# Patient Record
Sex: Female | Born: 1940 | Race: White | Hispanic: No | Smoking: Former smoker
Health system: Southern US, Community
[De-identification: ages and names within clinical notes are randomized; demographics above are authoritative.]

## PROBLEM LIST (undated history)

## (undated) DIAGNOSIS — I639 Cerebral infarction, unspecified: Secondary | ICD-10-CM

## (undated) DIAGNOSIS — G20C Parkinsonism, unspecified: Secondary | ICD-10-CM

## (undated) DIAGNOSIS — E0590 Thyrotoxicosis, unspecified without thyrotoxic crisis or storm: Secondary | ICD-10-CM

## (undated) DIAGNOSIS — E785 Hyperlipidemia, unspecified: Secondary | ICD-10-CM

## (undated) DIAGNOSIS — C801 Malignant (primary) neoplasm, unspecified: Secondary | ICD-10-CM

## (undated) DIAGNOSIS — I10 Essential (primary) hypertension: Secondary | ICD-10-CM

## (undated) DIAGNOSIS — E119 Type 2 diabetes mellitus without complications: Secondary | ICD-10-CM

## (undated) DIAGNOSIS — I499 Cardiac arrhythmia, unspecified: Secondary | ICD-10-CM

## (undated) DIAGNOSIS — M1990 Unspecified osteoarthritis, unspecified site: Secondary | ICD-10-CM

## (undated) DIAGNOSIS — F410 Panic disorder [episodic paroxysmal anxiety] without agoraphobia: Secondary | ICD-10-CM

## (undated) DIAGNOSIS — F419 Anxiety disorder, unspecified: Secondary | ICD-10-CM

## (undated) DIAGNOSIS — F329 Major depressive disorder, single episode, unspecified: Secondary | ICD-10-CM

## (undated) DIAGNOSIS — G20 Parkinson's disease: Secondary | ICD-10-CM

## (undated) DIAGNOSIS — G709 Myoneural disorder, unspecified: Secondary | ICD-10-CM

## (undated) DIAGNOSIS — F32A Depression, unspecified: Secondary | ICD-10-CM

## (undated) HISTORY — PX: EYE SURGERY: SHX253

## (undated) HISTORY — DX: Major depressive disorder, single episode, unspecified: F32.9

## (undated) HISTORY — DX: Anxiety disorder, unspecified: F41.9

## (undated) HISTORY — DX: Depression, unspecified: F32.A

## (undated) HISTORY — DX: Panic disorder (episodic paroxysmal anxiety): F41.0

---

## 1999-03-04 DIAGNOSIS — I639 Cerebral infarction, unspecified: Secondary | ICD-10-CM

## 1999-03-04 HISTORY — DX: Cerebral infarction, unspecified: I63.9

## 2000-08-04 DIAGNOSIS — Z471 Aftercare following joint replacement surgery: Secondary | ICD-10-CM | POA: Insufficient documentation

## 2014-01-09 DIAGNOSIS — M1990 Unspecified osteoarthritis, unspecified site: Secondary | ICD-10-CM | POA: Insufficient documentation

## 2015-02-05 ENCOUNTER — Encounter: Payer: Self-pay | Admitting: Emergency Medicine

## 2015-02-05 ENCOUNTER — Emergency Department
Admission: EM | Admit: 2015-02-05 | Discharge: 2015-02-05 | Disposition: A | Discharge status: Home / Self Care | Payer: Medicare Other | Attending: Emergency Medicine | Admitting: Emergency Medicine

## 2015-02-05 DIAGNOSIS — E119 Type 2 diabetes mellitus without complications: Secondary | ICD-10-CM | POA: Insufficient documentation

## 2015-02-05 DIAGNOSIS — F17210 Nicotine dependence, cigarettes, uncomplicated: Secondary | ICD-10-CM | POA: Insufficient documentation

## 2015-02-05 DIAGNOSIS — R040 Epistaxis: Secondary | ICD-10-CM | POA: Insufficient documentation

## 2015-02-05 HISTORY — DX: Type 2 diabetes mellitus without complications: E11.9

## 2015-02-05 HISTORY — DX: Cerebral infarction, unspecified: I63.9

## 2015-02-05 MED ORDER — ACETAMINOPHEN 500 MG PO TABS
ORAL_TABLET | ORAL | Status: AC
  Administered 2015-02-05: 1000 mg via ORAL
  Filled 2015-02-05: qty 2

## 2015-02-05 MED ORDER — ALPRAZOLAM 0.5 MG PO TABS
ORAL_TABLET | ORAL | Status: AC
  Administered 2015-02-05: 1 mg via ORAL
  Filled 2015-02-05: qty 2

## 2015-02-05 MED ORDER — BUTALBITAL-APAP-CAFFEINE 50-325-40 MG PO TABS: 1.0000 | ORAL_TABLET | Freq: Once | ORAL | Status: DC

## 2015-02-05 MED ORDER — ALPRAZOLAM 0.5 MG PO TABS
1.0000 mg | ORAL_TABLET | Freq: Once | ORAL | Status: AC
  Administered 2015-02-05: 1 mg via ORAL

## 2015-02-05 MED ORDER — LORAZEPAM 0.5 MG PO TABS: 0.5000 mg | ORAL_TABLET | Freq: Once | ORAL | Status: DC

## 2015-02-05 MED ORDER — LIDOCAINE HCL 4 % EX SOLN
Freq: Once | CUTANEOUS | Status: AC
  Administered 2015-02-05: 06:00:00 via TOPICAL

## 2015-02-05 MED ORDER — ACETAMINOPHEN 500 MG PO TABS
1000.0000 mg | ORAL_TABLET | Freq: Once | ORAL | Status: AC
  Administered 2015-02-05: 1000 mg via ORAL

## 2015-02-05 MED ORDER — CEPHALEXIN 500 MG PO CAPS: 500.0000 mg | ORAL_CAPSULE | Freq: Four times a day (QID) | ORAL | Status: AC

## 2015-02-05 MED ORDER — OXYMETAZOLINE HCL 0.05 % NA SOLN
1.0000 | Freq: Once | NASAL | Status: AC
  Administered 2015-02-05: 1 via NASAL

## 2015-02-05 NOTE — ED Notes (Signed)
Dr Dahlia Client at bedside to examine pt; nosebleed cart at bedside

## 2015-02-05 NOTE — ED Provider Notes (Signed)
[facility] Emergency Department Provider Note  ____________________________________________  Time seen: Approximately [id number] AM  I have reviewed the triage vital signs and the nursing notes.   HISTORY  Chief Complaint Epistaxis    HPI Adriana Sanders is a 74 y.o. female who comes into the hospital with epistaxis. The patient reports that she woke up and thought that hertripping. She reports that when she touches her nose there was blood everywhere. The patient reports that she is on Aggrenox and this is never happened before. The patient denies any vomiting blood but feels as though there is blood going down the back of the throat. The patient's bleeding mainly from her left nare although she has some occasional dripping from the right side.The patient and her daughter were concerned so they decided to come in for evaluation and treatment of her nosebleed. The patient is not in any pain at this time, she denies any trauma, she does not have any headache or blurry vision.   Past Medical History  Diagnosis Date  . Stroke ([place])   . Diabetes mellitus without complication ([place])     There are no active problems to display for this patient.   History reviewed. No pertinent past surgical history.  Current Outpatient Rx  Name  Route  Sig  Dispense  Refill  . cephALEXin (KEFLEX) 500 MG capsule   Oral   Take 1 capsule (500 mg total) by mouth 4 (four) times daily.   20 capsule   0        aggrenox  Allergies Vicodin  No family history on file.  Social History Social History  Substance Use Topics  . Smoking status: Current Every Day Smoker -- 1.00 packs/day    Types: Cigarettes  . Smokeless tobacco: None  . Alcohol Use: No    Review of Systems Constitutional: No fever/chills Eyes: No visual changes. ENT: Nose bleed Cardiovascular: Denies chest pain. Respiratory: Denies shortness of breath. Gastrointestinal: No abdominal pain.  No nausea, no vomiting.   No diarrhea.  No constipation. Genitourinary: Negative for dysuria. Musculoskeletal: Negative for back pain. Skin: Negative for rash. Neurological: Negative for headaches, focal weakness or numbness.  10-point ROS otherwise negative.  ____________________________________________   PHYSICAL EXAM:  VITAL SIGNS: ED Triage Vitals  Enc Vitals Group     BP 02/05/15 0607 150/91 mmHg     Pulse Rate 02/05/15 0607 65     Resp 02/05/15 0607 18     Temp 02/05/15 0607 97.6 F (36.4 C)     Temp Source 02/05/15 0607 Oral     SpO2 02/05/15 0607 98 %     Weight 02/05/15 0607 147 lb (66.679 kg)     Height 02/05/15 0607 5\' 2"  (1.575 m)     Head Cir --      Peak Flow --      Pain Score --      Pain Loc --      Pain Edu? --      Excl. in [place]? --     Constitutional: Alert and oriented. Well appearing and in moderate distress. Eyes: Conjunctivae are normal. PERRL. EOMI. Head: Atraumatic. Nose: No congestion/rhinnorhea. Mouth/Throat: Mucous membranes are moist.  Oropharynx non-erythematous. Cardiovascular: Normal rate, regular rhythm. Grossly normal heart sounds.  Good peripheral circulation. Respiratory: Normal respiratory effort.  No retractions. Lungs CTAB. Gastrointestinal: Soft and nontender. No distention. Positive bowel sounds Musculoskeletal: No lower extremity tenderness nor edema.   Neurologic:  Normal speech and language.  Skin:  Skin is warm, dry and intact.  Psychiatric: Mood and affect are normal.   ____________________________________________   LABS (all labs ordered are listed, but only abnormal results are displayed)  Labs Reviewed - No data to display ____________________________________________  EKG  None ____________________________________________  RADIOLOGY  None ____________________________________________   PROCEDURES  Procedure(s) performed: None   Critical Care performed: No  ____________________________________________   INITIAL IMPRESSION  / ASSESSMENT AND PLAN / ED COURSE  Pertinent labs & imaging results that were available during my care of the patient were reviewed by me and considered in my medical decision making (see chart for details).  When the patient arrived to the emergency department she was still having some significant bleeding from her left nare. I placed some Afrin into the patient's nose and then some 4% lidocaine. I did place a murocel into the patient's left nare without any complication. The patient remained in the emergency department for 45 minutes without any worse bleeding. The patient did receive xanax for anxiety and tylenol for headache. She reports that she feels better and is ready to go home. The patient will be discharged to home  To take her blood pressure medication.  ____________________________________________   FINAL CLINICAL IMPRESSION(S) / ED DIAGNOSES  Final diagnoses:  Epistaxis      Loney Hering, MD 02/05/15 [phone]

## 2015-02-05 NOTE — ED Notes (Addendum)
Report received 

## 2015-02-05 NOTE — ED Notes (Signed)
Pt arrived with active nosebleed; clip applied and pt given clean gauze; hands cleaned well with wipes; bleeding slowed after clip placed; daughter says nosebleed active for 40 minutes; had stopped until pt blew her nose and removed a large clot

## 2015-02-05 NOTE — Discharge Instructions (Signed)

## 2015-02-05 NOTE — ED Notes (Signed)
Pt reports a generalized HA 3/10 now; MD aware and pt requests tylenol for relief; also expresses some nervousness and st normally takes 1mg  xanax; MD aware and meds ordered

## 2015-02-05 NOTE — ED Notes (Signed)
Nasal packing completed to left nare; pt tolerated well

## 2015-02-05 NOTE — ED Notes (Addendum)
Pt ambulatory to room 6 without difficulty or distress noted; pt reports nosebleed from left nare last 15minutes; denies hx of same; denies accomp symptoms at present; reports recent sinus dryness and ear pressure and currently taking aggrenox

## 2017-04-22 ENCOUNTER — Emergency Department: Payer: Medicare Other

## 2017-04-22 ENCOUNTER — Encounter: Payer: Self-pay | Admitting: Emergency Medicine

## 2017-04-22 ENCOUNTER — Other Ambulatory Visit: Payer: Self-pay

## 2017-04-22 ENCOUNTER — Inpatient Hospital Stay
Admission: EM | Admit: 2017-04-22 | Discharge: 2017-04-25 | DRG: 470 | Disposition: A | Discharge status: Skilled Nursing Facility | Payer: Medicare Other | Attending: Internal Medicine | Admitting: Internal Medicine

## 2017-04-22 DIAGNOSIS — W19XXXA Unspecified fall, initial encounter: Secondary | ICD-10-CM

## 2017-04-22 DIAGNOSIS — Z885 Allergy status to narcotic agent status: Secondary | ICD-10-CM

## 2017-04-22 DIAGNOSIS — W010XXA Fall on same level from slipping, tripping and stumbling without subsequent striking against object, initial encounter: Secondary | ICD-10-CM | POA: Diagnosis present

## 2017-04-22 DIAGNOSIS — Z7901 Long term (current) use of anticoagulants: Secondary | ICD-10-CM

## 2017-04-22 DIAGNOSIS — E876 Hypokalemia: Secondary | ICD-10-CM | POA: Diagnosis not present

## 2017-04-22 DIAGNOSIS — Y92009 Unspecified place in unspecified non-institutional (private) residence as the place of occurrence of the external cause: Secondary | ICD-10-CM

## 2017-04-22 DIAGNOSIS — I10 Essential (primary) hypertension: Secondary | ICD-10-CM | POA: Diagnosis present

## 2017-04-22 DIAGNOSIS — S72002A Fracture of unspecified part of neck of left femur, initial encounter for closed fracture: Principal | ICD-10-CM | POA: Diagnosis present

## 2017-04-22 DIAGNOSIS — F17210 Nicotine dependence, cigarettes, uncomplicated: Secondary | ICD-10-CM | POA: Diagnosis present

## 2017-04-22 DIAGNOSIS — Z7984 Long term (current) use of oral hypoglycemic drugs: Secondary | ICD-10-CM

## 2017-04-22 DIAGNOSIS — Z8673 Personal history of transient ischemic attack (TIA), and cerebral infarction without residual deficits: Secondary | ICD-10-CM | POA: Diagnosis not present

## 2017-04-22 DIAGNOSIS — D62 Acute posthemorrhagic anemia: Secondary | ICD-10-CM | POA: Diagnosis not present

## 2017-04-22 DIAGNOSIS — Z79899 Other long term (current) drug therapy: Secondary | ICD-10-CM

## 2017-04-22 DIAGNOSIS — Z791 Long term (current) use of non-steroidal anti-inflammatories (NSAID): Secondary | ICD-10-CM | POA: Diagnosis not present

## 2017-04-22 DIAGNOSIS — Z419 Encounter for procedure for purposes other than remedying health state, unspecified: Secondary | ICD-10-CM

## 2017-04-22 DIAGNOSIS — E119 Type 2 diabetes mellitus without complications: Secondary | ICD-10-CM | POA: Diagnosis present

## 2017-04-22 DIAGNOSIS — G8918 Other acute postprocedural pain: Secondary | ICD-10-CM

## 2017-04-22 DIAGNOSIS — E785 Hyperlipidemia, unspecified: Secondary | ICD-10-CM | POA: Diagnosis present

## 2017-04-22 HISTORY — DX: Hyperlipidemia, unspecified: E78.5

## 2017-04-22 HISTORY — DX: Essential (primary) hypertension: I10

## 2017-04-22 LAB — URINALYSIS, COMPLETE (UACMP) WITH MICROSCOPIC
Bacteria, UA: NONE SEEN
Bilirubin Urine: NEGATIVE
Glucose, UA: NEGATIVE mg/dL
Hgb urine dipstick: NEGATIVE
Ketones, ur: NEGATIVE mg/dL
Leukocytes, UA: NEGATIVE
Nitrite: NEGATIVE
Protein, ur: NEGATIVE mg/dL
Specific Gravity, Urine: 1.019 (ref 1.005–1.030)
pH: 5 (ref 5.0–8.0)

## 2017-04-22 LAB — CBC WITH DIFFERENTIAL/PLATELET
Basophils Absolute: 0.1 10*3/uL (ref 0–0.1)
Basophils Relative: 1 %
Eosinophils Absolute: 0 10*3/uL (ref 0–0.7)
Eosinophils Relative: 0 %
HCT: 42.9 % (ref 35.0–47.0)
Hemoglobin: 14.6 g/dL (ref 12.0–16.0)
Lymphocytes Relative: 8 %
Lymphs Abs: 0.7 10*3/uL — ABNORMAL LOW (ref 1.0–3.6)
MCH: 29.6 pg (ref 26.0–34.0)
MCHC: 34.1 g/dL (ref 32.0–36.0)
MCV: 86.8 fL (ref 80.0–100.0)
Monocytes Absolute: 0.4 10*3/uL (ref 0.2–0.9)
Monocytes Relative: 4 %
Neutro Abs: 8.3 10*3/uL — ABNORMAL HIGH (ref 1.4–6.5)
Neutrophils Relative %: 87 %
Platelets: 175 10*3/uL (ref 150–440)
RBC: 4.94 MIL/uL (ref 3.80–5.20)
RDW: 13.7 % (ref 11.5–14.5)
WBC: 9.6 10*3/uL (ref 3.6–11.0)

## 2017-04-22 LAB — BASIC METABOLIC PANEL
Anion gap: 9 (ref 5–15)
BUN: 18 mg/dL (ref 6–20)
CO2: 29 mmol/L (ref 22–32)
Calcium: 9.7 mg/dL (ref 8.9–10.3)
Chloride: 102 mmol/L (ref 101–111)
Creatinine, Ser: 0.86 mg/dL (ref 0.44–1.00)
GFR calc Af Amer: >60 mL/min (ref >60)
GFR calc non Af Amer: >60 mL/min (ref >60)
Glucose, Bld: 133 mg/dL — ABNORMAL HIGH (ref 65–99)
Potassium: 3.8 mmol/L (ref 3.5–5.1)
Sodium: 140 mmol/L (ref 135–145)

## 2017-04-22 LAB — GLUCOSE, CAPILLARY: Glucose-Capillary: 131 mg/dL — ABNORMAL HIGH (ref 65–99)

## 2017-04-22 LAB — PROTIME-INR
INR: 0.95
Prothrombin Time: 12.6 seconds (ref 11.4–15.2)

## 2017-04-22 LAB — TYPE AND SCREEN
ABO/RH(D): B NEG
Antibody Screen: NEGATIVE

## 2017-04-22 LAB — CK: Total CK: 47 U/L (ref 38–234)

## 2017-04-22 MED ORDER — METOPROLOL TARTRATE 50 MG PO TABS
100.0000 mg | ORAL_TABLET | Freq: Two times a day (BID) | ORAL | Status: DC
  Administered 2017-04-22 – 2017-04-25 (×4): 100 mg via ORAL
  Filled 2017-04-22 (×5): qty 2

## 2017-04-22 MED ORDER — SODIUM CHLORIDE 0.9 % IV SOLN
INTRAVENOUS | Status: DC
  Administered 2017-04-22: 21:00:00 via INTRAVENOUS

## 2017-04-22 MED ORDER — DOCUSATE SODIUM 100 MG PO CAPS
100.0000 mg | ORAL_CAPSULE | Freq: Two times a day (BID) | ORAL | Status: DC
  Administered 2017-04-22 – 2017-04-24 (×3): 100 mg via ORAL
  Filled 2017-04-22 (×5): qty 1

## 2017-04-22 MED ORDER — INSULIN ASPART 100 UNIT/ML ~~LOC~~ SOLN
0.0000 [IU] | Freq: Three times a day (TID) | SUBCUTANEOUS | Status: DC
  Administered 2017-04-23 – 2017-04-25 (×5): 1 [IU] via SUBCUTANEOUS
  Filled 2017-04-22 (×5): qty 1

## 2017-04-22 MED ORDER — VITAMIN B-12 1000 MCG PO TABS
500.0000 ug | ORAL_TABLET | Freq: Every day | ORAL | Status: DC
  Administered 2017-04-24 – 2017-04-25 (×2): 500 ug via ORAL
  Filled 2017-04-22 (×2): qty 1

## 2017-04-22 MED ORDER — ATORVASTATIN CALCIUM 10 MG PO TABS
10.0000 mg | ORAL_TABLET | Freq: Every day | ORAL | Status: DC
  Administered 2017-04-22 – 2017-04-25 (×3): 10 mg via ORAL
  Filled 2017-04-22 (×3): qty 1

## 2017-04-22 MED ORDER — CHLORHEXIDINE GLUCONATE 4 % EX LIQD
Freq: Once | CUTANEOUS | Status: AC
  Administered 2017-04-23: via TOPICAL

## 2017-04-22 MED ORDER — NICOTINE 14 MG/24HR TD PT24
14.0000 mg | MEDICATED_PATCH | Freq: Every day | TRANSDERMAL | Status: DC
  Filled 2017-04-22 (×3): qty 1

## 2017-04-22 MED ORDER — CLINDAMYCIN PHOSPHATE 600 MG/50ML IV SOLN: 600.0000 mg | INTRAVENOUS | Status: AC

## 2017-04-22 MED ORDER — BISACODYL 5 MG PO TBEC
5.0000 mg | DELAYED_RELEASE_TABLET | Freq: Every day | ORAL | Status: DC | PRN
  Administered 2017-04-24: 5 mg via ORAL
  Filled 2017-04-22: qty 1

## 2017-04-22 MED ORDER — HYDROCHLOROTHIAZIDE 25 MG PO TABS
25.0000 mg | ORAL_TABLET | Freq: Every day | ORAL | Status: DC
  Administered 2017-04-23 – 2017-04-25 (×3): 25 mg via ORAL
  Filled 2017-04-22 (×3): qty 1

## 2017-04-22 MED ORDER — CEFAZOLIN SODIUM-DEXTROSE 2-4 GM/100ML-% IV SOLN
2.0000 g | INTRAVENOUS | Status: AC
  Administered 2017-04-23: 2 g via INTRAVENOUS

## 2017-04-22 MED ORDER — KETOROLAC TROMETHAMINE 15 MG/ML IJ SOLN
15.0000 mg | Freq: Four times a day (QID) | INTRAMUSCULAR | Status: DC | PRN
  Administered 2017-04-22 – 2017-04-24 (×5): 15 mg via INTRAVENOUS
  Filled 2017-04-22 (×5): qty 1

## 2017-04-22 MED ORDER — SENNOSIDES-DOCUSATE SODIUM 8.6-50 MG PO TABS: 1.0000 | ORAL_TABLET | Freq: Every evening | ORAL | Status: DC | PRN

## 2017-04-22 MED ORDER — TRAZODONE HCL 100 MG PO TABS
100.0000 mg | ORAL_TABLET | Freq: Every day | ORAL | Status: DC
  Administered 2017-04-22 – 2017-04-24 (×2): 100 mg via ORAL
  Filled 2017-04-22 (×3): qty 1

## 2017-04-22 MED ORDER — ALBUTEROL SULFATE (2.5 MG/3ML) 0.083% IN NEBU: 2.5000 mg | INHALATION_SOLUTION | RESPIRATORY_TRACT | Status: DC | PRN

## 2017-04-22 MED ORDER — METHIMAZOLE 5 MG PO TABS
5.0000 mg | ORAL_TABLET | Freq: Every day | ORAL | Status: DC
  Administered 2017-04-24 – 2017-04-25 (×2): 5 mg via ORAL
  Filled 2017-04-22 (×3): qty 1

## 2017-04-22 MED ORDER — FLUOXETINE HCL 20 MG PO CAPS
40.0000 mg | ORAL_CAPSULE | Freq: Every day | ORAL | Status: DC
  Administered 2017-04-24 – 2017-04-25 (×2): 40 mg via ORAL
  Filled 2017-04-22 (×3): qty 2

## 2017-04-22 MED ORDER — ONDANSETRON HCL 4 MG/2ML IJ SOLN: 4.0000 mg | Freq: Four times a day (QID) | INTRAMUSCULAR | Status: DC | PRN

## 2017-04-22 MED ORDER — LISINOPRIL-HYDROCHLOROTHIAZIDE 20-25 MG PO TABS: 1.0000 | ORAL_TABLET | Freq: Every day | ORAL | Status: DC

## 2017-04-22 MED ORDER — FLUTICASONE PROPIONATE 50 MCG/ACT NA SUSP
2.0000 | Freq: Every day | NASAL | Status: DC | PRN
  Filled 2017-04-22: qty 16

## 2017-04-22 MED ORDER — ALPRAZOLAM 1 MG PO TABS: 1.0000 mg | ORAL_TABLET | Freq: Every day | ORAL | Status: DC | PRN

## 2017-04-22 MED ORDER — FENTANYL CITRATE (PF) 100 MCG/2ML IJ SOLN
50.0000 ug | Freq: Once | INTRAMUSCULAR | Status: AC
Start: 2017-04-22 — End: 2017-04-22
  Administered 2017-04-22: 50 ug via INTRAVENOUS
  Filled 2017-04-22: qty 2

## 2017-04-22 MED ORDER — ONDANSETRON HCL 4 MG PO TABS: 4.0000 mg | ORAL_TABLET | Freq: Four times a day (QID) | ORAL | Status: DC | PRN

## 2017-04-22 MED ORDER — INSULIN ASPART 100 UNIT/ML ~~LOC~~ SOLN: 0.0000 [IU] | Freq: Every day | SUBCUTANEOUS | Status: DC

## 2017-04-22 MED ORDER — LISINOPRIL 20 MG PO TABS
20.0000 mg | ORAL_TABLET | Freq: Every day | ORAL | Status: DC
  Administered 2017-04-23 – 2017-04-25 (×3): 20 mg via ORAL
  Filled 2017-04-22 (×3): qty 1

## 2017-04-22 NOTE — ED Provider Notes (Addendum)
[name] Emergency Department Provider Note  ____________________________________________   I have reviewed the triage vital signs and the nursing notes. Where available I have reviewed prior notes and, if possible and indicated, outside hospital notes.    HISTORY  Chief Complaint Fall    HPI Adriana Sanders is a 77 y.o. female who has a history of diabetes mellitus, she was in her normal state of health when she tripped and fell, he did not pass out.  She states that she has had significant pain in her left leg mostly in her hip and also in her left knee.  It did not believe she hit her head.  She is on Aggrenox however.  Patient states she had no syncopal event.  The pain is mostly in the left leg, worse when she moves it or changes position.  She has not been able to bear weight since she fell.     Past Medical History:  Diagnosis Date  . Diabetes mellitus without complication ([place])   . Stroke [facility])     There are no active problems to display for this patient.   History reviewed. No pertinent surgical history.  Prior to Admission medications   Not on File    Allergies Vicodin [hydrocodone-acetaminophen]  History reviewed. No pertinent family history.  Social History Social History   Tobacco Use  . Smoking status: Current Every Day Smoker    Packs/day: 1.00    Types: Cigarettes  Substance Use Topics  . Alcohol use: No  . Drug use: Not on file    Review of Systems Constitutional: No fever/chills Eyes: No visual changes. ENT: No sore throat. No stiff neck no neck pain Cardiovascular: Denies chest pain. Respiratory: Denies shortness of breath. Gastrointestinal:   no vomiting.  No diarrhea.  No constipation. Genitourinary: Negative for dysuria. Musculoskeletal: Negative lower extremity swelling Skin: Negative for rash. Neurological: Negative for severe headaches, focal weakness or  numbness.   ____________________________________________   PHYSICAL EXAM:  VITAL SIGNS: ED Triage Vitals  Enc Vitals Group     BP 04/22/17 1820 (!) 166/93     Pulse Rate 04/22/17 1820 76     Resp --      Temp 04/22/17 1820 97.9 F (36.6 C)     Temp Source 04/22/17 1820 Oral     SpO2 04/22/17 1820 93 %     Weight 04/22/17 1826 150 lb (68 kg)     Height 04/22/17 1826 5\' 2"  (1.575 m)     Head Circumference --      Peak Flow --      Pain Score 04/22/17 1826 8     Pain Loc --      Pain Edu? --      Excl. in [place]? --     Constitutional: Alert and oriented. Well appearing and in no acute distress. Eyes: Conjunctivae are normal Head: Atraumatic HEENT: No congestion/rhinnorhea. Mucous membranes are moist.  Oropharynx non-erythematous Neck:   Nontender with no meningismus, no masses, no stridor Cardiovascular: Normal rate, regular rhythm. Grossly normal heart sounds.  Good peripheral circulation. Respiratory: Normal respiratory effort.  No retractions. Lungs CTAB. Abdominal: Soft and nontender. No distention. No guarding no rebound Back:  There is no focal tenderness or step off.  there is no midline tenderness there are no lesions noted. there is no CVA tenderness Musculoskeletal: There is tenderness to palpation in the left hip especially with range of motion, there is also some pain to palpation and tenderness to  the left knee, no obvious deformity noted.  Very difficult to tease out whether the knee hurts when I range the knee because when range range the knee I also range the hip.  Neurologically intact, good pulses no upper extremity injury noted.   Neurologic:  Normal speech and language. No gross focal neurologic deficits are appreciated.  Skin:  Skin is warm, dry and intact. No rash noted. Psychiatric: Mood and affect are normal. Speech and behavior are normal.  ____________________________________________   LABS (all labs ordered are listed, but only abnormal results are  displayed)  Labs Reviewed - No data to display  Pertinent labs  results that were available during my care of the patient were reviewed by me and considered in my medical decision making (see chart for details). ____________________________________________  EKG  I personally interpreted any EKGs ordered by me or triage  ____________________________________________  RADIOLOGY  Pertinent labs & imaging results that were available during my care of the patient were reviewed by me and considered in my medical decision making (see chart for details). If possible, patient and/or family made aware of any abnormal findings.  No results found. ____________________________________________    PROCEDURES  Procedure(s) performed: None  Procedures  Critical Care performed: None  ____________________________________________   INITIAL IMPRESSION / ASSESSMENT AND PLAN / ED COURSE  Pertinent labs & imaging results that were available during my care of the patient were reviewed by me and considered in my medical decision making (see chart for details).  She with a non-syncopal fall today, has hip and knee pain.  Concern exists for fracture.  Most likely hip but will also obtain x-ray of the knee.  We will give her fentanyl.  She is not allergic to this medication.  She states that she had a stroke shortly after taking Vicodin 15 years ago and therefore she has avoided Vicodin.  This is not a true allergy.  Never had any anaphylactic or other symptoms with that.  We will give her fentanyl as she is clearly uncomfortable, and we will reassess.  ----------------------------------------- 7:52 PM on 04/22/2017 -----------------------------------------  Much more comfortable, we are admitting her to the medicine service for her broken hip I did discuss with Dr. Earnestine Leys who looked at x-rays with me.  I personally reviewed x-rays as well.     ____________________________________________   FINAL CLINICAL IMPRESSION(S) / ED DIAGNOSES  Final diagnoses:  None      This chart was dictated using voice recognition software.  Despite best efforts to proofread,  errors can occur which can change meaning.      Schuyler Amor, MD 04/22/17 Velta Addison    Schuyler Amor, MD 04/22/17 [phone]

## 2017-04-22 NOTE — ED Triage Notes (Signed)
Pt ems from daughters home s/p fall. Per pt she tripped over feet and fell on carpet on left hip and was on floor approx 2.5 hours. Pt denies syncope. C/o left hip pain shooting down left leg. 8/10

## 2017-04-22 NOTE — H&P (Addendum)
[place] [facility] [place] NAME: Adriana Sanders    MR#:  [id number]  DATE OF BIRTH:  Nov 17, 1940  DATE OF ADMISSION:  04/22/2017  PRIMARY CARE PHYSICIAN: Patient, No Pcp Per   REQUESTING/REFERRING PHYSICIAN: Dr. Burlene Arnt  CHIEF COMPLAINT:   Chief Complaint  Patient presents with  . Fall   Fall in the left hip pain today. HISTORY OF PRESENT ILLNESS:  Adriana Sanders  is a 77 y.o. female with a known history of hypertension, hyperlipidemia, diabetes and stroke.  The patient fell by accident today and injured left hip with hip pain.  She denies any syncope, headache or dizziness.  She was found left hip fracture.  PAST MEDICAL HISTORY:   Past Medical History:  Diagnosis Date  . Diabetes mellitus without complication ([place])   . Hyperlipemia   . Hypertension   . Stroke [facility])     PAST SURGICAL HISTORY:  History reviewed. No pertinent surgical history.  SOCIAL HISTORY:   Social History   Tobacco Use  . Smoking status: Current Every Day Smoker    Packs/day: 1.00    Types: Cigarettes  Substance Use Topics  . Alcohol use: No    FAMILY HISTORY:  History reviewed. No pertinent family history. HTN.  DRUG ALLERGIES:   Allergies  Allergen Reactions  . Vicodin [Hydrocodone-Acetaminophen]     REVIEW OF SYSTEMS:   Review of Systems  Constitutional: Negative for chills, fever and malaise/fatigue.  HENT: Negative for sore throat.   Eyes: Negative for blurred vision and double vision.  Respiratory: Negative for cough, hemoptysis, shortness of breath, wheezing and stridor.   Cardiovascular: Negative for chest pain, palpitations, orthopnea and leg swelling.  Gastrointestinal: Negative for abdominal pain, blood in stool, diarrhea, melena, nausea and vomiting.  Genitourinary: Negative for dysuria, flank pain and hematuria.  Musculoskeletal: Positive for joint pain. Negative for back pain.  Skin: Negative for rash.  Neurological: Negative for  dizziness, sensory change, focal weakness, seizures, loss of consciousness, weakness and headaches.  Endo/Heme/Allergies: Negative for polydipsia.  Psychiatric/Behavioral: Negative for depression. The patient is not nervous/anxious.     MEDICATIONS AT HOME:   Prior to Admission medications   Medication Sig Start Date End Date Taking? Authorizing Provider  AGGRENOX 25-200 MG 12hr capsule Take 1 capsule by mouth 2 (two) times daily. 02/25/17  Yes [provider]  ALPRAZolam Duanne Moron) 1 MG tablet Take 1 mg by mouth daily as needed. 04/13/17  Yes [provider]  atorvastatin (LIPITOR) 10 MG tablet Take 10 mg by mouth daily. 02/13/17  Yes [provider]  celecoxib (CELEBREX) 200 MG capsule Take 200 mg by mouth daily as needed.   Yes [provider]  FLUoxetine (PROZAC) 40 MG capsule Take 40 mg by mouth daily. 02/13/17  Yes [provider]  fluticasone (FLONASE) 50 MCG/ACT nasal spray Place 2 sprays into both nostrils daily.   Yes [provider]  lisinopril-hydrochlorothiazide (PRINZIDE,ZESTORETIC) 20-25 MG tablet Take 1 tablet by mouth daily.   Yes [provider]  metFORMIN (GLUCOPHAGE) 500 MG tablet Take 500 mg by mouth 2 (two) times daily with a meal.   Yes [provider]  methimazole (TAPAZOLE) 5 MG tablet Take 5 mg by mouth daily.   Yes [provider]  metoprolol tartrate (LOPRESSOR) 100 MG tablet Take 100 mg by mouth 2 (two) times daily with a meal. 02/13/17  Yes [provider]  traZODone (DESYREL) 100 MG tablet Take 100 mg by mouth at  bedtime. 02/13/17  Yes [provider]  vitamin B-12 (CYANOCOBALAMIN) 500 MCG tablet Take 500 mcg by mouth daily.   Yes [provider]      VITAL SIGNS:  Blood pressure (!) 173/73, pulse 64, temperature 97.9 F (36.6 C), temperature source Oral, height 5\' 2"  (1.575 m), weight 150 lb (68 kg), SpO2 97 %.  PHYSICAL EXAMINATION:  Physical  Exam  GENERAL:  77 y.o.-year-old patient lying in the bed with no acute distress.  EYES: Pupils equal, round, reactive to light and accommodation. No scleral icterus. Extraocular muscles intact.  HEENT: Head atraumatic, normocephalic. Oropharynx and nasopharynx clear.  NECK:  Supple, no jugular venous distention. No thyroid enlargement, no tenderness.  LUNGS: Normal breath sounds bilaterally, no wheezing, rales,rhonchi or crepitation. No use of accessory muscles of respiration.  CARDIOVASCULAR: S1, S2 normal. No murmurs, rubs, or gallops.  ABDOMEN: Soft, nontender, nondistended. Bowel sounds present. No organomegaly or mass.  EXTREMITIES: No pedal edema, cyanosis, or clubbing.  NEUROLOGIC: Cranial nerves II through XII are intact. Muscle strength 5/5 in all extremities except left leg. Sensation intact. Gait not checked.  PSYCHIATRIC: The patient is alert and oriented x 3.  SKIN: No obvious rash, lesion, or ulcer.   LABORATORY PANEL:   CBC Recent Labs  Lab 04/22/17 1842  WBC 9.6  HGB 14.6  HCT 42.9  PLT 175   ------------------------------------------------------------------------------------------------------------------  Chemistries  Recent Labs  Lab 04/22/17 1842  NA 140  K 3.8  CL 102  CO2 29  GLUCOSE 133*  BUN 18  CREATININE 0.86  CALCIUM 9.7   ------------------------------------------------------------------------------------------------------------------  Cardiac Enzymes No results for input(s): TROPONINI in the last 168 hours. ------------------------------------------------------------------------------------------------------------------  RADIOLOGY:  Ct Head Wo Contrast  Result Date: 04/22/2017 CLINICAL DATA:  Fall at home. EXAM: CT HEAD WITHOUT CONTRAST TECHNIQUE: Contiguous axial images were obtained from the base of the skull through the vertex without intravenous contrast. COMPARISON:  None. FINDINGS: Brain: Mild chronic ischemic white matter disease is  noted. No mass effect or midline shift is noted. Ventricular size is within normal limits. There is no evidence of mass lesion, hemorrhage or acute infarction. Vascular: No hyperdense vessel or unexpected calcification. Skull: Normal. Negative for fracture or focal lesion. Sinuses/Orbits: Bilateral ethmoid and maxillary sinusitis is noted. Other: None. IMPRESSION: Mild chronic ischemic white matter disease. No acute intracranial abnormality seen. Electronically Signed   By: Marijo Conception, M.D.   On: 04/22/2017 19:05   Dg Chest Portable 1 View  Result Date: 04/22/2017 CLINICAL DATA:  Hip fracture EXAM: PORTABLE CHEST 1 VIEW COMPARISON:  None. FINDINGS: Asymmetric opacity in the left thorax felt related to overlying soft tissue. No acute pulmonary infiltrate or effusion. Cardiomediastinal silhouette within normal limits. Mild aortic atherosclerosis. No pneumothorax. IMPRESSION: No active disease. Electronically Signed   By: Donavan Foil M.D.   On: 04/22/2017 19:42   Dg Knee Complete 4 Views Left  Result Date: 04/22/2017 CLINICAL DATA:  Fall with hip pain EXAM: LEFT KNEE - COMPLETE 4+ VIEW COMPARISON:  None. FINDINGS: No acute displaced fracture or malalignment is seen. Mild patellofemoral degenerative changes. Moderate degenerative changes of the medial compartment and mild degenerative changes of the lateral compartment. Joint space calcifications. No significant effusion. Popliteal vascular calcification IMPRESSION: 1. No acute osseous abnormality 2. 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   On: 04/22/2017 19:42   Dg Hip Unilat W Or Wo Pelvis 2-3 Views Left  Result Date: 04/22/2017 CLINICAL  DATA:  Hip pain after fall EXAM: DG HIP (WITH OR WITHOUT PELVIS) 2-3V LEFT COMPARISON:  None. FINDINGS: Mild SI joint degenerative changes. Pubic symphysis and rami are intact. The right femoral head projects in joint. Acute minimally displaced left  femoral neck fracture. Femoral head alignment is within normal limits. IMPRESSION: 1. Acute left femoral neck fracture.  No femoral head dislocation. Electronically Signed   By: Donavan Foil M.D.   On: 04/22/2017 19:41      IMPRESSION AND PLAN:   Left hip fracture. The patient will be admitted to medical floor. The patient has moderate risk for hip surgery.  The patient's daughter prefers Dr. Rudene Christians as orthopedic surgeon. Follow-up orthopedic surgeon for surgery. Hold Lovenox, continue Lopressor. PT and DVT prophylaxis after surgery.  Hypertension.  Continue hypertension medication.  Diabetes.  Start sliding scale.  Hold metformin. History of CVA.  Hold Aggrenox for now, continue statin. Tobacco abuse.  Smoking cessation was counseled for 3-4 minutes, nicotine patch.  All the records are reviewed and case discussed with ED provider. Management plans discussed with the patient, her daughter and they are in agreement.  CODE STATUS: Full code  TOTAL TIME TAKING CARE OF THIS PATIENT: 58 minutes.    Demetrios Loll M.D on 04/22/2017 at 8:41 PM  Between 7am to 6pm - Pager - [phone]  After 6pm go to [email or web address] - Proofreader  [facility] [place] [facility]  Office  [phone]  CC: Primary care physician; Patient, No Pcp Per   Note: This dictation was prepared with Dragon dictation along with smaller phrase technology. Any transcriptional errors that result from this process are unin

## 2017-04-22 NOTE — ED Notes (Addendum)
Daughter's number is Alfredo Batty [phone] (cell)

## 2017-04-23 ENCOUNTER — Encounter
Admission: EM | Disposition: A | Discharge status: Skilled Nursing Facility | Payer: Self-pay | Source: Home / Self Care | Attending: Internal Medicine

## 2017-04-23 ENCOUNTER — Inpatient Hospital Stay: Payer: Medicare Other

## 2017-04-23 ENCOUNTER — Inpatient Hospital Stay: Payer: Medicare Other | Admitting: Anesthesiology

## 2017-04-23 DIAGNOSIS — Z96642 Presence of left artificial hip joint: Secondary | ICD-10-CM | POA: Insufficient documentation

## 2017-04-23 DIAGNOSIS — Z87310 Personal history of (healed) osteoporosis fracture: Secondary | ICD-10-CM | POA: Insufficient documentation

## 2017-04-23 HISTORY — PX: ANTERIOR APPROACH HEMI HIP ARTHROPLASTY: SHX6690

## 2017-04-23 LAB — CBC
HCT: 39.2 % (ref 35.0–47.0)
Hemoglobin: 13.3 g/dL (ref 12.0–16.0)
MCH: 29.6 pg (ref 26.0–34.0)
MCHC: 34.1 g/dL (ref 32.0–36.0)
MCV: 86.9 fL (ref 80.0–100.0)
Platelets: 166 10*3/uL (ref 150–440)
RBC: 4.51 MIL/uL (ref 3.80–5.20)
RDW: 13.6 % (ref 11.5–14.5)
WBC: 6.5 10*3/uL (ref 3.6–11.0)

## 2017-04-23 LAB — GLUCOSE, CAPILLARY
Glucose-Capillary: 123 mg/dL — ABNORMAL HIGH (ref 65–99)
Glucose-Capillary: 123 mg/dL — ABNORMAL HIGH (ref 65–99)
Glucose-Capillary: 94 mg/dL (ref 65–99)
Glucose-Capillary: 108 mg/dL — ABNORMAL HIGH (ref 65–99)

## 2017-04-23 LAB — MRSA PCR SCREENING: MRSA by PCR: NEGATIVE

## 2017-04-23 LAB — BASIC METABOLIC PANEL
Anion gap: 8 (ref 5–15)
BUN: 18 mg/dL (ref 6–20)
CO2: 26 mmol/L (ref 22–32)
Calcium: 9.2 mg/dL (ref 8.9–10.3)
Chloride: 104 mmol/L (ref 101–111)
Creatinine, Ser: 0.96 mg/dL (ref 0.44–1.00)
GFR calc Af Amer: >60 mL/min (ref >60)
GFR calc non Af Amer: 56 mL/min — ABNORMAL LOW (ref >60)
Glucose, Bld: 131 mg/dL — ABNORMAL HIGH (ref 65–99)
Potassium: 3.5 mmol/L (ref 3.5–5.1)
Sodium: 138 mmol/L (ref 135–145)

## 2017-04-23 SURGERY — HEMIARTHROPLASTY, HIP, DIRECT ANTERIOR APPROACH, FOR FRACTURE
Anesthesia: Choice | Laterality: Left

## 2017-04-23 MED ORDER — BUPIVACAINE-EPINEPHRINE 0.25% -1:200000 IJ SOLN
INTRAMUSCULAR | Status: DC | PRN
  Administered 2017-04-23: 30 mL

## 2017-04-23 MED ORDER — OXYCODONE HCL 5 MG PO TABS
10.0000 mg | ORAL_TABLET | ORAL | Status: DC | PRN
  Administered 2017-04-24 – 2017-04-25 (×8): 10 mg via ORAL
  Filled 2017-04-23 (×8): qty 2

## 2017-04-23 MED ORDER — BUPIVACAINE HCL (PF) 0.25 % IJ SOLN
INTRAMUSCULAR | Status: AC
  Filled 2017-04-23: qty 30

## 2017-04-23 MED ORDER — PHENYLEPHRINE HCL 10 MG/ML IJ SOLN
INTRAMUSCULAR | Status: DC | PRN
  Administered 2017-04-23 (×3): 100 ug via INTRAVENOUS

## 2017-04-23 MED ORDER — ACETAMINOPHEN 650 MG RE SUPP: 650.0000 mg | RECTAL | Status: DC | PRN

## 2017-04-23 MED ORDER — PHENOL 1.4 % MT LIQD
1.0000 | OROMUCOSAL | Status: DC | PRN
  Filled 2017-04-23: qty 177

## 2017-04-23 MED ORDER — FENTANYL CITRATE (PF) 100 MCG/2ML IJ SOLN
INTRAMUSCULAR | Status: AC
  Filled 2017-04-23: qty 2

## 2017-04-23 MED ORDER — METHOCARBAMOL 500 MG PO TABS: 500.0000 mg | ORAL_TABLET | Freq: Four times a day (QID) | ORAL | Status: DC | PRN

## 2017-04-23 MED ORDER — METHOCARBAMOL 1000 MG/10ML IJ SOLN
500.0000 mg | Freq: Four times a day (QID) | INTRAMUSCULAR | Status: DC | PRN
  Filled 2017-04-23: qty 5

## 2017-04-23 MED ORDER — FENTANYL CITRATE (PF) 100 MCG/2ML IJ SOLN: 25.0000 ug | INTRAMUSCULAR | Status: DC | PRN

## 2017-04-23 MED ORDER — ZOLPIDEM TARTRATE 5 MG PO TABS: 5.0000 mg | ORAL_TABLET | Freq: Every evening | ORAL | Status: DC | PRN

## 2017-04-23 MED ORDER — METOPROLOL TARTRATE 50 MG PO TABS
100.0000 mg | ORAL_TABLET | Freq: Once | ORAL | Status: AC
  Administered 2017-04-23: 100 mg via ORAL
  Filled 2017-04-23: qty 2

## 2017-04-23 MED ORDER — PROPOFOL 500 MG/50ML IV EMUL
INTRAVENOUS | Status: DC | PRN
  Administered 2017-04-23: 35 ug/kg/min via INTRAVENOUS

## 2017-04-23 MED ORDER — NEOMYCIN-POLYMYXIN B GU 40-200000 IR SOLN
Status: DC | PRN
  Administered 2017-04-23: 2 mL

## 2017-04-23 MED ORDER — MAGNESIUM HYDROXIDE 400 MG/5ML PO SUSP: 30.0000 mL | Freq: Every day | ORAL | Status: DC | PRN

## 2017-04-23 MED ORDER — SODIUM CHLORIDE 0.9 % IV SOLN
INTRAVENOUS | Status: DC
  Administered 2017-04-23: 22:00:00 via INTRAVENOUS

## 2017-04-23 MED ORDER — ACETAMINOPHEN 500 MG PO TABS
1000.0000 mg | ORAL_TABLET | Freq: Four times a day (QID) | ORAL | Status: AC
  Administered 2017-04-23 – 2017-04-24 (×4): 1000 mg via ORAL
  Filled 2017-04-23 (×4): qty 2

## 2017-04-23 MED ORDER — MENTHOL 3 MG MT LOZG
1.0000 | LOZENGE | OROMUCOSAL | Status: DC | PRN
  Filled 2017-04-23: qty 9

## 2017-04-23 MED ORDER — EPINEPHRINE PF 1 MG/ML IJ SOLN
INTRAMUSCULAR | Status: AC
  Filled 2017-04-23: qty 1

## 2017-04-23 MED ORDER — ONDANSETRON HCL 4 MG/2ML IJ SOLN: 4.0000 mg | Freq: Once | INTRAMUSCULAR | Status: DC | PRN

## 2017-04-23 MED ORDER — DIPHENHYDRAMINE HCL 12.5 MG/5ML PO ELIX: 12.5000 mg | ORAL_SOLUTION | ORAL | Status: DC | PRN

## 2017-04-23 MED ORDER — ALUM & MAG HYDROXIDE-SIMETH 200-200-20 MG/5ML PO SUSP: 30.0000 mL | ORAL | Status: DC | PRN

## 2017-04-23 MED ORDER — EPINEPHRINE 30 MG/30ML IJ SOLN
INTRAMUSCULAR | Status: AC
  Filled 2017-04-23: qty 1

## 2017-04-23 MED ORDER — NEOMYCIN-POLYMYXIN B GU 40-200000 IR SOLN
Status: AC
  Filled 2017-04-23: qty 4

## 2017-04-23 MED ORDER — PROPOFOL 500 MG/50ML IV EMUL: INTRAVENOUS | Status: DC | PRN

## 2017-04-23 MED ORDER — ENOXAPARIN SODIUM 40 MG/0.4ML ~~LOC~~ SOLN
40.0000 mg | SUBCUTANEOUS | Status: DC
  Administered 2017-04-24: 40 mg via SUBCUTANEOUS
  Filled 2017-04-23: qty 0.4

## 2017-04-23 MED ORDER — PROPOFOL 10 MG/ML IV BOLUS
INTRAVENOUS | Status: DC | PRN
  Administered 2017-04-23 (×2): 20 mg via INTRAVENOUS
  Administered 2017-04-23: 30 mg via INTRAVENOUS

## 2017-04-23 MED ORDER — OXYCODONE HCL 5 MG PO TABS
5.0000 mg | ORAL_TABLET | ORAL | Status: DC | PRN
  Administered 2017-04-23 – 2017-04-24 (×2): 5 mg via ORAL
  Filled 2017-04-23 (×2): qty 1

## 2017-04-23 MED ORDER — EPHEDRINE SULFATE 50 MG/ML IJ SOLN
INTRAMUSCULAR | Status: DC | PRN
  Administered 2017-04-23: 10 mg via INTRAVENOUS
  Administered 2017-04-23: 15 mg via INTRAVENOUS

## 2017-04-23 MED ORDER — ACETAMINOPHEN 325 MG PO TABS: 650.0000 mg | ORAL_TABLET | ORAL | Status: DC | PRN

## 2017-04-23 MED ORDER — MORPHINE SULFATE (PF) 2 MG/ML IV SOLN: 2.0000 mg | INTRAVENOUS | Status: DC | PRN

## 2017-04-23 MED ORDER — BISACODYL 10 MG RE SUPP: 10.0000 mg | Freq: Every day | RECTAL | Status: DC | PRN

## 2017-04-23 MED ORDER — CEFAZOLIN SODIUM-DEXTROSE 2-4 GM/100ML-% IV SOLN
2.0000 g | Freq: Four times a day (QID) | INTRAVENOUS | Status: AC
  Administered 2017-04-24 (×2): 2 g via INTRAVENOUS
  Filled 2017-04-23 (×3): qty 100

## 2017-04-23 MED ORDER — PROPOFOL 10 MG/ML IV BOLUS
INTRAVENOUS | Status: AC
Start: 2017-04-23 — End: ?
  Filled 2017-04-23: qty 20

## 2017-04-23 MED ORDER — BUPIVACAINE HCL (PF) 0.5 % IJ SOLN
INTRAMUSCULAR | Status: AC
  Filled 2017-04-23: qty 30

## 2017-04-23 MED ORDER — MAGNESIUM CITRATE PO SOLN
1.0000 | Freq: Once | ORAL | Status: DC | PRN
  Filled 2017-04-23: qty 296

## 2017-04-23 MED ORDER — BUPIVACAINE HCL (PF) 0.5 % IJ SOLN
INTRAMUSCULAR | Status: DC | PRN
  Administered 2017-04-23: 2.5 mL

## 2017-04-23 SURGICAL SUPPLY — 53 items
BLADE SAW SAG 18.5X105 (BLADE) ×2 IMPLANT
BNDG COHESIVE 6X5 TAN STRL LF (GAUZE/BANDAGES/DRESSINGS) ×6 IMPLANT
CANISTER SUCT 1200ML W/VALVE (MISCELLANEOUS) ×2 IMPLANT
CAPT HIP HEMI 2 ×2 IMPLANT
CHLORAPREP W/TINT 26ML (MISCELLANEOUS) ×2 IMPLANT
DRAPE C-ARM XRAY 36X54 (DRAPES) ×2 IMPLANT
DRAPE INCISE IOBAN 66X60 STRL (DRAPES) IMPLANT
DRAPE POUCH INSTRU U-SHP 10X18 (DRAPES) ×2 IMPLANT
DRAPE SHEET LG 3/4 BI-LAMINATE (DRAPES) ×6 IMPLANT
DRAPE TABLE BACK 80X90 (DRAPES) ×2 IMPLANT
DRESSING SURGICEL FIBRLLR 1X2 (HEMOSTASIS) ×2 IMPLANT
DRSG OPSITE POSTOP 4X8 (GAUZE/BANDAGES/DRESSINGS) IMPLANT
DRSG SURGICEL FIBRILLAR 1X2 (HEMOSTASIS) ×4
ELECT BLADE 6.5 EXT (BLADE) ×2 IMPLANT
ELECT REM PT RETURN 9FT ADLT (ELECTROSURGICAL) ×2
ELECTRODE REM PT RTRN 9FT ADLT (ELECTROSURGICAL) ×1 IMPLANT
EVACUATOR 1/8 PVC DRAIN (DRAIN) IMPLANT
GLOVE BIOGEL PI IND STRL 9 (GLOVE) ×2 IMPLANT
GLOVE BIOGEL PI INDICATOR 9 (GLOVE) ×2
GLOVE SURG SYN 9.0  PF PI (GLOVE) ×3
GLOVE SURG SYN 9.0 PF PI (GLOVE) ×3 IMPLANT
GOWN SRG 2XL LVL 4 RGLN SLV (GOWNS) ×1 IMPLANT
GOWN STRL NON-REIN 2XL LVL4 (GOWNS) ×1
GOWN STRL REUS W/ TWL LRG LVL3 (GOWN DISPOSABLE) ×1 IMPLANT
GOWN STRL REUS W/TWL LRG LVL3 (GOWN DISPOSABLE) ×1
HOLDER FOLEY CATH W/STRAP (MISCELLANEOUS) IMPLANT
HOOD PEEL AWAY FLYTE STAYCOOL (MISCELLANEOUS) ×2 IMPLANT
KIT PREVENA INCISION MGT 13 (CANNISTER) ×2 IMPLANT
MAT BLUE FLOOR 46X72 FLO (MISCELLANEOUS) ×2 IMPLANT
NDL SAFETY ECLIPSE 18X1.5 (NEEDLE) ×1 IMPLANT
NEEDLE HYPO 18GX1.5 SHARP (NEEDLE) ×1
NEEDLE SPNL 18GX3.5 QUINCKE PK (NEEDLE) ×2 IMPLANT
NS IRRIG 1000ML POUR BTL (IV SOLUTION) ×2 IMPLANT
PACK HIP COMPR (MISCELLANEOUS) ×2 IMPLANT
SLEEVE PROTECTION STRL DISP (MISCELLANEOUS) ×2 IMPLANT
SOL PREP PVP 2OZ (MISCELLANEOUS) ×2
SOLUTION PREP PVP 2OZ (MISCELLANEOUS) ×1 IMPLANT
SPONGE DRAIN TRACH 4X4 STRL 2S (GAUZE/BANDAGES/DRESSINGS) IMPLANT
STAPLER SKIN PROX 35W (STAPLE) ×2 IMPLANT
STRAP SAFETY 5IN WIDE (MISCELLANEOUS) ×2 IMPLANT
SUT DVC 2 QUILL PDO  T11 36X36 (SUTURE) ×1
SUT DVC 2 QUILL PDO T11 36X36 (SUTURE) ×1 IMPLANT
SUT SILK 0 (SUTURE) ×1
SUT SILK 0 30XBRD TIE 6 (SUTURE) ×1 IMPLANT
SUT V-LOC 90 ABS DVC 3-0 CL (SUTURE) ×2 IMPLANT
SUT VIC AB 1 CT1 36 (SUTURE) ×2 IMPLANT
SYR 20CC LL (SYRINGE) ×2 IMPLANT
SYR 30ML LL (SYRINGE) ×2 IMPLANT
SYR BULB IRRIG 60ML STRL (SYRINGE) ×2 IMPLANT
TAPE MICROFOAM 4IN (TAPE) IMPLANT
TOWEL OR 17X26 4PK STRL BLUE (TOWEL DISPOSABLE) ×2 IMPLANT
TRAY FOLEY W/METER SILVER 16FR (SET/KITS/TRAYS/PACK) IMPLANT
WND VAC CANISTER 500ML (MISCELLANEOUS) ×2 IMPLANT

## 2017-04-23 NOTE — Consult Note (Signed)
Patient is a 77 year old community ambulator who tripped over her shoes last night suffering a fall injuring her left hip.  She denies prior hip trouble or prodromal symptoms.  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  On exam she has a shortened and externally rotated left leg with the foot in the traction boot she has palpable dorsalis pedis pulses of reflux in the toes and has intact sensation to the foot.  X-rays show a completely displaced femoral neck fracture with no underlying osteoarthritis  Impression is displaced femoral neck fracture  Recommendation is for left hip hemiarthroplasty, respect his possible complications discussed.  Plan on doing that later today

## 2017-04-23 NOTE — Anesthesia Procedure Notes (Addendum)
Spinal  Start time: 04/23/2017 8:00 PM End time: 04/23/2017 8:09 PM Staffing Anesthesiologist: Alvin Critchley, MD Resident/CRNA: Aline Brochure, CRNA Performed: anesthesiologist and resident/CRNA  Preanesthetic Checklist Completed: patient identified, site marked, surgical consent, pre-op evaluation, IV checked, risks and benefits discussed and monitors and equipment checked Spinal Block Patient position: left lateral decubitus Prep: ChloraPrep Patient monitoring: heart rate, continuous pulse ox and blood pressure Approach: midline Location: L3-4 Injection technique: single-shot Needle Needle type: Quincke  Needle gauge: 25 G Additional Notes Spinal performed as above under sterile prep and drape.  Spinal performed at L3-L4 interspace via paramedian position with the return of clear, colorless CSF in all 4 quadrants.  No blood or paresthesias.  Patient tolerated the procedure well.

## 2017-04-23 NOTE — Anesthesia Post-op Follow-up Note (Signed)
Anesthesia QCDR form completed.        

## 2017-04-23 NOTE — Op Note (Signed)
  PROCEDURE:  Procedure(s): ANTERIOR APPROACH HEMI HIP ARTHROPLASTY (Left)  SURGEON: Laurene Footman, MD  ASSISTANTS: None   ANESTHESIA:   spinal  EBL:  Blood:150]  BLOOD ADMINISTERED:none  DRAINS: none   LOCAL MEDICATIONS USED:  MARCAINE     SPECIMEN:  Source of Specimen:  Left femoral neck and head  DISPOSITION OF SPECIMEN:  PATHOLOGY  COUNTS:  YES  TOURNIQUET:  * No tourniquets in log *  IMPLANTS: AMIS 3 standard stem with S 22.2 mm head and 43 mm bipolar head  DICTATION: .Dragon Dictation   The patient was brought to the operating room and after spinal anesthesia was obtained PATIENT was placed on the operative table with the ipsilateral foot into the Medacta attachment, contralateral leg on a well-padded table. C-arm was brought in and preop template x-ray taken. After prepping and draping in usual sterile fashion appropriate patient identification and timeout procedures were completed. Anterior approach to the hip was obtained and centered over the greater trochanter and TFL muscle. The subcutaneous tissue was incised hemostasis being achieved by electrocautery. TFL fascia was incised and the muscle retracted laterally deep retractor placed. The lateral femoral circumflex vessels were identified and ligated. The anterior capsule was exposed and a capsulotomy performed. The neck was identified and a femoral neck cut carried out with a saw. The head was removed without difficulty along with the fractured neck at the subcapital level and showed no significant arthritis to the femoral head and acetabulum.  Acetabulum sized to 43` using trials. The leg was then externally rotated and ischiofemoral and pubfemoral releases carried out. The femur was sequentially broached to a size 3, and the final components chosen. The 2 standard stem was inserted along with a s 22.98mm head and 43 mm bipolar head . The hip was reduced and was stable the wound was thoroughly irrigated with  fibrillar placed along the posterior neck and medial neck. The deep fascia was closed using a heavy Quill after infiltration of 30 cc of quarter percent Sensorcaine with epinephrine. 3-0 v-locl to close the skin with skin staples incisional wound VAC applied  PLAN OF CARE: Continue as inpatient

## 2017-04-23 NOTE — Transfer of Care (Signed)
Immediate Anesthesia Transfer of Care Note  Patient: Adriana Sanders  Procedure(s) Performed: ANTERIOR APPROACH HEMI HIP ARTHROPLASTY (Left )  Patient Location: PACU  Anesthesia Type:Spinal  Level of Consciousness: awake, alert  and oriented  Airway & Oxygen Therapy: Patient connected to nasal cannula oxygen  Post-op Assessment: Post -op Vital signs reviewed and stable  Post vital signs: stable  Last Vitals:  Vitals:   04/23/17 1808 04/23/17 2110  BP: (!) 168/68 (!) 105/54  Pulse: 70 71  Resp: 20 17  Temp:  36.8 C  SpO2: 92% (!) 3%    Last Pain:  Vitals:   04/23/17 2110  TempSrc: Temporal  PainSc:       Patients Stated Pain Goal: 3 (16/38/45 3646)  Complications: No apparent anesthesia complications

## 2017-04-23 NOTE — Care Management Note (Signed)
Case Management Note  Patient Details  Name: Malyiah Fellows MRN: [id number] Date of Birth: May 28, 1940  Subjective/Objective:                  RNCM met with patient and several family members to discuss transition of care. Patient lives with her two children however she is independent with daily activities. She has no DME available for use at home. She actually was visiting another daughter when she fell. Patient lives in [place]. She uses [facility] on [address] [phone] for medications. She anticipates surgery today with Dr. Rudene Christians. Patient hopes she can go to SNF at discharge.    Action/Plan: Home health list provided.    Expected Discharge Date:  04/26/17               Expected Discharge Plan:     In-House Referral:     Discharge planning Services  CM Consult  Post Acute Care Choice:  Durable Medical Equipment, Home Health Choice offered to:  Patient, Adult Children  DME Arranged:    DME Agency:     HH Arranged:    [place] Agency:     Status of Service:  In process, will continue to follow  If discussed at Long Length of Stay Meetings, dates discussed:    Additional Comments:  Marshell Garfinkel, RN 04/23/2017, 1:21 PM

## 2017-04-23 NOTE — Progress Notes (Signed)
Per MD Hulan Fray for patient to have breakfast today and place NPO after.

## 2017-04-23 NOTE — Anesthesia Preprocedure Evaluation (Addendum)
Anesthesia Evaluation  Patient identified by MRN, date of birth, ID band Patient awake    Reviewed: Allergy & Precautions, NPO status , Patient's Chart, lab work & pertinent test results  Airway Mallampati: III  TM Distance: <3 FB     Dental  (+) Teeth Intact   Pulmonary Current Smoker,    Pulmonary exam normal        Cardiovascular hypertension, Pt. on medications Normal cardiovascular exam     Neuro/Psych CVA negative psych ROS   GI/Hepatic negative GI ROS, Neg liver ROS,   Endo/Other  diabetes  Renal/GU negative Renal ROS     Musculoskeletal   Abdominal Normal abdominal exam  (+)   Peds negative pediatric ROS (+)  Hematology negative hematology ROS (+)   Anesthesia Other Findings   Reproductive/Obstetrics                            Anesthesia Physical Anesthesia Plan  ASA: III  Anesthesia Plan: Spinal   Post-op Pain Management:    Induction: Intravenous  PONV Risk Score and Plan:   Airway Management Planned: Nasal Cannula  Additional Equipment:   Intra-op Plan:   Post-operative Plan:   Informed Consent: I have reviewed the patients History and Physical, chart, labs and discussed the procedure including the risks, benefits and alternatives for the proposed anesthesia with the patient or authorized representative who has indicated his/her understanding and acceptance.   Dental advisory given  Plan Discussed with: CRNA and Surgeon  Anesthesia Plan Comments: (Reviewed 2018 ASRA guidelines in regard to dipyridimole/asa and the wait time was 24 hrs. For rgional block.  Patient was counseled of the risks and wishes to proceed with regional block, especially with increased risk of pulmonary complications with GOT.)       Anesthesia Quick Evaluation

## 2017-04-23 NOTE — Progress Notes (Signed)
[place] [facility] [place] NAME: Adriana Sanders    MR#:  [id number]  DATE OF BIRTH:  Jan 01, 1941  SUBJECTIVE:  CHIEF COMPLAINT:   Chief Complaint  Patient presents with  . Fall   Better left hip pain. REVIEW OF SYSTEMS:  Review of Systems  Constitutional: Negative for chills, fever and malaise/fatigue.  HENT: Negative for nosebleeds and sore throat.   Eyes: Negative for blurred vision and double vision.  Respiratory: Negative for cough, hemoptysis, sputum production, shortness of breath, wheezing and stridor.   Cardiovascular: Negative for chest pain, palpitations, orthopnea and leg swelling.  Gastrointestinal: Negative for abdominal pain, blood in stool, constipation, diarrhea, melena, nausea and vomiting.  Genitourinary: Negative for dysuria, flank pain, frequency, hematuria and urgency.  Musculoskeletal: Positive for joint pain. Negative for back pain and falls.  Skin: Negative for rash.  Neurological: Negative for dizziness, sensory change, focal weakness, seizures, loss of consciousness, weakness and headaches.  Endo/Heme/Allergies: Negative for polydipsia. Does not bruise/bleed easily.  Psychiatric/Behavioral: Negative for depression. The patient is not nervous/anxious.     DRUG ALLERGIES:   Allergies  Allergen Reactions  . Vicodin [Hydrocodone-Acetaminophen]    VITALS:  Blood pressure (!) 153/54, pulse 66, temperature 98.8 F (37.1 C), temperature source Oral, resp. rate 20, height 5\' 2"  (1.575 m), weight 150 lb (68 kg), SpO2 92 %. PHYSICAL EXAMINATION:  Physical Exam  Constitutional: She is oriented to person, place, and time and well-developed, well-nourished, and in no distress.  HENT:  Head: Normocephalic.  Mouth/Throat: Oropharynx is clear and moist.  Eyes: Conjunctivae and EOM are normal. Pupils are equal, round, and reactive to light. No scleral icterus.  Neck: Normal range of motion. Neck supple. No JVD present. No  tracheal deviation present.  Cardiovascular: Normal rate, regular rhythm and normal heart sounds. Exam reveals no gallop.  No murmur heard. Pulmonary/Chest: Effort normal and breath sounds normal. No respiratory distress. She has no wheezes. She has no rales.  Abdominal: Soft. Bowel sounds are normal. She exhibits no distension. There is no tenderness. There is no rebound.  Musculoskeletal: She exhibits no edema or tenderness.  Neurological: She is alert and oriented to person, place, and time. No cranial nerve deficit.  Skin: No rash noted. No erythema.  Psychiatric: Affect normal.   LABORATORY PANEL:  Female CBC Recent Labs  Lab 04/23/17 0317  WBC 6.5  HGB 13.3  HCT 39.2  PLT 166   ------------------------------------------------------------------------------------------------------------------ Chemistries  Recent Labs  Lab 04/23/17 0317  NA 138  K 3.5  CL 104  CO2 26  GLUCOSE 131*  BUN 18  CREATININE 0.96  CALCIUM 9.2   RADIOLOGY:  Ct Head Wo Contrast  Result Date: 04/22/2017 CLINICAL DATA:  Fall at home. EXAM: CT HEAD WITHOUT CONTRAST TECHNIQUE: Contiguous axial images were obtained from the base of the skull through the vertex without intravenous contrast. COMPARISON:  None. FINDINGS: Brain: Mild chronic ischemic white matter disease is noted. No mass effect or midline shift is noted. Ventricular size is within normal limits. There is no evidence of mass lesion, hemorrhage or acute infarction. Vascular: No hyperdense vessel or unexpected calcification. Skull: Normal. Negative for fracture or focal lesion. Sinuses/Orbits: Bilateral ethmoid and maxillary sinusitis is noted. Other: None. IMPRESSION: Mild chronic ischemic white matter disease. No acute intracranial abnormality seen. Electronically Signed   By: Marijo Conception, M.D.   On: 04/22/2017 19:05   Dg Chest Portable 1 View  Result Date: 04/22/2017 CLINICAL DATA:  Hip fracture EXAM: PORTABLE CHEST 1 VIEW COMPARISON:   None. FINDINGS: Asymmetric opacity in the left thorax felt related to overlying soft tissue. No acute pulmonary infiltrate or effusion. Cardiomediastinal silhouette within normal limits. Mild aortic atherosclerosis. No pneumothorax. IMPRESSION: No active disease. Electronically Signed   By: Donavan Foil M.D.   On: 04/22/2017 19:42   Dg Knee Complete 4 Views Left  Result Date: 04/22/2017 CLINICAL DATA:  Fall with hip pain EXAM: LEFT KNEE - COMPLETE 4+ VIEW COMPARISON:  None. FINDINGS: No acute displaced fracture or malalignment is seen. Mild patellofemoral degenerative changes. Moderate degenerative changes of the medial compartment and mild degenerative changes of the lateral compartment. Joint space calcifications. No significant effusion. Popliteal vascular calcification IMPRESSION: 1. No acute osseous abnormality 2. 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   On: 04/22/2017 19:42   Dg Hip Unilat W Or Wo Pelvis 2-3 Views Left  Result Date: 04/22/2017 CLINICAL DATA:  Hip pain after fall EXAM: DG HIP (WITH OR WITHOUT PELVIS) 2-3V LEFT COMPARISON:  None. FINDINGS: Mild SI joint degenerative changes. Pubic symphysis and rami are intact. The right femoral head projects in joint. Acute minimally displaced left femoral neck fracture. Femoral head alignment is within normal limits. IMPRESSION: 1. Acute left femoral neck fracture.  No femoral head dislocation. Electronically Signed   By: Donavan Foil M.D.   On: 04/22/2017 19:41   ASSESSMENT AND PLAN:   Left hip fracture. The patient has moderate risk for hip surgery.   Hip surgery today per Dr. Rudene Christians. Hold Lovenox, continue Lopressor. PT and DVT prophylaxis after surgery.  Hypertension.  Continue hypertension medication.  Diabetes.  on sliding scale.  Hold metformin. History of CVA.  Hold Aggrenox for now, continue statin. Tobacco abuse.  Smoking cessation was counseled for 3-4 minutes,  nicotine patch.  All the records are reviewed and case discussed with Care Management/Social Worker. Management plans discussed with the patient, family and they are in agreement.  CODE STATUS: Full Code  TOTAL TIME TAKING CARE OF THIS PATIENT: 27 minutes.   More than 50% of the time was spent in counseling/coordination of care: YES  POSSIBLE D/C IN 3 DAYS, DEPENDING ON CLINICAL CONDITION.   Demetrios Loll M.D on 04/23/2017 at 1:38 PM  Between 7am to 6pm - Pager - [phone]  After 6pm go to [email or web address] - [facility]

## 2017-04-24 ENCOUNTER — Encounter: Payer: Self-pay | Admitting: Orthopedic Surgery

## 2017-04-24 LAB — BASIC METABOLIC PANEL
Anion gap: 8 (ref 5–15)
BUN: 14 mg/dL (ref 6–20)
CO2: 22 mmol/L (ref 22–32)
Calcium: 8.5 mg/dL — ABNORMAL LOW (ref 8.9–10.3)
Chloride: 109 mmol/L (ref 101–111)
Creatinine, Ser: 0.74 mg/dL (ref 0.44–1.00)
GFR calc Af Amer: >60 mL/min (ref >60)
GFR calc non Af Amer: >60 mL/min (ref >60)
Glucose, Bld: 142 mg/dL — ABNORMAL HIGH (ref 65–99)
Potassium: 3 mmol/L — ABNORMAL LOW (ref 3.5–5.1)
Sodium: 139 mmol/L (ref 135–145)

## 2017-04-24 LAB — GLUCOSE, CAPILLARY
Glucose-Capillary: 117 mg/dL — ABNORMAL HIGH (ref 65–99)
Glucose-Capillary: 123 mg/dL — ABNORMAL HIGH (ref 65–99)
Glucose-Capillary: 149 mg/dL — ABNORMAL HIGH (ref 65–99)
Glucose-Capillary: 136 mg/dL — ABNORMAL HIGH (ref 65–99)

## 2017-04-24 LAB — CBC
HCT: 34.8 % — ABNORMAL LOW (ref 35.0–47.0)
Hemoglobin: 11.8 g/dL — ABNORMAL LOW (ref 12.0–16.0)
MCH: 29.4 pg (ref 26.0–34.0)
MCHC: 34 g/dL (ref 32.0–36.0)
MCV: 86.6 fL (ref 80.0–100.0)
Platelets: 144 10*3/uL — ABNORMAL LOW (ref 150–440)
RBC: 4.01 MIL/uL (ref 3.80–5.20)
RDW: 13.5 % (ref 11.5–14.5)
WBC: 6.3 10*3/uL (ref 3.6–11.0)

## 2017-04-24 LAB — MAGNESIUM: Magnesium: 1.6 mg/dL — ABNORMAL LOW (ref 1.7–2.4)

## 2017-04-24 MED ORDER — OXYCODONE HCL 5 MG PO TABS: 5.0000 mg | ORAL_TABLET | ORAL | 0 refills | Status: DC | PRN

## 2017-04-24 MED ORDER — POTASSIUM CHLORIDE CRYS ER 20 MEQ PO TBCR
40.0000 meq | EXTENDED_RELEASE_TABLET | Freq: Once | ORAL | Status: AC
  Administered 2017-04-24: 40 meq via ORAL
  Filled 2017-04-24: qty 2

## 2017-04-24 MED ORDER — PNEUMOCOCCAL VAC POLYVALENT 25 MCG/0.5ML IJ INJ
0.5000 mL | INJECTION | INTRAMUSCULAR | Status: DC
Start: 2017-04-25 — End: 2017-04-25

## 2017-04-24 MED ORDER — ASPIRIN-DIPYRIDAMOLE ER 25-200 MG PO CP12
1.0000 | ORAL_CAPSULE | Freq: Two times a day (BID) | ORAL | Status: DC
  Administered 2017-04-24 – 2017-04-25 (×2): 1 via ORAL
  Filled 2017-04-24 (×3): qty 1

## 2017-04-24 MED ORDER — MAGNESIUM SULFATE 2 GM/50ML IV SOLN
2.0000 g | Freq: Once | INTRAVENOUS | Status: AC
  Administered 2017-04-24: 2 g via INTRAVENOUS
  Filled 2017-04-24: qty 50

## 2017-04-24 MED ORDER — BISACODYL 5 MG PO TBEC: 5.0000 mg | DELAYED_RELEASE_TABLET | Freq: Every day | ORAL | 0 refills | Status: AC | PRN

## 2017-04-24 MED ORDER — ENOXAPARIN SODIUM 40 MG/0.4ML ~~LOC~~ SOLN: 40.0000 mg | SUBCUTANEOUS | 0 refills | Status: DC

## 2017-04-24 NOTE — Progress Notes (Signed)
Chaplain responded to a nurse's request to meet patient to discuss spiritual care. Patient would like a priest to visit so that she can receive Communion.  Chaplain will facilitate contact with the priest and refer to the completion of the request to [place].

## 2017-04-24 NOTE — Progress Notes (Signed)
Paged Dr. Marcille Blanco for potassium of 3.0. Ordered to give one time dose of KCl 75meQ PO. Will administer as ordered.

## 2017-04-24 NOTE — Progress Notes (Signed)
Physical Therapy Treatment Patient Details Name: Adriana Sanders MRN: [id number] DOB: June 27, 1940 Today's Date: 04/24/2017    History of Present Illness admitted for acute hospitalization status post fall in home environment; admitted with L femoral neck fracture s/p L anterior hip hemiarthroplasty, WBAT (04/23/17).    PT Comments    Patient improving in gait distance, performing with no greater than cga/min assist with RW, though limited to only 15' due to pain in hip.  Repeated cuing for hand placement and safety needs, but good efforts to integrate cuing as appropriate.  Progression may be slower than typical due to pain issues and mild difficulty with recall/integration of new information. Extended discussion with family regarding discharge plan.  Upon clarification of home environment with daughter (2-level home with bed/bathroom upstairs vs 1-level home previously reported by patient), patient unlikely to reach point where she can safely negotiate home environment (stairs) prior to discharge.  May consider transition to STR if progress remains slower than anticipated and limited by pain.  Discharge recs updated to reflect.   Follow Up Recommendations  SNF     Equipment Recommendations       Recommendations for Other Services       Precautions / Restrictions Precautions Precautions: Anterior Hip;Fall Restrictions Weight Bearing Restrictions: Yes Other Position/Activity Restrictions: WBAT    Mobility  Bed Mobility Overal bed mobility: Needs Assistance Bed Mobility: Supine to Sit     Supine to sit: Min assist        Transfers Overall transfer level: Needs assistance Equipment used: Rolling walker (2 wheeled) Transfers: Sit to/from Stand Sit to Stand: Min guard;Min assist         General transfer comment: cuing for hand placement; limited recall of technique  Ambulation/Gait Ambulation/Gait assistance: Min guard;Min assist Ambulation Distance (Feet): 12  Feet Assistive device: Rolling walker (2 wheeled)       General Gait Details: 3-point, step to gait pattern with forward flexed posture/downward gaze (patient watching LEs/feet); instructed in postural extension, increased cadence   Stairs            Wheelchair Mobility    Modified Rankin (Stroke Patients Only)       Balance Overall balance assessment: Needs assistance Sitting-balance support: No upper extremity supported;Feet supported Sitting balance-Leahy Scale: Good     Standing balance support: Bilateral upper extremity supported Standing balance-Leahy Scale: Fair                              Cognition Arousal/Alertness: Awake/alert Behavior During Therapy: WFL for tasks assessed/performed Overall Cognitive Status: Within Functional Limits for tasks assessed                                        Exercises Other Exercises Other Exercises: Toilet transfer, SPT with RW, min assist with RW; sit/stand from [facility] with RW, min assist.  Continued cuing to maintain single UE on RW with all standing activities. Other Exercises: Extended discussion with family regarding initial discharge recommendations.  Reviewed patient performance during initial evaluation (level of assist, toleracne for and response to activity), expected trajectory and rehab course.  Family voiced understanding, but do not feel patient performing adequate activity to ensure appropriate discharge determination.  Family also insistent that patient is not communicating appropriate information, is incompetent to make decisions and is incapable of performance indicated by therapist this  AM.  Continued to review criteria for skilled rehab placement, as well as criteria for [facility] services and additional resources available to support patient/family.  Family present to observe patient's performance during session.  Despite patient performing at cga/min assist with RW for all functional  activities, family remains insistent that patient is requiring more assist that she would have available upon discharge. Other Exercises: Did clarify home living environment with family-per daughter, patient with return to 2-level home with full flight of stairs to access bed/bathroom (no options to stay on main level of home if needed).  Daughter working during the day, and 4 friends/family members present during session insistent that no one is available to stay with/assist during the day. Other Exercises: Family did request handouts with therex to be performed outside of therapy; provided handout with written/pictorial descriptions.  Encouraged performance as tolerated, encouraged OOB to chair for meals and progression towards gait to/from bathroom as appropriate (with staff assist).  Also reviewed importance of neutral hip extension (lying flat) 1-2x/day for flexibility and postural control.  Patient/family voiced understanding and agreement.    General Comments        Pertinent Vitals/Pain Pain Assessment: Faces Faces Pain Scale: Hurts even more Pain Location: L hip Pain Descriptors / Indicators: Aching;Grimacing;Guarding Pain Intervention(s): Limited activity within patient's tolerance;Monitored during session;Repositioned;Patient requesting pain meds-RN notified    Home Living                      Prior Function            PT Goals (current goals can now be found in the care plan section) Acute Rehab PT Goals Patient Stated Goal: to return to daughter's home PT Goal Formulation: With patient Time For Goal Achievement: 05/08/17 Potential to Achieve Goals: Good Progress towards PT goals: Progressing toward goals    Frequency    BID      PT Plan Current plan remains appropriate    Co-evaluation              AM-PAC PT "6 Clicks" Daily Activity  Outcome Measure  Difficulty turning over in bed (including adjusting bedclothes, sheets and blankets)?:  Unable Difficulty moving from lying on back to sitting on the side of the bed? : Unable Difficulty sitting down on and standing up from a chair with arms (e.g., wheelchair, bedside commode, etc,.)?: Unable Help needed moving to and from a bed to chair (including a wheelchair)?: A Little Help needed walking in hospital room?: A Little Help needed climbing 3-5 steps with a railing? : A Lot 6 Click Score: 11    End of Session Equipment Utilized During Treatment: Gait belt Activity Tolerance: Patient tolerated treatment well Patient left: in bed;with call bell/phone within reach;with bed alarm set;with family/visitor present Nurse Communication: Mobility status PT Visit Diagnosis: Muscle weakness (generalized) (M62.81);Pain;Difficulty in walking, not elsewhere classified (R26.2) Pain - Right/Left: Left Pain - part of body: Hip     Time: 1610-9604 PT Time Calculation (min) (ACUTE ONLY): 46 min  Charges:  $Gait Training: 8-22 mins $Therapeutic Activity: 23-37 mins                    G Codes:      Laqueisha Catalina H. Owens Shark, PT, DPT, NCS 04/24/17, 5:29 PM [phone]

## 2017-04-24 NOTE — Progress Notes (Signed)
Chaplain contacted [employer] and requested that either Fr. Paul or Fr. Briant visit the patient and provide the sacrament of Communion. The ad. assistant at [facility] believes that someone will visit the patient today. Chaplain informed patient.

## 2017-04-24 NOTE — Progress Notes (Signed)
[facility] NAME: Adriana Sanders    MR#:  [id number]  DATE OF BIRTH:  10/01/1940  SUBJECTIVE:  CHIEF COMPLAINT:   Chief Complaint  Patient presents with  . Fall   Better left hip pain. No SOB. REVIEW OF SYSTEMS:  Review of Systems  Constitutional: Negative for chills, fever and malaise/fatigue.  HENT: Negative for nosebleeds and sore throat.   Eyes: Negative for blurred vision and double vision.  Respiratory: Negative for cough, hemoptysis, sputum production, shortness of breath, wheezing and stridor.   Cardiovascular: Negative for chest pain, palpitations, orthopnea and leg swelling.  Gastrointestinal: Negative for abdominal pain, blood in stool, constipation, diarrhea, melena, nausea and vomiting.  Genitourinary: Negative for dysuria, flank pain, frequency, hematuria and urgency.  Musculoskeletal: Positive for joint pain. Negative for back pain and falls.  Skin: Negative for rash.  Neurological: Negative for dizziness, sensory change, focal weakness, seizures, loss of consciousness, weakness and headaches.  Endo/Heme/Allergies: Negative for polydipsia. Does not bruise/bleed easily.  Psychiatric/Behavioral: Negative for depression. The patient is not nervous/anxious.     DRUG ALLERGIES:   Allergies  Allergen Reactions  . Vicodin [Hydrocodone-Acetaminophen]    VITALS:  Blood pressure (!) 125/55, pulse 81, temperature 98.3 F (36.8 C), temperature source Oral, resp. rate 20, height 5\' 2"  (1.575 m), weight 150 lb (68 kg), SpO2 92 %. PHYSICAL EXAMINATION:  Physical Exam  Constitutional: She is oriented to person, place, and time and well-developed, well-nourished, and in no distress.  HENT:  Head: Normocephalic.  Mouth/Throat: Oropharynx is clear and moist.  Eyes: Conjunctivae and EOM are normal. Pupils are equal, round, and reactive to light. No scleral icterus.  Neck: Normal range of motion. Neck supple. No JVD present.  No tracheal deviation present.  Cardiovascular: Normal rate, regular rhythm and normal heart sounds. Exam reveals no gallop.  No murmur heard. Pulmonary/Chest: Effort normal and breath sounds normal. No respiratory distress. She has no wheezes. She has no rales.  Abdominal: Soft. Bowel sounds are normal. She exhibits no distension. There is no tenderness. There is no rebound.  Musculoskeletal: She exhibits no edema or tenderness.  Neurological: She is alert and oriented to person, place, and time. No cranial nerve deficit.  Skin: No rash noted. No erythema.  Psychiatric: Affect normal.   LABORATORY PANEL:  Female CBC Recent Labs  Lab 04/24/17 0333  WBC 6.3  HGB 11.8*  HCT 34.8*  PLT 144*   ------------------------------------------------------------------------------------------------------------------ Chemistries  Recent Labs  Lab 04/24/17 0333  NA 139  K 3.0*  CL 109  CO2 22  GLUCOSE 142*  BUN 14  CREATININE 0.74  CALCIUM 8.5*  MG 1.6*   RADIOLOGY:  Dg Hip Operative Unilat W Or W/o Pelvis Left  Result Date: 04/23/2017 CLINICAL DATA:  Left hip replacement EXAM: OPERATIVE left HIP (WITH PELVIS IF PERFORMED) 2 VIEWS TECHNIQUE: Fluoroscopic spot image(s) were submitted for interpretation post-operatively. COMPARISON:  Radiograph 04/22/2016 FINDINGS: Two low resolution intraoperative spot views of the left hip. Total fluoroscopy time was 12 seconds. There is a left hip replacement with normal alignment IMPRESSION: Intraoperative fluoroscopic assistance provided during left hip replacement Electronically Signed   By: Donavan Foil M.D.   On: 04/23/2017 21:14   Dg Hip Unilat W Or W/o Pelvis 2-3 Views Left  Result Date: 04/23/2017 CLINICAL DATA:  Postop EXAM: DG HIP (WITH OR WITHOUT PELVIS) 2-3V LEFT COMPARISON:  04/22/2017 FINDINGS: Interim left hip replacement with normal alignment. Cutaneous staples and small amount  of soft tissue gas laterally. Pubic symphysis and rami are  intact IMPRESSION: Interval left hip replacement with expected postsurgical changes Electronically Signed   By: Donavan Foil M.D.   On: 04/23/2017 22:07   ASSESSMENT AND PLAN:   Left hip fracture. The patient has moderate risk for hip surgery.   S/p Hip surgery, resume aggrenox, continue Lopressor. PT and DVT prophylaxis.  Hypokalemia.  Give potassium supplement and follow-up level. Hypomagnesemia.  Given magnesium IV and follow-up level. Anemia due to postop acute blood loss.  Follow-up hemoglobin. Hypertension.  Continue hypertension medication. Diabetes.  on sliding scale.  Hold metformin. History of CVA.  resume Aggrenox, continue statin. Tobacco abuse.  Smoking cessation was counseled for 3-4 minutes, nicotine patch. PT evaluation suggest home with home health.  All the records are reviewed and case discussed with Care Management/Social Worker. Management plans discussed with the patient, family and they are in agreement.  CODE STATUS: Full Code  TOTAL TIME TAKING CARE OF THIS PATIENT: 27 minutes.   More than 50% of the time was spent in counseling/coordination of care: YES  POSSIBLE D/C IN 2 DAYS, DEPENDING ON CLINICAL CONDITION.   Demetrios Loll M.D on 04/24/2017 at 1:44 PM  Between 7am to 6pm - Pager - [phone]  After 6pm go to [email or web address] - [facility]

## 2017-04-24 NOTE — Evaluation (Signed)
Physical Therapy Evaluation Patient Details Name: Adriana Sanders MRN: [id number] DOB: 1940-10-30 Today's Date: 04/24/2017   History of Present Illness  admitted for acute hospitalization status post fall in home environment; admitted with L femoral neck fracture s/p L anterior hip hemiarthroplasty, WBAT (04/23/17).  Clinical Impression  Upon evaluation, patient alert and oriented; follows all commands and demonstrates good insight/safety awareness with all functional tasks. R hip strength (3-/5) and ROM appropriately guarded in acute post-op phase, but appropriate for basic transfers and mobility throughout session.  Patient initially fearful of all mobility tasks, but improved with repetition and encouragement.  Currently requiring min assist for bed mobility; min assist for sit/stand, basic transfers and gait (5' x2) with RW.  Decreased stance time R LE, very guarded and cautious; anticipate consistent improvement in performance as pain controlled and comfort/confidence in movement improves. Would benefit from skilled PT to address above deficits and promote optimal return to PLOF; Recommend transition to [facility] upon discharge from acute hospitalization.     Follow Up Recommendations Home health PT    Equipment Recommendations  Rolling walker with 5" wheels;3in1 (PT)    Recommendations for Other Services       Precautions / Restrictions Precautions Precautions: Anterior Hip;Fall Restrictions Weight Bearing Restrictions: Yes Other Position/Activity Restrictions: WBAT      Mobility  Bed Mobility Overal bed mobility: Needs Assistance Bed Mobility: Supine to Sit     Supine to sit: Min assist     General bed mobility comments: assist for R LE management  Transfers Overall transfer level: Needs assistance Equipment used: Rolling walker (2 wheeled) Transfers: Sit to/from Stand Sit to Stand: Min assist         General transfer comment: increased time to complete, cuing for hand  placement  Ambulation/Gait Ambulation/Gait assistance: Min assist Ambulation Distance (Feet): 5 Feet(x2) Assistive device: Rolling walker (2 wheeled)       General Gait Details: 3-point, step to gait pattern; fair WBing/stance time L LE; slow and guarded  Stairs            Wheelchair Mobility    Modified Rankin (Stroke Patients Only)       Balance Overall balance assessment: Needs assistance Sitting-balance support: No upper extremity supported;Feet supported Sitting balance-Leahy Scale: Good     Standing balance support: Bilateral upper extremity supported Standing balance-Leahy Scale: Fair                               Pertinent Vitals/Pain Pain Assessment: 0-10 Pain Score: 7  Pain Location: L hip Pain Descriptors / Indicators: Aching;Grimacing;Guarding Pain Intervention(s): Limited activity within patient's tolerance;Monitored during session;Repositioned    Home Living Family/patient expects to be discharged to:: Private residence Living Arrangements: Children Available Help at Discharge: Family Type of Home: House Home Access: Stairs to enter Entrance Stairs-Rails: None Technical brewer of Steps: 3 Home Layout: One level Home Equipment: None Additional Comments: Patient lives in [place], in town visiting daughter when she fell/injured herself; plans to discharge home with daughter (works outside of the home)    Prior Function Level of Independence: Independent         Comments: Indep without assist device for ADLs, household and community mobility; very active; denies additional fall history.     Hand Dominance        Extremity/Trunk Assessment   Upper Extremity Assessment Upper Extremity Assessment: Overall WFL for tasks assessed    Lower Extremity Assessment Lower Extremity  Assessment: (L hip grossly 3-/5, knee and ankle 4+ to 5/5.  R LE grossly WFL)       Communication   Communication: No difficulties  Cognition  Arousal/Alertness: Awake/alert Behavior During Therapy: WFL for tasks assessed/performed Overall Cognitive Status: Within Functional Limits for tasks assessed                                        General Comments      Exercises Other Exercises Other Exercises: Toilet transfer, SPT with RW, min assist with RW; sit/stand from [facility] with RW, min assist. Other Exercises: Lower body dressing, min assist to thread pants over LEs; instructed to don L LE first for optimal safety/indep.  Standing balance fo rhygiene, clothing management, cga/min assist with RW-cuing for hand placement, encouraged to maintain single UE placement on RW at all times.  Patient voiced understanding.   Assessment/Plan    PT Assessment Patient needs continued PT services  PT Problem List Decreased strength;Decreased range of motion;Decreased activity tolerance;Decreased balance;Decreased mobility;Decreased knowledge of use of DME;Decreased safety awareness;Decreased knowledge of precautions;Decreased skin integrity;Pain       PT Treatment Interventions DME instruction;Gait training;Stair training;Functional mobility training;Therapeutic exercise;Balance training;Therapeutic activities;Patient/family education    PT Goals (Current goals can be found in the Care Plan section)  Acute Rehab PT Goals Patient Stated Goal: to return to daughter's home PT Goal Formulation: With patient Time For Goal Achievement: 05/08/17 Potential to Achieve Goals: Good    Frequency BID   Barriers to discharge Decreased caregiver support      Co-evaluation               AM-PAC PT "6 Clicks" Daily Activity  Outcome Measure Difficulty turning over in bed (including adjusting bedclothes, sheets and blankets)?: Unable Difficulty moving from lying on back to sitting on the side of the bed? : Unable Difficulty sitting down on and standing up from a chair with arms (e.g., wheelchair, bedside commode, etc,.)?:  Unable Help needed moving to and from a bed to chair (including a wheelchair)?: A Little Help needed walking in hospital room?: A Little Help needed climbing 3-5 steps with a railing? : A Lot 6 Click Score: 11    End of Session Equipment Utilized During Treatment: Gait belt Activity Tolerance: Patient tolerated treatment well Patient left: in chair;with call bell/phone within reach;with chair alarm set Nurse Communication: Mobility status PT Visit Diagnosis: Muscle weakness (generalized) (M62.81);Pain;Difficulty in walking, not elsewhere classified (R26.2) Pain - Right/Left: Left Pain - part of body: Hip    Time: 3734-2876 PT Time Calculation (min) (ACUTE ONLY): 31 min   Charges:   PT Evaluation $PT Eval Moderate Complexity: 1 Mod PT Treatments $Therapeutic Activity: 23-37 mins   PT G Codes:        Kaila Devries H. Owens Shark, PT, DPT, NCS 04/24/17, 11:58 AM [phone]

## 2017-04-24 NOTE — Progress Notes (Signed)
   Subjective: 1 Day Post-Op Procedure(s) (LRB): ANTERIOR APPROACH HEMI HIP ARTHROPLASTY (Left) Patient reports pain as mild.   Patient is well, and has had no acute complaints or problems Denies any CP, SOB, ABD pain. We will start therapy today.    Objective: Vital signs in last 24 hours: Temp:  [97.4 F (36.3 C)-98.9 F (37.2 C)] 98.3 F (36.8 C) (02/22 0805) Pulse Rate:  [58-132] 81 (02/22 0805) Resp:  [12-23] 20 (02/22 0805) BP: (92-168)/(47-68) 125/55 (02/22 0805) SpO2:  [3 %-100 %] 92 % (02/22 0805)  Intake/Output from previous day: 02/21 0701 - 02/22 0700 In: 3764.2 [P.O.:840; I.V.:2824.2; IV Piggyback:100] Out: 925 [Urine:775; Blood:150] Intake/Output this shift: No intake/output data recorded.  Recent Labs    04/22/17 1842 04/23/17 0317 04/24/17 0333  HGB 14.6 13.3 11.8*   Recent Labs    04/23/17 0317 04/24/17 0333  WBC 6.5 6.3  RBC 4.51 4.01  HCT 39.2 34.8*  PLT 166 144*   Recent Labs    04/23/17 0317 04/24/17 0333  NA 138 139  K 3.5 3.0*  CL 104 109  CO2 26 22  BUN 18 14  CREATININE 0.96 0.74  GLUCOSE 131* 142*  CALCIUM 9.2 8.5*   Recent Labs    04/22/17 1842  INR 0.95    EXAM General - Patient is Alert, Appropriate and Oriented Extremity - Neurovascular intact Sensation intact distally Intact pulses distally Dorsiflexion/Plantar flexion intact No cellulitis present Compartment soft Dressing - dressing C/D/I and no drainage.  Praveena negative pressure dressing is intact with no drainage Motor Function - intact, moving foot and toes well on exam.   Past Medical History:  Diagnosis Date  . Diabetes mellitus without complication ([facility])   . Hyperlipemia   . Hypertension   . Stroke [facility])     Assessment/Plan:   1 Day Post-Op Procedure(s) (LRB): ANTERIOR APPROACH HEMI HIP ARTHROPLASTY (Left) Active Problems:   Closed left hip fracture ([facility])   Acute post op blood loss anemia    Hypokalemia  Estimated body mass index is  27.44 kg/m as calculated from the following:   Height as of this encounter: 5\' 2"  (1.575 m).   Weight as of this encounter: 68 kg (150 lb). Advance diet Up with therapy  Needs bowel movement Acute post op blood loss anemia - Hgb 11.8.  We will recheck labs tomorrow Hypokalemia- k 3.0.  Medicine managing with oral potassium Care management to assist with discharge  Day of discharge, please attach Praveena negative pressure dressing.  Negative pressure dressing will need to be removed and switched to honey, dressing 7 days after day of discharge  Continue with Lovenox 40 mg subcutaneous a day of discharge   DVT Prophylaxis - Lovenox, Foot Pumps and SCD Weight-Bearing as tolerated to left leg   T. Rachelle Hora, PA-C [place] 04/24/2017, 8:12 AM

## 2017-04-24 NOTE — Care Management (Signed)
RNCM spoke again with patient and daughter. They insist on SNF; said "I was only up to the chair".  She will be alone in the home per daughter. She will need a walker if she goes home. Jerm[facility] with [facility] updated. Lovenox has been called in to [facility] however this will need to be cancelled if she goes to SNF. She declines picking a home health agency from list I provided yesterday.  CSW updated. Weekend RNCM updated.

## 2017-04-24 NOTE — Progress Notes (Signed)
Pt back in room 141 from PACU, post left hip anterior hemiarthroplasty, alert and oriented, distal pulses palpable, pt can wiggle toes. No complains of pain this time. Surgical incision on the left hip clean, dry and intact with wound vac in place on continuous cycle at 125. Ice pack on the incision site. Pt c/o being hungry, started on liquid diet, tolerated well. Daughters and relatives at bedside. Will continue to attend needs and closely monitor.

## 2017-04-25 ENCOUNTER — Encounter
Admission: RE | Admit: 2017-04-25 | Discharge: 2017-04-25 | Disposition: A | Discharge status: Home / Self Care | Payer: Medicare Other | Source: Ambulatory Visit | Attending: Internal Medicine | Admitting: Internal Medicine

## 2017-04-25 LAB — CBC
HCT: 35.4 % (ref 35.0–47.0)
Hemoglobin: 12 g/dL (ref 12.0–16.0)
MCH: 29.5 pg (ref 26.0–34.0)
MCHC: 34 g/dL (ref 32.0–36.0)
MCV: 86.8 fL (ref 80.0–100.0)
Platelets: 142 10*3/uL — ABNORMAL LOW (ref 150–440)
RBC: 4.08 MIL/uL (ref 3.80–5.20)
RDW: 13.8 % (ref 11.5–14.5)
WBC: 7.8 10*3/uL (ref 3.6–11.0)

## 2017-04-25 LAB — BASIC METABOLIC PANEL
Anion gap: 6 (ref 5–15)
BUN: 10 mg/dL (ref 6–20)
CO2: 24 mmol/L (ref 22–32)
Calcium: 9 mg/dL (ref 8.9–10.3)
Chloride: 104 mmol/L (ref 101–111)
Creatinine, Ser: 0.53 mg/dL (ref 0.44–1.00)
GFR calc Af Amer: >60 mL/min (ref >60)
GFR calc non Af Amer: >60 mL/min (ref >60)
Glucose, Bld: 161 mg/dL — ABNORMAL HIGH (ref 65–99)
Potassium: 3.9 mmol/L (ref 3.5–5.1)
Sodium: 134 mmol/L — ABNORMAL LOW (ref 135–145)

## 2017-04-25 LAB — MAGNESIUM: Magnesium: 1.8 mg/dL (ref 1.7–2.4)

## 2017-04-25 LAB — GLUCOSE, CAPILLARY
Glucose-Capillary: 141 mg/dL — ABNORMAL HIGH (ref 65–99)
Glucose-Capillary: 135 mg/dL — ABNORMAL HIGH (ref 65–99)

## 2017-04-25 NOTE — Discharge Instructions (Signed)
Diet: As you were doing prior to hospitalization    Dressing:  Please remove provena negative pressure dressing once battery alarms and apply honey comb dressing. Keep dressing clean and dry at all times.  Activity:  Increase activity slowly as tolerated, but follow the weight bearing instructions below.  No lifting or driving for 4-6 weeks.  Weight Bearing:   Weight bearing as tolerated to left lower extremity  To prevent constipation: you may use a stool softener such as -  Colace (over the counter) 100 mg by mouth twice a day  Drink plenty of fluids (prune juice may be helpful) and high fiber foods Miralax (over the counter) for constipation as needed.    Itching:  If you experience itching with your medications, try taking only a single pain pill, or even half a pain pill at a time.  You may take up to 10 pain pills per day, and you can also use benadryl over the counter for itching or also to help with sleep.   Precautions:  If you experience chest pain or shortness of breath - call 911 immediately for transfer to the hospital emergency department!!  If you develop a fever greater that 101 F, purulent drainage from wound, increased redness or drainage from wound, or calf pain-Call [facility]                                               Follow- Up Appointment:  Please call for an appointment to be seen in 2 weeks at [facility] Smoking cessation

## 2017-04-25 NOTE — Progress Notes (Signed)
Physical Therapy Treatment Patient Details Name: Adriana Sanders MRN: [id number] DOB: [birth year] Today's Date: 04/25/2017    History of Present Illness admitted for acute hospitalization status post fall in home environment; admitted with L femoral neck fracture s/p L anterior hip hemiarthroplasty, WBAT (04/23/17).    PT Comments    Patient progressing well with tolerance for gait training/distance, though continues to be severely limited in distance and gait speed. She appears to be becoming more comfortable with weightbearing on her LLE, no buckling evident while ambulating or other adverse responses. She is able to complete bed level exercises, but would still benefit from short term rehabilitation after discharge from hospital admission.   Follow Up Recommendations  SNF     Equipment Recommendations       Recommendations for Other Services       Precautions / Restrictions Precautions Precautions: Anterior Hip;Fall Restrictions Weight Bearing Restrictions: Yes LLE Weight Bearing: Weight bearing as tolerated Other Position/Activity Restrictions: WBAT    Mobility  Bed Mobility Overal bed mobility: Needs Assistance Bed Mobility: Supine to Sit     Supine to sit: Min assist     General bed mobility comments: Patient required assistance to manage her trunk/torso for transfer.   Transfers Overall transfer level: Needs assistance Equipment used: Rolling walker (2 wheeled) Transfers: Sit to/from Stand Sit to Stand: Min assist;Mod assist         General transfer comment: Patient required elevated bed surface, prolonged time to complete.   Ambulation/Gait Ambulation/Gait assistance: Min guard;Min assist Ambulation Distance (Feet): 20 Feet Assistive device: Rolling walker (2 wheeled) Gait Pattern/deviations: Step-to pattern   Gait velocity interpretation: <1.8 ft/sec, indicative of risk for recurrent falls General Gait Details: Patient demonstrates slow step to pattern,  forward/flexed posture throughout gait with limited step lengths.    Stairs            Wheelchair Mobility    Modified Rankin (Stroke Patients Only)       Balance Overall balance assessment: Needs assistance Sitting-balance support: No upper extremity supported;Feet supported Sitting balance-Leahy Scale: Good     Standing balance support: Bilateral upper extremity supported Standing balance-Leahy Scale: Fair                              Cognition Arousal/Alertness: Awake/alert Behavior During Therapy: WFL for tasks assessed/performed Overall Cognitive Status: Within Functional Limits for tasks assessed                                        Exercises General Exercises - Lower Extremity Ankle Circles/Pumps: AROM;Both;10 reps Long Arc Quad: AROM;Both;10 reps Heel Slides: AROM;Both;10 reps Hip ABduction/ADduction: AAROM;10 reps;Both Straight Leg Raises: AAROM;Both;10 reps    General Comments        Pertinent Vitals/Pain Pain Assessment: Faces Faces Pain Scale: Hurts even more Pain Location: L hip Pain Descriptors / Indicators: Aching;Grimacing;Guarding Pain Intervention(s): Limited activity within patient's tolerance;Monitored during session;Repositioned(RN coming in with medications after session)    Home Living                      Prior Function            PT Goals (current goals can now be found in the care plan section) Acute Rehab PT Goals Patient Stated Goal: to return to daughter's home PT Goal Formulation: With  patient Time For Goal Achievement: 05/08/17 Potential to Achieve Goals: Good Progress towards PT goals: Progressing toward goals    Frequency    BID      PT Plan Current plan remains appropriate    Co-evaluation              AM-PAC PT "6 Clicks" Daily Activity  Outcome Measure  Difficulty turning over in bed (including adjusting bedclothes, sheets and blankets)?: A  Little Difficulty moving from lying on back to sitting on the side of the bed? : A Little Difficulty sitting down on and standing up from a chair with arms (e.g., wheelchair, bedside commode, etc,.)?: A Lot Help needed moving to and from a bed to chair (including a wheelchair)?: A Lot Help needed walking in hospital room?: A Lot Help needed climbing 3-5 steps with a railing? : Total 6 Click Score: 13    End of Session Equipment Utilized During Treatment: Gait belt Activity Tolerance: Patient tolerated treatment well Patient left: with call bell/phone within reach;in chair;with chair alarm set Nurse Communication: Mobility status PT Visit Diagnosis: Muscle weakness (generalized) (M62.81);Pain;Difficulty in walking, not elsewhere classified (R26.2) Pain - Right/Left: Left Pain - part of body: Hip     Time: 0932-1012 PT Time Calculation (min) (ACUTE ONLY): 40 min  Charges:  $Gait Training: 23-37 mins $Therapeutic Exercise: 8-22 mins                    G Codes:  Functional Assessment Tool Used: AM-PAC 6 Clicks Basic Mobility Functional Limitation: Mobility: Walking and moving around    Royce Macadamia PT, DPT, CSCS    04/25/2017, 12:21 PM

## 2017-04-25 NOTE — Progress Notes (Signed)
Report given to [facility], EMS called for transportation.

## 2017-04-25 NOTE — NC FL2 (Signed)
[place] LEVEL OF CARE SCREENING TOOL     IDENTIFICATION  Patient Name: Adriana Sanders Birthdate: 01-12-1941 Sex: female Admission Date (Current Location): 04/22/2017  [place] and [place] Number:  Engineering geologist and Address:  [facility], [address] [place], [place] [address]      Provider Number: [id number]  Attending Physician Name and Address:  Demetrios Loll, MD  Relative Name and Phone Number:  [name] Daughter [phone]     Current Level of Care: Hospital Recommended Level of Care: [place] Prior Approval Number:    Date Approved/Denied:   PASRR Number: [id number]  Discharge Plan: SNF    Current Diagnoses: Patient Active Problem List   Diagnosis Date Noted  . Closed left hip fracture ([place]) 04/22/2017    Orientation RESPIRATION BLADDER Height & Weight     Self, Time, Situation, Place  Normal Continent Weight: 150 lb (68 kg) Height:  5\' 2"  (157.5 cm)  BEHAVIORAL SYMPTOMS/MOOD NEUROLOGICAL BOWEL NUTRITION STATUS      Continent Diet(Carb Modified diet)  AMBULATORY STATUS COMMUNICATION OF NEEDS Skin   Limited Assist Verbally Surgical wounds, Wound Vac(Provena Wound Vac)                       Personal Care Assistance Level of Assistance  Bathing, Feeding, Dressing Bathing Assistance: Limited assistance Feeding assistance: Independent Dressing Assistance: Limited assistance     Functional Limitations Info  Sight, Speech, Hearing Sight Info: Adequate Hearing Info: Adequate Speech Info: Adequate    SPECIAL CARE FACTORS FREQUENCY  PT (By licensed PT)     PT Frequency: 5x a week              Contractures Contractures Info: Not present    Additional Factors Info  Code Status, Allergies, Psychotropic, Insulin Sliding Scale Code Status Info: Full Code Allergies Info: VICODIN HYDROCODONE-ACETAMINOPHEN  Psychotropic Info: FLUoxetine (PROZAC) capsule 40 mg and traZODone  (DESYREL) tablet 100 mg  Insulin Sliding Scale Info: insulin aspart (novoLOG) injection 0-5 Units 3x a day with meals       Current Medications (04/25/2017):  This is the current hospital active medication list Current Facility-Administered Medications  Medication Dose Route Frequency Provider Last Rate Last Dose  . acetaminophen (TYLENOL) tablet 650 mg  650 mg Oral Q4H PRN Hessie Knows, MD       Or  . acetaminophen (TYLENOL) suppository 650 mg  650 mg Rectal Q4H PRN Hessie Knows, MD      . albuterol (PROVENTIL) (2.5 MG/3ML) 0.083% nebulizer solution 2.5 mg  2.5 mg Nebulization Q2H PRN Demetrios Loll, MD      . ALPRAZolam Duanne Moron) tablet 1 mg  1 mg Oral Daily PRN Demetrios Loll, MD      . alum & mag hydroxide-simeth (MAALOX/MYLANTA) 200-200-20 MG/5ML suspension 30 mL  30 mL Oral Q4H PRN Hessie Knows, MD      . atorvastatin (LIPITOR) tablet 10 mg  10 mg Oral Daily Demetrios Loll, MD   10 mg at 04/25/17 1017  . bisacodyl (DULCOLAX) EC tablet 5 mg  5 mg Oral Daily PRN Demetrios Loll, MD   5 mg at 04/24/17 1446  . bisacodyl (DULCOLAX) suppository 10 mg  10 mg Rectal Daily PRN Hessie Knows, MD      . diphenhydrAMINE (BENADRYL) 12.5 MG/5ML elixir 12.5-25 mg  12.5-25 mg Oral Q4H PRN Hessie Knows, MD      . dipyridamole-aspirin (AGGRENOX) 200-25 MG per 12 hr capsule 1 capsule  1 capsule Oral BID Demetrios Loll, MD   1 capsule at 04/25/17 1019  . docusate sodium (COLACE) capsule 100 mg  100 mg Oral BID Demetrios Loll, MD   100 mg at 04/24/17 2142  . enoxaparin (LOVENOX) injection 40 mg  40 mg Subcutaneous Q24H Hessie Knows, MD   40 mg at 04/24/17 2142  . fentaNYL (SUBLIMAZE) injection 25 mcg  25 mcg Intravenous Q5 min PRN Alvin Critchley, MD      . FLUoxetine (PROZAC) capsule 40 mg  40 mg Oral Daily Demetrios Loll, MD   40 mg at 04/25/17 1018  . fluticasone (FLONASE) 50 MCG/ACT nasal spray 2 spray  2 spray Each Nare Daily PRN Demetrios Loll, MD      . lisinopril (PRINIVIL,ZESTRIL) tablet 20 mg  20 mg Oral Daily Demetrios Loll, MD   20  mg at 04/25/17 1018   And  . hydrochlorothiazide (HYDRODIURIL) tablet 25 mg  25 mg Oral Daily Demetrios Loll, MD   25 mg at 04/25/17 1018  . insulin aspart (novoLOG) injection 0-5 Units  0-5 Units Subcutaneous QHS Demetrios Loll, MD      . insulin aspart (novoLOG) injection 0-9 Units  0-9 Units Subcutaneous TID WC Demetrios Loll, MD   1 Units at 04/25/17 [phone]  . magnesium citrate solution 1 Bottle  1 Bottle Oral Once PRN Hessie Knows, MD      . magnesium hydroxide (MILK OF MAGNESIA) suspension 30 mL  30 mL Oral Daily PRN Hessie Knows, MD      . menthol-cetylpyridinium (CEPACOL) lozenge 3 mg  1 lozenge Oral PRN Hessie Knows, MD       Or  . phenol (CHLORASEPTIC) mouth spray 1 spray  1 spray Mouth/Throat PRN Hessie Knows, MD      . methimazole (TAPAZOLE) tablet 5 mg  5 mg Oral Daily Demetrios Loll, MD   5 mg at 04/25/17 1020  . methocarbamol (ROBAXIN) tablet 500 mg  500 mg Oral Q6H PRN Hessie Knows, MD       Or  . methocarbamol (ROBAXIN) 500 mg in dextrose 5 % 50 mL IVPB  500 mg Intravenous Q6H PRN Hessie Knows, MD      . metoprolol tartrate (LOPRESSOR) tablet 100 mg  100 mg Oral BID WC Demetrios Loll, MD   100 mg at 04/25/17 0851  . morphine 2 MG/ML injection 2 mg  2 mg Intravenous Q2H PRN Hessie Knows, MD      . nicotine (NICODERM CQ - dosed in mg/24 hours) patch 14 mg  14 mg Transdermal Daily Demetrios Loll, MD      . ondansetron [facility]) tablet 4 mg  4 mg Oral Q6H PRN Demetrios Loll, MD       Or  . ondansetron [facility]) injection 4 mg  4 mg Intravenous Q6H PRN Demetrios Loll, MD      . ondansetron [facility] injection 4 mg  4 mg Intravenous Once PRN Alvin Critchley, MD      . oxyCODONE (Oxy IR/ROXICODONE) immediate release tablet 10 mg  10 mg Oral Q3H PRN Hessie Knows, MD   10 mg at 04/25/17 0603  . oxyCODONE (Oxy IR/ROXICODONE) immediate release tablet 5 mg  5 mg Oral Q3H PRN Hessie Knows, MD   5 mg at 04/24/17 0630  . pneumococcal 23 valent vaccine (PNU-IMMUNE) injection 0.5 mL  0.5 mL Intramuscular Tomorrow-1000  Demetrios Loll, MD      . senna-docusate (Senokot-S) tablet 1 tablet  1 tablet Oral QHS PRN Demetrios Loll, MD      .  traZODone (DESYREL) tablet 100 mg  100 mg Oral QHS Demetrios Loll, MD   100 mg at 04/24/17 2142  . vitamin B-12 (CYANOCOBALAMIN) tablet 500 mcg  500 mcg Oral Daily Demetrios Loll, MD   500 mcg at 04/25/17 1019  . zolpidem (AMBIEN) tablet 5 mg  5 mg Oral QHS PRN Hessie Knows, MD         Discharge Medications: Please see discharge summary for a list of discharge medications.  Relevant Imaging Results:  Relevant Lab Results:   Additional Information SSN [id number]  Ross Ludwig, [place]

## 2017-04-25 NOTE — Progress Notes (Signed)
EMS here to transport pt. 

## 2017-04-25 NOTE — Discharge Summary (Signed)
[place] at [place] NAME: Adriana Sanders    MR#:  [id number]  DATE OF BIRTH:  07-Apr-1940  DATE OF ADMISSION:  04/22/2017   ADMITTING PHYSICIAN: Demetrios Loll, MD  DATE OF DISCHARGE:  04/25/2017  PRIMARY CARE PHYSICIAN: Patient, No Pcp Per   ADMISSION DIAGNOSIS:  Closed fracture of left hip, initial encounter ([place]) [S72.002A] Fall in home, initial encounter [W19.XXXA, G83.662] DISCHARGE DIAGNOSIS:  Active Problems:   Closed left hip fracture ([place])  SECONDARY DIAGNOSIS:   Past Medical History:  Diagnosis Date  . Diabetes mellitus without complication ([place])   . Hyperlipemia   . Hypertension   . Stroke [facility])    HOSPITAL COURSE:   Left hip fracture. S/p Hip surgery, resumed aggrenox, continue Lopressor. PT and DVT prophylaxis.  Hypokalemia.  improved with potassium supplement Hypomagnesemia.  improved with magnesium IV Anemia due to postop acute blood loss. Improved, stable hemoglobin. Hypertension. Continue hypertension medication. Diabetes. on sliding scale. resume metformin. History of CVA. resume Aggrenox, continue statin. Tobacco abuse. Smoking cessation was counseled for 3-4 minutes, nicotine patch. PT evaluation suggest SNF. DISCHARGE CONDITIONS:  Stable, discharge to SNF today. CONSULTS OBTAINED:  Treatment Team:  Earnestine Leys, MD Hessie Knows, MD DRUG ALLERGIES:   Allergies  Allergen Reactions  . Vicodin [Hydrocodone-Acetaminophen]    DISCHARGE MEDICATIONS:   Allergies as of 04/25/2017      Reactions   Vicodin [hydrocodone-acetaminophen]       Medication List    TAKE these medications   AGGRENOX 200-25 MG 12hr capsule Generic drug:  dipyridamole-aspirin Take 1 capsule by mouth 2 (two) times daily.   ALPRAZolam 1 MG tablet Commonly known as:  XANAX Take 1 mg by mouth daily as needed.   atorvastatin 10 MG tablet Commonly known as:  LIPITOR Take 10 mg by mouth daily.   bisacodyl 5 MG EC  tablet Commonly known as:  DULCOLAX Take 1 tablet (5 mg total) by mouth daily as needed for moderate constipation.   celecoxib 200 MG capsule Commonly known as:  CELEBREX Take 200 mg by mouth daily as needed.   enoxaparin 40 MG/0.4ML injection Commonly known as:  LOVENOX Inject 0.4 mLs (40 mg total) into the skin daily for 14 days.   FLUoxetine 40 MG capsule Commonly known as:  PROZAC Take 40 mg by mouth daily.   fluticasone 50 MCG/ACT nasal spray Commonly known as:  FLONASE Place 2 sprays into both nostrils daily.   lisinopril-hydrochlorothiazide 20-25 MG tablet Commonly known as:  PRINZIDE,ZESTORETIC Take 1 tablet by mouth daily.   metFORMIN 500 MG tablet Commonly known as:  GLUCOPHAGE Take 500 mg by mouth 2 (two) times daily with a meal.   methimazole 5 MG tablet Commonly known as:  TAPAZOLE Take 5 mg by mouth daily.   metoprolol tartrate 100 MG tablet Commonly known as:  LOPRESSOR Take 100 mg by mouth 2 (two) times daily with a meal.   oxyCODONE 5 MG immediate release tablet Commonly known as:  Oxy IR/ROXICODONE Take 1-2 tablets (5-10 mg total) by mouth every 4 (four) hours as needed for moderate pain ((score 4 to 6)).   traZODone 100 MG tablet Commonly known as:  DESYREL Take 100 mg by mouth at bedtime.   vitamin B-12 500 MCG tablet Commonly known as:  CYANOCOBALAMIN Take 500 mcg by mouth daily.          If you experience worsening of your admission symptoms, develop shortness of breath, life threatening emergency, suicidal  or homicidal thoughts you must seek medical attention immediately by calling 911 or calling your MD immediately  if symptoms less severe.  You Must read complete instructions/literature along with all the possible adverse reactions/side effects for all the Medicines you take and that have been prescribed to you. Take any new Medicines after you have completely understood and accpet all the possible adverse reactions/side effects.    Please note  You were cared for by a hospitalist during your hospital stay. If you have any questions about your discharge medications or the care you received while you were in the hospital after you are discharged, you can call the unit and asked to speak with the hospitalist on call if the hospitalist that took care of you is not available. Once you are discharged, your primary care physician will handle any further medical issues. Please note that NO REFILLS for any discharge medications will be authorized once you are discharged, as it is imperative that you return to your primary care physician (or establish a relationship with a primary care physician if you do not have one) for your aftercare needs so that they can reassess your need for medications and monitor your lab values.    On the day of Discharge:  VITAL SIGNS:  Blood pressure (!) 164/59, pulse 62, temperature 99.3 F (37.4 C), temperature source Oral, resp. rate 18, height 5\' 2"  (1.575 m), weight 150 lb (68 kg), SpO2 91 %. PHYSICAL EXAMINATION:  GENERAL:  77 y.o.-year-old patient lying in the bed with no acute distress.  EYES: Pupils equal, round, reactive to light and accommodation. No scleral icterus. Extraocular muscles intact.  HEENT: Head atraumatic, normocephalic. Oropharynx and nasopharynx clear.  NECK:  Supple, no jugular venous distention. No thyroid enlargement, no tenderness.  LUNGS: Normal breath sounds bilaterally, no wheezing, rales,rhonchi or crepitation. No use of accessory muscles of respiration.  CARDIOVASCULAR: S1, S2 normal. No murmurs, rubs, or gallops.  ABDOMEN: Soft, non-tender, non-distended. Bowel sounds present. No organomegaly or mass.  EXTREMITIES: No pedal edema, cyanosis, or clubbing.  NEUROLOGIC: Cranial nerves II through XII are intact. Muscle strength 5/5 in all extremities. Sensation intact. Gait not checked.  PSYCHIATRIC: The patient is alert and oriented x 3.  SKIN: No obvious rash,  lesion, or ulcer.  DATA REVIEW:   CBC Recent Labs  Lab 04/25/17 0407  WBC 7.8  HGB 12.0  HCT 35.4  PLT 142*    Chemistries  Recent Labs  Lab 04/25/17 0407  NA 134*  K 3.9  CL 104  CO2 24  GLUCOSE 161*  BUN 10  CREATININE 0.53  CALCIUM 9.0  MG 1.8     Microbiology Results  Results for orders placed or performed during the hospital encounter of 04/22/17  MRSA PCR Screening     Status: None   Collection Time: 04/22/17 10:58 PM  Result Value Ref Range Status   MRSA by PCR NEGATIVE NEGATIVE Final    Comment:        The GeneXpert MRSA Assay (FDA approved for NASAL specimens only), is one component of a comprehensive MRSA colonization surveillance program. It is not intended to diagnose MRSA infection nor to guide or monitor treatment for MRSA infections. Performed at [facility], [address] [place], [place] [address]     RADIOLOGY:  No results found.   Management plans discussed with the patient, family and they are in agreement.  CODE STATUS: Full Code   TOTAL TIME TAKING CARE OF THIS PATIENT: 68  minutes.    Demetrios Loll M.D on 04/25/2017 at 1:56 PM  Between 7am to 6pm - Pager - [phone]  After 6pm go to [email or web address] - Proofreader  [facility]  Office  [phone]  CC: Primary care physician; Patient, No Pcp Per   Note: This dictation was prepared with Dragon dictation along with smaller phrase technology. Any transcriptional errors that result from this process are unintentional.

## 2017-04-25 NOTE — Clinical Social Work Note (Signed)
Clinical Social Work Assessment  Patient Details  Name: Adriana Sanders MRN: [id number] Date of Birth: 04-08-1940  Date of referral:  04/25/17               Reason for consult:  Facility Placement                Permission sought to share information with:  Facility Sport and exercise psychologist, Family Supports Permission granted to share information::  Yes, Verbal Permission Granted  Name::     Adriana Sanders [phone]   Agency::  SNF admissions  Relationship::     Contact Information:     Housing/Transportation Living arrangements for the past 2 months:  Single Family Home Source of Information:  Patient Patient Interpreter Needed:  None Criminal Activity/Legal Involvement Pertinent to Current Situation/Hospitalization:  No - Comment as needed Significant Relationships:  Adult Children Lives with:  Self Do you feel safe going back to the place where you live?  No Need for family participation in patient care:  No (Coment)  Care giving concerns:  Patient feels she need short term rehab before she is able to return back home.   Social Worker assessment / plan:  Patient is a 77 year old female who is widowed and lives alone.  Patient lives in [place], but is currently visiting her Sanders in [place].  Patient states she has not been to rehab before CSW explained process, what to expect, and how insurance will pay for stay at SNF.  Patient was explained role of CSW, how what to expect day of discharge.  Patient and CSW discussed what happens once she has completed her rehab at [facility].  Patient did not have any other concerns or questions.  Patient was appreciative of information given, and gave CSW permission to begin bed search in [facility].  Employment status:  Retired Forensic scientist:  Medicare PT Recommendations:  [place] / Referral to community resources:  [place]  Patient/Family's Response to care:  Patient in  agreement about going to SNF for short term rehab.  Patient/Family's Understanding of and Emotional Response to Diagnosis, Current Treatment, and Prognosis:  Patient aware of current treatment plan and prognosis.  Patient is hopeful that she will not have to be at [facility] for very long.  Emotional Assessment Appearance:  Appears stated age Attitude/Demeanor/Rapport:    Affect (typically observed):  Appropriate, Calm, Pleasant Orientation:  Oriented to Self, Oriented to Place, Oriented to  Time, Oriented to Situation Alcohol / Substance use:  Not Applicable Psych involvement (Current and /or in the community):  No (Comment)  Discharge Needs  Concerns to be addressed:  Lack of Support, Care Coordination Readmission within the last 30 days:    Current discharge risk:  Lives alone, Lack of support system Barriers to Discharge:  No Barriers Identified   Ross Ludwig, LCSWA 04/25/2017, 11:06 AM

## 2017-04-25 NOTE — Clinical Social Work Note (Signed)
Patient to be d/c'ed today to [facility].  Patient and family agreeable to plans will transport via ems RN to call report.  Jadzia Ibsen, MSW, LCSWA [phone]  

## 2017-04-25 NOTE — Progress Notes (Signed)
Subjective: 2 Days Post-Op Procedure(s) (LRB): ANTERIOR APPROACH HEMI HIP ARTHROPLASTY (Left) Patient reports pain as 7 on 0-10 scale.   Patient is well, and has had no acute complaints or problems  Fever of 99.3 this AM. Denies any urinary symptoms today. Denies any CP, SOB, ABD pain. We will start therapy today.   Objective: Vital signs in last 24 hours: Temp:  [99.1 F (37.3 C)-99.6 F (37.6 C)] 99.3 F (37.4 C) (02/23 0750) Pulse Rate:  [74-81] 78 (02/23 0750) Resp:  [18] 18 (02/23 0750) BP: (132-149)/(62-70) 149/70 (02/23 0750) SpO2:  [90 %-97 %] 97 % (02/23 0750)  Intake/Output from previous day: 02/22 0701 - 02/23 0700 In: 1200 [P.O.:1200] Out: 200 [Urine:200] Intake/Output this shift: No intake/output data recorded.  Recent Labs    04/22/17 1842 04/23/17 0317 04/24/17 0333 04/25/17 0407  HGB 14.6 13.3 11.8* 12.0   Recent Labs    04/24/17 0333 04/25/17 0407  WBC 6.3 7.8  RBC 4.01 4.08  HCT 34.8* 35.4  PLT 144* 142*   Recent Labs    04/24/17 0333 04/25/17 0407  NA 139 134*  K 3.0* 3.9  CL 109 104  CO2 22 24  BUN 14 10  CREATININE 0.74 0.53  GLUCOSE 142* 161*  CALCIUM 8.5* 9.0   Recent Labs    04/22/17 1842  INR 0.95    EXAM General - Patient is Alert, Appropriate and Oriented Extremity - Neurovascular intact Sensation intact distally Intact pulses distally Dorsiflexion/Plantar flexion intact No cellulitis present Compartment soft Dressing - dressing C/D/I and no drainage.  Provena negative pressure dressing is intact with no drainage. Ecchymosis present along the lateral aspect of the left leg. Motor Function - intact, moving foot and toes well on exam.   Abdomen soft with normal BS.  Past Medical History:  Diagnosis Date  . Diabetes mellitus without complication ([place])   . Hyperlipemia   . Hypertension   . Stroke ([place])     Assessment/Plan:   2 Days Post-Op Procedure(s) (LRB): ANTERIOR APPROACH HEMI HIP ARTHROPLASTY  (Left) Active Problems:   Closed left hip fracture ([facility])   Acute post op blood loss anemia    Hypokalemia  Estimated body mass index is 27.44 kg/m as calculated from the following:   Height as of this encounter: 5\' 2"  (1.575 m).   Weight as of this encounter: 68 kg (150 lb). Advance diet Up with therapy   Labs reviewed this AM. Pt has had a BM since yesterday. Hypokalemia resolved, K+ 3.9. Hg 12.0 Care management to assist with discharge planning. Temp of 99.3 this AM, WBC 7.8.  WIll obtain UA to ensure no signs of UTI.  Day of discharge, please attach Provena negative pressure dressing.  Negative pressure dressing will need to be removed and switched to honey, dressing 7 days after day of discharge Continue with Lovenox 40 mg subcutaneous a day of discharge  DVT Prophylaxis - Lovenox, Foot Pumps and SCD Weight-Bearing as tolerated to left leg  J. Cameron Proud, PA-C [place] 04/25/2017, 8:48 AM

## 2017-04-25 NOTE — Clinical Social Work Placement (Signed)
   CLINICAL SOCIAL WORK PLACEMENT  NOTE  Date:  04/25/2017  Patient Details  Name: Adriana Sanders MRN: [id number] Date of Birth: 1941/01/02  Clinical Social Work is seeking post-discharge placement for this patient at the [place] level of care (*CSW will initial, date and re-position this form in  chart as items are completed):  Yes   Patient/family provided with [place] Work Department's list of facilities offering this level of care within the geographic area requested by the patient (or if unable, by the patient's family).  Yes   Patient/family informed of their freedom to choose among providers that offer the needed level of care, that participate in Medicare, Medicaid or managed care program needed by the patient, have an available bed and are willing to accept the patient.  Yes   Patient/family informed of 's ownership interest in [facility] and [facility], as well as of the fact that they are under no obligation to receive care at these facilities.  PASRR submitted to EDS on 04/25/17     PASRR number received on 04/25/17     Existing PASRR number confirmed on       FL2 transmitted to all facilities in geographic area requested by pt/family on 04/25/17     FL2 transmitted to all facilities within larger geographic area on       Patient informed that his/her managed care company has contracts with or will negotiate with certain facilities, including the following:        Yes   Patient/family informed of bed offers received.  Patient chooses bed at [facility]     Physician recommends and patient chooses bed at      Patient to be transferred to [facility] on 04/25/17.  Patient to be transferred to facility by [facility]     Patient family notified on 04/25/17 of transfer.  Name of family member notified:  Patient's daughter Linus Orn was at bedside.     PHYSICIAN Please sign FL2     Additional  Comment:    _______________________________________________ Ross Ludwig, LCSWA 04/25/2017, 7:53 PM

## 2017-04-27 ENCOUNTER — Other Ambulatory Visit
Admission: RE | Admit: 2017-04-27 | Discharge: 2017-04-27 | Disposition: A | Discharge status: Home / Self Care | Payer: Medicare Other | Source: Ambulatory Visit | Attending: Internal Medicine | Admitting: Internal Medicine

## 2017-04-27 DIAGNOSIS — R300 Dysuria: Secondary | ICD-10-CM | POA: Diagnosis present

## 2017-04-27 DIAGNOSIS — M8000XD Age-related osteoporosis with current pathological fracture, unspecified site, subsequent encounter for fracture with routine healing: Secondary | ICD-10-CM | POA: Insufficient documentation

## 2017-04-27 DIAGNOSIS — R410 Disorientation, unspecified: Secondary | ICD-10-CM | POA: Diagnosis present

## 2017-04-27 LAB — SURGICAL PATHOLOGY

## 2017-04-27 LAB — URINALYSIS, COMPLETE (UACMP) WITH MICROSCOPIC
Bilirubin Urine: NEGATIVE
Glucose, UA: NEGATIVE mg/dL
Ketones, ur: NEGATIVE mg/dL
Leukocytes, UA: NEGATIVE
Nitrite: NEGATIVE
Protein, ur: NEGATIVE mg/dL
Specific Gravity, Urine: 1.02 (ref 1.005–1.030)
pH: 5 (ref 5.0–8.0)

## 2017-04-27 NOTE — Anesthesia Postprocedure Evaluation (Signed)
Anesthesia Post Note  Patient: Adriana Sanders  Procedure(s) Performed: ANTERIOR APPROACH HEMI HIP ARTHROPLASTY (Left )  Patient location during evaluation: Other Anesthesia Type: Regional Level of consciousness: awake and alert and oriented Pain management: pain level controlled Vital Signs Assessment: post-procedure vital signs reviewed and stable Respiratory status: spontaneous breathing Cardiovascular status: blood pressure returned to baseline Anesthetic complications: no Comments: Patient discharged before being seen.  No apparent anesthetic issues according to staff.  Patient tolerated the procedure well.     Last Vitals:  Vitals:   04/25/17 1216 04/25/17 1541  BP: (!) 164/59 (!) 158/64  Pulse: 62 71  Resp:  18  Temp:  36.9 C  SpO2: 91% 94%    Last Pain:  Vitals:   04/25/17 1541  TempSrc: Oral  PainSc:                  Chesni Vos

## 2017-04-28 LAB — URINE CULTURE: Culture: <10000 — AB

## 2017-04-29 ENCOUNTER — Other Ambulatory Visit: Payer: Self-pay

## 2017-04-29 MED ORDER — OXYCODONE HCL 5 MG PO TABS: 5.0000 mg | ORAL_TABLET | ORAL | 0 refills | Status: DC | PRN

## 2017-04-29 NOTE — Telephone Encounter (Signed)
Rx sent to [facility] phone : [phone] , fax : [phone]  

## 2017-05-01 ENCOUNTER — Encounter
Admission: RE | Admit: 2017-05-01 | Discharge: 2017-05-01 | Disposition: A | Discharge status: Home / Self Care | Payer: Medicare Other | Source: Ambulatory Visit | Attending: Internal Medicine | Admitting: Internal Medicine

## 2017-05-08 ENCOUNTER — Encounter: Payer: Self-pay | Admitting: Gerontology

## 2017-05-08 ENCOUNTER — Non-Acute Institutional Stay (SKILLED_NURSING_FACILITY): Payer: Medicare Other | Admitting: Gerontology

## 2017-05-08 DIAGNOSIS — S72002D Fracture of unspecified part of neck of left femur, subsequent encounter for closed fracture with routine healing: Secondary | ICD-10-CM | POA: Diagnosis not present

## 2017-05-08 DIAGNOSIS — Z96649 Presence of unspecified artificial hip joint: Secondary | ICD-10-CM | POA: Diagnosis not present

## 2017-05-08 NOTE — Progress Notes (Signed)
Location:   [place] [facility] [place] Room Number: [place] of Service:  SNF [phone])  Provider: Toni Arthurs, NP-C  PCP: Patient, No Pcp Per Patient Care Team: Patient, No Pcp Per as PCP - General (General Practice)  Extended Emergency Contact Information Primary Emergency Contact: [place] Phone: [phone] Relation: Daughter  Code Status: FULL Goals of care:  Advanced Directive information Advanced Directives 05/08/2017  Does Patient Have a Medical Advance Directive? No  Would patient like information on creating a medical advance directive? No - Patient declined     Allergies  Allergen Reactions  . Hydrocodone-Acetaminophen     Other reaction(s): Unknown  . Vicodin [Hydrocodone-Acetaminophen]     Chief Complaint  Patient presents with  . Discharge Note    Discharged at SNF    HPI:  77 y.o. female seen today for discharge evaluation. Pt was admitted to the facility for rehab following left hip fracture with subsequent Left Hip Hemiarthoplasty. Incision is well approximated. DSG CDI. No redness or drainage. Calves soft, supple. Negative Homan's sign.Pt is ambulating with rolling walker. Pt reports her appetite is good, voiding well and having regular BMs. VSS. No other complaints.     Past Medical History:  Diagnosis Date  . Anxiety   . Depression   . Diabetes mellitus without complication ([place])   . Hyperlipemia   . Hypertension   . Panic disorder   . Stroke [facility]) 2001    Past Surgical History:  Procedure Laterality Date  . ANTERIOR APPROACH HEMI HIP ARTHROPLASTY Left 04/23/2017   Procedure: ANTERIOR APPROACH HEMI HIP ARTHROPLASTY;  Surgeon: Hessie Knows, MD;  Location: [facility] ORS;  Service: Orthopedics;  Laterality: Left;      reports that she has been smoking cigarettes.  She has been smoking about 1.00 pack per day. She uses smokeless tobacco. She reports that she does not drink alcohol or use  drugs. Social History   Socioeconomic History  . Marital status: Divorced    Spouse name: Not on file  . Number of children: 2  . Years of education: 12+  . Highest education level: Some college, no degree  Social Needs  . Financial resource strain: Not on file  . Food insecurity - worry: Not on file  . Food insecurity - inability: Not on file  . Transportation needs - medical: Not on file  . Transportation needs - non-medical: Not on file  Occupational History  . Not on file  Tobacco Use  . Smoking status: Current Every Day Smoker    Packs/day: 1.00    Types: Cigarettes  . Smokeless tobacco: Current User  Substance and Sexual Activity  . Alcohol use: No  . Drug use: No  . Sexual activity: Not on file  Other Topics Concern  . Not on file  Social History Narrative  . Not on file   Functional Status Survey:    Allergies  Allergen Reactions  . Hydrocodone-Acetaminophen     Other reaction(s): Unknown  . Vicodin [Hydrocodone-Acetaminophen]     Pertinent  Health Maintenance Due  Topic Date Due  . COLONOSCOPY  07/02/1991  . DEXA SCAN  07/02/2006  . PNA vac Low Risk Adult (1 of 2 - PCV13) 07/02/2006  . INFLUENZA VACCINE  10/01/2016    Medications: Allergies as of 05/08/2017      Reactions   Hydrocodone-acetaminophen    Other reaction(s): Unknown   Vicodin [hydrocodone-acetaminophen]       Medication List  Accurate as of 05/08/17 11:19 AM. Always use your most recent med list.          ALPRAZolam 1 MG tablet Commonly known as:  XANAX Take 1 mg by mouth daily as needed. Anxiety. GIVE ONE HOUR APART FROM OXYCODONE   atorvastatin 10 MG tablet Commonly known as:  LIPITOR Take 10 mg by mouth daily.   bisacodyl 5 MG EC tablet Commonly known as:  DULCOLAX Take 1 tablet (5 mg total) by mouth daily as needed for moderate constipation.   Cholecalciferol 4000 units Caps Take 1 capsule by mouth daily.   enoxaparin 40 MG/0.4ML injection Commonly known as:   LOVENOX Inject 0.4 mLs (40 mg total) into the skin daily for 14 days.   FLUoxetine 40 MG capsule Commonly known as:  PROZAC Take 40 mg by mouth daily.   fluticasone 50 MCG/ACT nasal spray Commonly known as:  FLONASE Place 2 sprays into both nostrils daily.   lisinopril-hydrochlorothiazide 20-25 MG tablet Commonly known as:  PRINZIDE,ZESTORETIC Take 1 tablet by mouth daily.   metFORMIN 500 MG tablet Commonly known as:  GLUCOPHAGE Take 500 mg by mouth 2 (two) times daily with a meal.   methimazole 5 MG tablet Commonly known as:  TAPAZOLE Take 5 mg by mouth daily.   metoprolol tartrate 100 MG tablet Commonly known as:  LOPRESSOR Take 100 mg by mouth 2 (two) times daily with a meal.   oxyCODONE 5 MG immediate release tablet Commonly known as:  Oxy IR/ROXICODONE Take 1-2 tablets (5-10 mg total) by mouth every 4 (four) hours as needed for moderate pain ((score 4 to 6)).   traMADol 50 MG tablet Commonly known as:  ULTRAM Take 50-100 mg by mouth every 4 (four) hours as needed.   traZODone 100 MG tablet Commonly known as:  DESYREL Take 100 mg by mouth at bedtime.   vitamin B-12 500 MCG tablet Commonly known as:  CYANOCOBALAMIN Take 500 mcg by mouth daily.       Review of Systems  Constitutional: Negative for activity change, appetite change, chills, diaphoresis and fever.  HENT: Negative for congestion, mouth sores, nosebleeds, postnasal drip, sneezing, sore throat, trouble swallowing and voice change.   Respiratory: Negative for apnea, cough, choking, chest tightness, shortness of breath and wheezing.   Cardiovascular: Negative for chest pain, palpitations and leg swelling.  Gastrointestinal: Negative for abdominal distention, abdominal pain, constipation, diarrhea and nausea.  Genitourinary: Negative for difficulty urinating, dysuria, frequency and urgency.  Musculoskeletal: Positive for arthralgias (typical arthritis) and gait problem. Negative for back pain and  myalgias.  Skin: Positive for wound. Negative for color change, pallor and rash.  Neurological: Positive for weakness. Negative for dizziness, tremors, syncope, speech difficulty, numbness and headaches.  Psychiatric/Behavioral: Negative for agitation and behavioral problems.  All other systems reviewed and are negative.   Vitals:   05/08/17 1100  BP: 106/75  Pulse: 63  Resp: 16  Temp: 98.4 F (36.9 C)  TempSrc: Oral  SpO2: 98%  Weight: 156 lb 4.8 oz (70.9 kg)  Height: 5\' 2"  (1.575 m)   Body mass index is 28.59 kg/m. Physical Exam  Constitutional: She is oriented to person, place, and time. Vital signs are normal. She appears well-developed and well-nourished. She is active and cooperative. She does not appear ill. No distress.  HENT:  Head: Normocephalic and atraumatic.  Mouth/Throat: Uvula is midline, oropharynx is clear and moist and mucous membranes are normal. Mucous membranes are not pale, not dry and not cyanotic.  Eyes:  Pupils are equal, round, and reactive to light. Conjunctivae, EOM and lids are normal.  Neck: Trachea normal, normal range of motion and full passive range of motion without pain. Neck supple. No JVD present. No tracheal deviation, no edema and no erythema present. No thyromegaly present.  Cardiovascular: Normal rate, regular rhythm, normal heart sounds, intact distal pulses and normal pulses. Exam reveals no gallop, no distant heart sounds and no friction rub.  No murmur heard. Pulses:      Dorsalis pedis pulses are 2+ on the right side, and 2+ on the left side.  No edema  Pulmonary/Chest: Effort normal and breath sounds normal. No accessory muscle usage. No respiratory distress. She has no decreased breath sounds. She has no wheezes. She has no rhonchi. She has no rales. She exhibits no tenderness.  Abdominal: Soft. Normal appearance and bowel sounds are normal. She exhibits no distension and no ascites. There is no tenderness.  Musculoskeletal: She  exhibits no edema.       Left hip: She exhibits decreased range of motion, decreased strength, tenderness and laceration.  Expected osteoarthritis, stiffness; Bilateral Calves soft, supple. Negative Homan's Sign. B- pedal pulses equal  Neurological: She is alert and oriented to person, place, and time. She has normal strength.  Skin: Skin is warm and dry. Laceration (left hip incision) noted. She is not diaphoretic. No cyanosis. No pallor. Nails show no clubbing.  Psychiatric: She has a normal mood and affect. Her speech is normal and behavior is normal. Judgment and thought content normal. Cognition and memory are normal.  Nursing note and vitals reviewed.   Labs reviewed: Basic Metabolic Panel: Recent Labs    04/23/17 0317 04/24/17 0333 04/25/17 0407  NA 138 139 134*  K 3.5 3.0* 3.9  CL 104 109 104  CO2 26 22 24   GLUCOSE 131* 142* 161*  BUN 18 14 10   CREATININE 0.96 0.74 0.53  CALCIUM 9.2 8.5* 9.0  MG  --  1.6* 1.8   Liver Function Tests: No results for input(s): AST, ALT, ALKPHOS, BILITOT, PROT, ALBUMIN in the last 8760 hours. No results for input(s): LIPASE, AMYLASE in the last 8760 hours. No results for input(s): AMMONIA in the last 8760 hours. CBC: Recent Labs    04/22/17 1842 04/23/17 0317 04/24/17 0333 04/25/17 0407  WBC 9.6 6.5 6.3 7.8  NEUTROABS 8.3*  --   --   --   HGB 14.6 13.3 11.8* 12.0  HCT 42.9 39.2 34.8* 35.4  MCV 86.8 86.9 86.6 86.8  PLT 175 166 144* 142*   Cardiac Enzymes: Recent Labs    04/22/17 1842  CKTOTAL 47   BNP: Invalid input(s): POCBNP CBG: Recent Labs    04/24/17 2121 04/25/17 0804 04/25/17 1242  GLUCAP 136* 141* 135*    Procedures and Imaging Studies During Stay: Ct Head Wo Contrast  Result Date: 04/22/2017 CLINICAL DATA:  Fall at home. EXAM: CT HEAD WITHOUT CONTRAST TECHNIQUE: Contiguous axial images were obtained from the base of the skull through the vertex without intravenous contrast. COMPARISON:  None. FINDINGS:  Brain: Mild chronic ischemic white matter disease is noted. No mass effect or midline shift is noted. Ventricular size is within normal limits. There is no evidence of mass lesion, hemorrhage or acute infarction. Vascular: No hyperdense vessel or unexpected calcification. Skull: Normal. Negative for fracture or focal lesion. Sinuses/Orbits: Bilateral ethmoid and maxillary sinusitis is noted. Other: None. IMPRESSION: Mild chronic ischemic white matter disease. No acute intracranial abnormality seen. Electronically Signed   By:  Marijo Conception, M.D.   On: 04/22/2017 19:05   Dg Chest Portable 1 View  Result Date: 04/22/2017 CLINICAL DATA:  Hip fracture EXAM: PORTABLE CHEST 1 VIEW COMPARISON:  None. FINDINGS: Asymmetric opacity in the left thorax felt related to overlying soft tissue. No acute pulmonary infiltrate or effusion. Cardiomediastinal silhouette within normal limits. Mild aortic atherosclerosis. No pneumothorax. IMPRESSION: No active disease. Electronically Signed   By: Donavan Foil M.D.   On: 04/22/2017 19:42   Dg Knee Complete 4 Views Left  Result Date: 04/22/2017 CLINICAL DATA:  Fall with hip pain EXAM: LEFT KNEE - COMPLETE 4+ VIEW COMPARISON:  None. FINDINGS: No acute displaced fracture or malalignment is seen. Mild patellofemoral degenerative changes. Moderate degenerative changes of the medial compartment and mild degenerative changes of the lateral compartment. Joint space calcifications. No significant effusion. Popliteal vascular calcification IMPRESSION: 1. No acute osseous abnormality 2. 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   On: 04/22/2017 19:42   Dg Hip Operative Unilat W Or W/o Pelvis Left  Result Date: 04/23/2017 CLINICAL DATA:  Left hip replacement EXAM: OPERATIVE left HIP (WITH PELVIS IF PERFORMED) 2 VIEWS TECHNIQUE: Fluoroscopic spot image(s) were submitted for interpretation post-operatively. COMPARISON:  Radiograph  04/22/2016 FINDINGS: Two low resolution intraoperative spot views of the left hip. Total fluoroscopy time was 12 seconds. There is a left hip replacement with normal alignment IMPRESSION: Intraoperative fluoroscopic assistance provided during left hip replacement Electronically Signed   By: Donavan Foil M.D.   On: 04/23/2017 21:14   Dg Hip Unilat W Or W/o Pelvis 2-3 Views Left  Result Date: 04/23/2017 CLINICAL DATA:  Postop EXAM: DG HIP (WITH OR WITHOUT PELVIS) 2-3V LEFT COMPARISON:  04/22/2017 FINDINGS: Interim left hip replacement with normal alignment. Cutaneous staples and small amount of soft tissue gas laterally. Pubic symphysis and rami are intact IMPRESSION: Interval left hip replacement with expected postsurgical changes Electronically Signed   By: Donavan Foil M.D.   On: 04/23/2017 22:07   Dg Hip Unilat W Or Wo Pelvis 2-3 Views Left  Result Date: 04/22/2017 CLINICAL DATA:  Hip pain after fall EXAM: DG HIP (WITH OR WITHOUT PELVIS) 2-3V LEFT COMPARISON:  None. FINDINGS: Mild SI joint degenerative changes. Pubic symphysis and rami are intact. The right femoral head projects in joint. Acute minimally displaced left femoral neck fracture. Femoral head alignment is within normal limits. IMPRESSION: 1. Acute left femoral neck fracture.  No femoral head dislocation. Electronically Signed   By: Donavan Foil M.D.   On: 04/22/2017 19:41    Assessment/Plan:    Closed fracture of left hip with routine healing, subsequent encounter S/P hip hemiarthroplasty  Continue PT/OT  Continue exercises as taught by PT/OT  Continue ice pack to the hip pr for pain or edema  Skin care per protocol  Continue Oxycodone 5 mg 1-2 tablets po Q 4 hours prn  Continue Tramadol 50 mg 1-2 tablets po Q 4 hours prn pain  Follow up with orthopedist asap after discharge for continuity of care  Patient is being discharged with the following home health services: HHPT/OT   Patient is being discharged with the  following durable medical equipment:  Rolling walkfer, [facility], hospital bed  Patient has been advised to f/u with their PCP in 1-2 weeks to bring them up to date on their rehab stay.  Social services at facility was responsible for arranging this appointment.  Pt was provided with a 30 day supply  of prescriptions for medications and refills must be obtained from their PCP.  For controlled substances, a more limited supply may be provided adequate until PCP appointment only.  Future labs/tests needed:    Family/ staff Communication:  Total Time:  Documentation:  Face to Face:  Family/Phone:  Vikki Ports, NP-C [facility] [address], [place] [address] Cell Phone (Mon-Fri 8am-5pm):  [phone] On Call:  [phone] & follow prompts after 5pm & weekends Office Phone:  [phone] Office Fax:  [phone]

## 2017-06-13 DIAGNOSIS — Z96649 Presence of unspecified artificial hip joint: Secondary | ICD-10-CM | POA: Insufficient documentation

## 2018-03-03 DIAGNOSIS — Z85850 Personal history of malignant neoplasm of thyroid: Secondary | ICD-10-CM | POA: Insufficient documentation

## 2018-03-03 DIAGNOSIS — Z923 Personal history of irradiation: Secondary | ICD-10-CM | POA: Insufficient documentation

## 2018-12-15 ENCOUNTER — Other Ambulatory Visit: Payer: Self-pay | Admitting: Neurology

## 2018-12-15 ENCOUNTER — Other Ambulatory Visit (HOSPITAL_COMMUNITY): Payer: Self-pay | Admitting: Neurology

## 2018-12-15 DIAGNOSIS — G20 Parkinson's disease: Secondary | ICD-10-CM

## 2018-12-27 ENCOUNTER — Ambulatory Visit (HOSPITAL_COMMUNITY): Admission: RE | Admit: 2018-12-27 | Payer: Medicare Other | Source: Ambulatory Visit

## 2018-12-27 ENCOUNTER — Encounter (HOSPITAL_COMMUNITY): Payer: Self-pay

## 2019-01-10 ENCOUNTER — Ambulatory Visit (INDEPENDENT_AMBULATORY_CARE_PROVIDER_SITE_OTHER): Payer: Medicare Other | Admitting: Vascular Surgery

## 2019-01-10 ENCOUNTER — Encounter (INDEPENDENT_AMBULATORY_CARE_PROVIDER_SITE_OTHER): Payer: Self-pay | Admitting: Vascular Surgery

## 2019-01-10 ENCOUNTER — Other Ambulatory Visit: Payer: Self-pay

## 2019-01-10 VITALS — BP 149/75 | HR 64 | Resp 19 | Ht 61.0 in | Wt 178.0 lb

## 2019-01-10 DIAGNOSIS — I639 Cerebral infarction, unspecified: Secondary | ICD-10-CM | POA: Insufficient documentation

## 2019-01-10 DIAGNOSIS — M79605 Pain in left leg: Secondary | ICD-10-CM | POA: Diagnosis not present

## 2019-01-10 DIAGNOSIS — I739 Peripheral vascular disease, unspecified: Secondary | ICD-10-CM

## 2019-01-10 DIAGNOSIS — E119 Type 2 diabetes mellitus without complications: Secondary | ICD-10-CM | POA: Diagnosis not present

## 2019-01-10 DIAGNOSIS — M79604 Pain in right leg: Secondary | ICD-10-CM

## 2019-01-10 DIAGNOSIS — F32A Depression, unspecified: Secondary | ICD-10-CM | POA: Insufficient documentation

## 2019-01-10 DIAGNOSIS — I10 Essential (primary) hypertension: Secondary | ICD-10-CM

## 2019-01-10 DIAGNOSIS — F329 Major depressive disorder, single episode, unspecified: Secondary | ICD-10-CM | POA: Insufficient documentation

## 2019-01-10 DIAGNOSIS — E785 Hyperlipidemia, unspecified: Secondary | ICD-10-CM | POA: Insufficient documentation

## 2019-01-10 DIAGNOSIS — G20 Parkinson's disease: Secondary | ICD-10-CM

## 2019-01-10 DIAGNOSIS — F410 Panic disorder [episodic paroxysmal anxiety] without agoraphobia: Secondary | ICD-10-CM | POA: Insufficient documentation

## 2019-01-10 DIAGNOSIS — F419 Anxiety disorder, unspecified: Secondary | ICD-10-CM | POA: Insufficient documentation

## 2019-01-10 DIAGNOSIS — E782 Mixed hyperlipidemia: Secondary | ICD-10-CM

## 2019-01-14 ENCOUNTER — Encounter (INDEPENDENT_AMBULATORY_CARE_PROVIDER_SITE_OTHER): Payer: Self-pay | Admitting: Vascular Surgery

## 2019-01-14 DIAGNOSIS — I739 Peripheral vascular disease, unspecified: Secondary | ICD-10-CM | POA: Insufficient documentation

## 2019-01-14 DIAGNOSIS — M79606 Pain in leg, unspecified: Secondary | ICD-10-CM | POA: Insufficient documentation

## 2019-01-14 DIAGNOSIS — E1151 Type 2 diabetes mellitus with diabetic peripheral angiopathy without gangrene: Secondary | ICD-10-CM | POA: Insufficient documentation

## 2019-01-14 NOTE — Progress Notes (Signed)
MRN : [id number]  Adriana Sanders is a 78 y.o. ([birth year]) female who presents with chief complaint of  Chief Complaint  Patient presents with  . Advice Only  .  History of Present Illness:   The patient is seen for evaluation of painful lower extremities. Patient notes the pain is variable and not always associated with activity.  The pain is somewhat consistent day to day occurring on most days. The patient notes the pain also occurs with standing and routinely seems worse as the day wears on. The pain has been progressive over the past several years. The patient states these symptoms are causing  a profound negative impact on quality of life and daily activities.  The patient denies rest pain or dangling of an extremity off the side of the bed during the night for relief. No open wounds or sores at this time. No history of DVT or phlebitis. No prior interventions or surgeries.  There is a  history of back problems and DJD of the lumbar and sacral spine.    Current Meds  Medication Sig  . ALPRAZolam (XANAX) 1 MG tablet Take 1 mg by mouth daily as needed. Anxiety. GIVE ONE HOUR APART FROM OXYCODONE  . atorvastatin (LIPITOR) 10 MG tablet Take 10 mg by mouth daily.  . carbidopa-levodopa (SINEMET IR) 25-100 MG tablet 0.5 tab three times a day for one week, then 1 tab three times a day.  . dipyridamole-aspirin (AGGRENOX) 200-25 MG 12hr capsule TAKE ONE CAPSULE BY MOUTH EVERY MORNING AND EVERY EVENING  . FLUoxetine (PROZAC) 40 MG capsule Take 40 mg by mouth daily.  . fluticasone (FLONASE) 50 MCG/ACT nasal spray Place 2 sprays into both nostrils daily.  [name] lisinopril-hydrochlorothiazide (PRINZIDE,ZESTORETIC) 20-25 MG tablet Take 1 tablet by mouth daily.  . metFORMIN (GLUCOPHAGE) 500 MG tablet Take 500 mg by mouth 2 (two) times daily with a meal.  . methimazole (TAPAZOLE) 5 MG tablet Take 5 mg by mouth daily.  . methimazole (TAPAZOLE) 5 MG tablet TAKE 1 TABLET BY MOUTH EVERY DAY  .  metoprolol tartrate (LOPRESSOR) 100 MG tablet Take 100 mg by mouth 2 (two) times daily with a meal.  . metoprolol tartrate (LOPRESSOR) 100 MG tablet Take by mouth.  . pioglitazone (ACTOS) 15 MG tablet Take 15 mg by mouth daily.  . traMADol (ULTRAM) 50 MG tablet Take 50-100 mg by mouth every 4 (four) hours as needed.  . traZODone (DESYREL) 100 MG tablet TAKE 1 TABLET BY MOUTH EVERYDAY AT BEDTIME  . vitamin B-12 (CYANOCOBALAMIN) 500 MCG tablet Take 500 mcg by mouth daily.    Past Medical History:  Diagnosis Date  . Anxiety   . Depression   . Diabetes mellitus without complication ([place])   . Hyperlipemia   . Hypertension   . Panic disorder   . Stroke [facility]) 2001    Past Surgical History:  Procedure Laterality Date  . ANTERIOR APPROACH HEMI HIP ARTHROPLASTY Left 04/23/2017   Procedure: ANTERIOR APPROACH HEMI HIP ARTHROPLASTY;  Surgeon: Hessie Knows, MD;  Location: [facility] ORS;  Service: Orthopedics;  Laterality: Left;    Social History Social History   Tobacco Use  . Smoking status: Current Every Day Smoker    Packs/day: 1.00    Types: Cigarettes  . Smokeless tobacco: Current User  Substance Use Topics  . Alcohol use: No  . Drug use: No    Family History No family history of bleeding/clotting disorders, porphyria or autoimmune disease   Allergies  Allergen Reactions  .  Hydrocodone-Acetaminophen     Other reaction(s): Unknown  . Vicodin [Hydrocodone-Acetaminophen]      REVIEW OF SYSTEMS (Negative unless checked)  Constitutional: [] Weight loss  [] Fever  [] Chills Cardiac: [] Chest pain   [] Chest pressure   [] Palpitations   [] Shortness of breath when laying flat   [] Shortness of breath with exertion. Vascular:  [x] Pain in legs with walking   [x] Pain in legs at rest  [] History of DVT   [] Phlebitis   [] Swelling in legs   [] Varicose veins   [] Non-healing ulcers Pulmonary:   [] Uses home oxygen   [] Productive cough   [] Hemoptysis   [] Wheeze  [] COPD   [] Asthma Neurologic:   [] Dizziness   [] Seizures   [] History of stroke   [] History of TIA  [] Aphasia   [] Vissual changes   [] Weakness or numbness in arm   [x] Weakness or numbness in leg Musculoskeletal:   [] Joint swelling   [x] Joint pain   [x] Low back pain Hematologic:  [] Easy bruising  [] Easy bleeding   [] Hypercoagulable state   [] Anemic Gastrointestinal:  [] Diarrhea   [] Vomiting  [] Gastroesophageal reflux/heartburn   [] Difficulty swallowing. Genitourinary:  [] Chronic kidney disease   [] Difficult urination  [] Frequent urination   [] Blood in urine Skin:  [] Rashes   [] Ulcers  Psychological:  [] History of anxiety   []  History of major depression.  Physical Examination  Vitals:   01/10/19 1545  BP: (!) 149/75  Pulse: 64  Resp: 19  Weight: 178 lb (80.7 kg)  Height: 5\' 1"  (1.549 m)   Body mass index is 33.63 kg/m. Gen: WD/WN, NAD Head: [place]/AT, No temporalis wasting.  Ear/Nose/Throat: Hearing grossly intact, nares w/o erythema or drainage, poor dentition Eyes: PER, EOMI, sclera nonicteric.  Neck: Supple, no masses.  No bruit or JVD.  Pulmonary:  Good air movement, clear to auscultation bilaterally, no use of accessory muscles.  Cardiac: RRR, normal S1, S2, no Murmurs. Vascular:  Vessel Right Left  Radial Palpable Palpable  Brachial Palpable Palpable  PT Not Palpable Not Palpable  DP Not Palpable Not Palpable  Gastrointestinal: soft, non-distended. No guarding/no peritoneal signs.  Musculoskeletal: M/S 5/5 throughout.  No deformity or atrophy.  Neurologic: CN 2-12 intact. Pain and light touch intact in extremities.  Symmetrical.  Speech is fluent. Motor exam as listed above. Psychiatric: Judgment intact, Mood & affect appropriate for pt's clinical situation. Dermatologic: No rashes or ulcers noted.  No changes consistent with cellulitis. Lymph : No Cervical lymphadenopathy, no lichenification or skin changes of chronic lymphedema.  CBC Lab Results  Component Value Date   WBC 7.8 04/25/2017   HGB 12.0  04/25/2017   HCT 35.4 04/25/2017   MCV 86.8 04/25/2017   PLT 142 (L) 04/25/2017    BMET    Component Value Date/Time   NA 134 (L) 04/25/2017 0407   K 3.9 04/25/2017 0407   CL 104 04/25/2017 0407   CO2 24 04/25/2017 0407   GLUCOSE 161 (H) 04/25/2017 0407   BUN 10 04/25/2017 0407   CREATININE 0.53 04/25/2017 0407   CALCIUM 9.0 04/25/2017 0407   GFRNONAA >60 04/25/2017 0407   GFRAA >60 04/25/2017 0407   CrCl cannot be calculated (Patient's most recent lab result is older than the maximum 21 days allowed.).  COAG Lab Results  Component Value Date   INR 0.95 04/22/2017    Radiology No results found.   Assessment/Plan 1. Pain in both lower extremities  Recommend:  The patient has atypical pain symptoms for pure atherosclerotic disease. However, on physical exam there is evidence of  arterial disease, given the diminished pulses.  Noninvasive studies including ABI's the legs will be obtained and the patient will follow up with me to review these studies.  The patient should continue walking and begin a more formal exercise program. The patient should continue his antiplatelet therapy and aggressive treatment of the lipid abnormalities.  The patient should begin wearing graduated compression socks 15-20 mmHg strength to control edema.  - ABI; Future  2. PAD (peripheral artery disease) (HCC)  Recommend:  The patient has atypical pain symptoms for pure atherosclerotic disease. However, on physical exam there is evidence of arterial disease, given the diminished pulses.  Noninvasive studies including ABI's of the legs will be obtained and the patient will follow up with me to review these studies.  I suspect the patient is c/o leg pain that is in part related to her Parkinson's disease.   The patient should continue walking and begin a more formal exercise program. The patient should continue his antiplatelet therapy and aggressive treatment of the lipid abnormalities.   The patient should begin wearing graduated compression socks 15-20 mmHg strength to control edema.  - ABI; Future  3. Essential hypertension Continue antihypertensive medications as already ordered, these medications have been reviewed and there are no changes at this time.   4. Type 2 diabetes mellitus without complication, without long-term current use of insulin (HCC) Continue hypoglycemic medications as already ordered, these medications have been reviewed and there are no changes at this time.  Hgb A1C to be monitored as already arranged by primary service   5. Parkinson disease ([place]) Continue medications as ordered and reviewed, no changes at this time.  Plan per Neurology   6. Mixed hyperlipidemia Continue statin as ordered and reviewed, no changes at this time     Hortencia Pilar, MD  01/14/2019 11:47 AM

## 2019-01-19 ENCOUNTER — Ambulatory Visit (INDEPENDENT_AMBULATORY_CARE_PROVIDER_SITE_OTHER): Payer: Medicare Other | Admitting: Nurse Practitioner

## 2019-01-19 ENCOUNTER — Encounter (INDEPENDENT_AMBULATORY_CARE_PROVIDER_SITE_OTHER): Payer: Medicare Other

## 2019-02-09 ENCOUNTER — Encounter (INDEPENDENT_AMBULATORY_CARE_PROVIDER_SITE_OTHER): Payer: Medicare Other

## 2019-02-09 ENCOUNTER — Ambulatory Visit (INDEPENDENT_AMBULATORY_CARE_PROVIDER_SITE_OTHER): Payer: Medicare Other | Admitting: Nurse Practitioner

## 2019-03-15 ENCOUNTER — Other Ambulatory Visit: Payer: Self-pay | Admitting: Neurology

## 2019-03-15 DIAGNOSIS — G20 Parkinson's disease: Secondary | ICD-10-CM

## 2019-03-16 ENCOUNTER — Ambulatory Visit (INDEPENDENT_AMBULATORY_CARE_PROVIDER_SITE_OTHER): Payer: Medicare Other | Admitting: Nurse Practitioner

## 2019-03-16 ENCOUNTER — Encounter (INDEPENDENT_AMBULATORY_CARE_PROVIDER_SITE_OTHER): Payer: Medicare Other

## 2019-03-22 ENCOUNTER — Ambulatory Visit: Admission: RE | Admit: 2019-03-22 | Payer: Medicare Other | Source: Ambulatory Visit

## 2019-03-23 ENCOUNTER — Other Ambulatory Visit (HOSPITAL_COMMUNITY): Payer: Self-pay | Admitting: Neurology

## 2019-03-23 DIAGNOSIS — G20 Parkinson's disease: Secondary | ICD-10-CM

## 2019-04-04 ENCOUNTER — Ambulatory Visit (HOSPITAL_COMMUNITY): Admission: RE | Admit: 2019-04-04 | Payer: Medicare Other | Source: Ambulatory Visit

## 2019-05-05 ENCOUNTER — Ambulatory Visit: Payer: Medicare Other | Attending: Internal Medicine

## 2019-05-05 DIAGNOSIS — Z23 Encounter for immunization: Secondary | ICD-10-CM | POA: Insufficient documentation

## 2019-05-05 NOTE — Progress Notes (Signed)
   Covid-19 Vaccination Clinic  Name:  Adriana Sanders    MRN: [id number] DOB: 1940-10-05  05/05/2019  Adriana Sanders was observed post Covid-19 immunization for 15 minutes without incident. She was provided with Vaccine Information Sheet and instruction to access the V-Safe system.   Adriana Sanders was instructed to call 911 with any severe reactions post vaccine: [name] Difficulty breathing  . Swelling of face and throat  . A fast heartbeat  . A bad rash all over body  . Dizziness and weakness   Immunizations Administered    Name Date Dose VIS Date Route   Pfizer COVID-19 Vaccine 05/05/2019 12:09 PM 0.3 mL 02/11/2019 Intramuscular   Manufacturer: [place]   Lot: [id number]   [place]: [id number]

## 2019-05-27 ENCOUNTER — Ambulatory Visit: Payer: Medicare Other | Attending: Internal Medicine

## 2019-05-27 DIAGNOSIS — Z23 Encounter for immunization: Secondary | ICD-10-CM

## 2019-05-27 NOTE — Progress Notes (Signed)
   Covid-19 Vaccination Clinic  Name:  Adriana Sanders    MRN: [id number] DOB: Apr 26, 1940  05/27/2019  Ms. Twist was observed post Covid-19 immunization for 15 minutes without incident. She was provided with Vaccine Information Sheet and instruction to access the V-Safe system.   [name] was instructed to call 911 with any severe reactions post vaccine: [name] Difficulty breathing  . Swelling of face and throat  . A fast heartbeat  . A bad rash all over body  . Dizziness and weakness   Immunizations Administered    Name Date Dose VIS Date Route   Pfizer COVID-19 Vaccine 05/27/2019 11:58 AM 0.3 mL 02/11/2019 Intramuscular   Manufacturer: [place]   Lot: [id number]   [place]: [id number]

## 2019-08-30 ENCOUNTER — Other Ambulatory Visit: Payer: Self-pay | Admitting: Neurology

## 2019-08-30 ENCOUNTER — Other Ambulatory Visit (HOSPITAL_COMMUNITY): Payer: Self-pay | Admitting: Neurology

## 2019-08-30 DIAGNOSIS — Z8673 Personal history of transient ischemic attack (TIA), and cerebral infarction without residual deficits: Secondary | ICD-10-CM

## 2019-08-30 DIAGNOSIS — G20 Parkinson's disease: Secondary | ICD-10-CM

## 2019-09-28 ENCOUNTER — Ambulatory Visit (HOSPITAL_COMMUNITY): Payer: Medicare Other

## 2019-11-09 ENCOUNTER — Other Ambulatory Visit: Payer: Self-pay | Admitting: Orthopedic Surgery

## 2019-11-09 DIAGNOSIS — M170 Bilateral primary osteoarthritis of knee: Secondary | ICD-10-CM

## 2019-11-16 ENCOUNTER — Encounter (HOSPITAL_COMMUNITY): Payer: Self-pay

## 2019-11-16 ENCOUNTER — Ambulatory Visit (HOSPITAL_COMMUNITY): Admission: RE | Admit: 2019-11-16 | Payer: Medicare Other | Source: Ambulatory Visit

## 2019-11-23 ENCOUNTER — Other Ambulatory Visit: Payer: Self-pay

## 2019-11-23 ENCOUNTER — Ambulatory Visit
Admission: RE | Admit: 2019-11-23 | Discharge: 2019-11-23 | Disposition: A | Discharge status: Home / Self Care | Payer: Medicare Other | Source: Ambulatory Visit | Attending: Orthopedic Surgery | Admitting: Orthopedic Surgery

## 2019-11-23 DIAGNOSIS — M170 Bilateral primary osteoarthritis of knee: Secondary | ICD-10-CM | POA: Diagnosis present

## 2020-03-03 DIAGNOSIS — Z87891 Personal history of nicotine dependence: Secondary | ICD-10-CM | POA: Insufficient documentation

## 2020-05-01 ENCOUNTER — Other Ambulatory Visit: Payer: Self-pay | Admitting: Orthopedic Surgery

## 2020-05-11 ENCOUNTER — Encounter
Admission: RE | Admit: 2020-05-11 | Discharge: 2020-05-11 | Disposition: A | Discharge status: Home / Self Care | Payer: Medicare Other | Source: Ambulatory Visit | Attending: Orthopedic Surgery | Admitting: Orthopedic Surgery

## 2020-05-11 ENCOUNTER — Other Ambulatory Visit: Payer: Self-pay

## 2020-05-11 DIAGNOSIS — Z01818 Encounter for other preprocedural examination: Secondary | ICD-10-CM | POA: Diagnosis not present

## 2020-05-11 HISTORY — DX: Unspecified osteoarthritis, unspecified site: M19.90

## 2020-05-11 HISTORY — DX: Parkinson's disease: G20

## 2020-05-11 HISTORY — DX: Malignant (primary) neoplasm, unspecified: C80.1

## 2020-05-11 HISTORY — DX: Myoneural disorder, unspecified: G70.9

## 2020-05-11 HISTORY — DX: Parkinsonism, unspecified: G20.C

## 2020-05-11 HISTORY — DX: Cardiac arrhythmia, unspecified: I49.9

## 2020-05-11 HISTORY — DX: Thyrotoxicosis, unspecified without thyrotoxic crisis or storm: E05.90

## 2020-05-11 LAB — CBC WITH DIFFERENTIAL/PLATELET
Abs Immature Granulocytes: 0.02 K/uL (ref 0.00–0.07)
Basophils Absolute: 0 K/uL (ref 0.0–0.1)
Basophils Relative: 0 %
Eosinophils Absolute: 0 K/uL (ref 0.0–0.5)
Eosinophils Relative: 1 %
HCT: 38.8 % (ref 36.0–46.0)
Hemoglobin: 12.8 g/dL (ref 12.0–15.0)
Immature Granulocytes: 0 %
Lymphocytes Relative: 26 %
Lymphs Abs: 1.3 K/uL (ref 0.7–4.0)
MCH: 28.5 pg (ref 26.0–34.0)
MCHC: 33 g/dL (ref 30.0–36.0)
MCV: 86.4 fL (ref 80.0–100.0)
Monocytes Absolute: 0.4 K/uL (ref 0.1–1.0)
Monocytes Relative: 8 %
Neutro Abs: 3.2 K/uL (ref 1.7–7.7)
Neutrophils Relative %: 65 %
Platelets: 172 K/uL (ref 150–400)
RBC: 4.49 MIL/uL (ref 3.87–5.11)
RDW: 13.5 % (ref 11.5–15.5)
WBC: 4.9 K/uL (ref 4.0–10.5)
nRBC: 0 % (ref 0.0–0.2)

## 2020-05-11 LAB — COMPREHENSIVE METABOLIC PANEL WITH GFR
ALT: 10 U/L (ref 0–44)
AST: 17 U/L (ref 15–41)
Albumin: 4.3 g/dL (ref 3.5–5.0)
Alkaline Phosphatase: 77 U/L (ref 38–126)
Anion gap: 6 (ref 5–15)
BUN: 20 mg/dL (ref 8–23)
CO2: 27 mmol/L (ref 22–32)
Calcium: 9.9 mg/dL (ref 8.9–10.3)
Chloride: 100 mmol/L (ref 98–111)
Creatinine, Ser: 1.09 mg/dL — ABNORMAL HIGH (ref 0.44–1.00)
GFR, Estimated: 52 mL/min — ABNORMAL LOW (ref >60)
Glucose, Bld: 104 mg/dL — ABNORMAL HIGH (ref 70–99)
Potassium: 4 mmol/L (ref 3.5–5.1)
Sodium: 133 mmol/L — ABNORMAL LOW (ref 135–145)
Total Bilirubin: 0.7 mg/dL (ref 0.3–1.2)
Total Protein: 7.4 g/dL (ref 6.5–8.1)

## 2020-05-11 LAB — TYPE AND SCREEN
ABO/RH(D): B NEG
Antibody Screen: NEGATIVE

## 2020-05-11 LAB — SURGICAL PCR SCREEN
MRSA, PCR: NEGATIVE
Staphylococcus aureus: POSITIVE — AB

## 2020-05-11 NOTE — Patient Instructions (Addendum)
INSTRUCTIONS FOR SURGERY     Your surgery is scheduled for:   Tuesday, MARCH 22ND     To find out your arrival time for the day of surgery,          please call [phone] between 1 pm and 3 pm on :  Monday, MARCH 21ST     When you arrive for surgery, report to the [facility].       ONCE FINISHED, GO TO THE SECOND FLOOR AND SIGN IN AT THE SURGERY DESK.    REMEMBER: Instructions that are not followed completely may result in serious medical risk,  up to and including death, or upon the discretion of your surgeon and anesthesiologist,            your surgery may need to be rescheduled.  __X__ 1. Do not eat food after midnight the night before your procedure.                    No gum, candy, lozenger, tic tacs, tums or hard candies.                  ABSOLUTELY NOTHING SOLID IN YOUR MOUTH AFTER MIDNIGHT                    You may drink unlimited clear liquids up to 2 hours before you are scheduled to arrive for surgery.                   Do not drink anything within those 2 hours unless you need to take medicine, then take the                   smallest amount you need.  Clear liquids include:  water, apple juice without pulp,                   any flavor Gatorade, Black coffee, black tea.  Sugar may be added but no dairy/ honey /lemon.                        Broth and jello is not considered a clear liquid.  __x__  2. On the morning of surgery, please brush your teeth with toothpaste and water. You may rinse with                  mouthwash if you wish but DO NOT SWALLOW TOOTHPASTE OR MOUTHWASH  __X___3. NO alcohol for 24 hours before or after surgery.  __x___ 4.  Do NOT smoke or use e-cigarettes for 24 HOURS PRIOR TO SURGERY.                      DO NOT Use any chewable tobacco products for at least 6 hours prior to surgery.  __x___ 5. If you start any new medication after this appointment and prior to  surgery, please                   Bring it with you on the day of surgery.  ___x__ 6. Notify your doctor if  there is any change in your medical condition, such as fever, infection, vomitting,                   Diarrhea or any open sores.  __x___ 7.  USE the CHG SOAP as instructed, the night before surgery and the day of surgery.                   Once you have washed with this soap, do NOT use any of the following: Powders, perfumes                    or lotions. Please do not wear make up, hairpins, clips or nail polish. You MAY wear deodorant.                   Women need to shave 48 hours prior to surgery.                   DO NOT wear ANY jewelry on the day of surgery. If there are rings that are too tight to                    remove easily, please address this prior to the surgery day. Piercings need to be removed.                                                                     NO METAL ON YOUR BODY.                    Do NOT bring any valuables.  If you came to Pre-Admit testing then you will not need license,                     insurance card or credit card.  If you will be staying overnight, please either leave your things in                     the car or have your family be responsible for these items.                     [facility] IS NOT RESPONSIBLE FOR BELONGINGS OR VALUABLES.  ___X__ 8. DO NOT wear contact lenses on surgery day.  You may not have dentures,                     Hearing aides, contacts or glasses in the operating room. These items can be                    Placed in the Recovery Room to receive immediately after surgery.  __x___ 9. IF YOU ARE SCHEDULED TO GO HOME ON THE SAME DAY, YOU MUST                   Have someone to drive you home and to stay with you  for the first 24 hours.                    Have an arrangement prior to arriving on surgery day.  ___x__ 10. Take the following medications on the morning of  surgery with a sip of water:                               1. XANAX, if needed                     2. LIPITOR                     3. PROZAC                     4. METOPROLOL                     5. ALLEGRA                     6. FLONASE  _____ 11.  Follow any instructions provided to you by your surgeon.                        Such as enema, clear liquid bowel prep  __X__  12. STOP AGGRENOX 3 days prior to surgery. Last dose on Friday, 05/17/20.                      Also stop all other aspirin products.                       THIS INCLUDES BC POWDERS / GOODIES POWDER  __x___ 13. STOP Anti-inflammatories as of ONE WEEK PRIOR TO SURGERY.                      This includes IBUPROFEN / MOTRIN / ADVIL / ALEVE/ NAPROXYN                    YOU MAY TAKE TYLENOL ANY TIME PRIOR TO SURGERY.  _x____ 14.  Stop supplements until after surgery. ( One week prior to surgery)                     This includes: VITAMIN B12 // VITAMIN D3  ___x__16.   Stop Metformin 2 full days prior to surgery.  Last dose after dinner on 05/19/20                                        Do NOT take any diabetes medications on surgery day.  ___x___17.  Continue to take the following medications but do not take on the morning of surgery:                        ZESTORETIC  ___X___18. If staying overnight, please have appropriate shoes to wear to be able to walk around the unit.                   Wear clean and comfortable clothing to the hospital.  [place]. IF YOU COMPLETE THE MEDICAL ADVANCE DIRECTIVES, PLEASE BRING A COPY WITH YOU    ON THE DAY OF SURGERY. Bring cell phone numbers for your contacts.

## 2020-05-18 ENCOUNTER — Other Ambulatory Visit
Admission: RE | Admit: 2020-05-18 | Discharge: 2020-05-18 | Disposition: A | Discharge status: Home / Self Care | Payer: Medicare Other | Source: Ambulatory Visit | Attending: Orthopedic Surgery | Admitting: Orthopedic Surgery

## 2020-05-18 ENCOUNTER — Other Ambulatory Visit: Payer: Self-pay

## 2020-05-18 DIAGNOSIS — R350 Frequency of micturition: Secondary | ICD-10-CM | POA: Diagnosis not present

## 2020-05-18 DIAGNOSIS — Z20822 Contact with and (suspected) exposure to covid-19: Secondary | ICD-10-CM | POA: Diagnosis not present

## 2020-05-18 DIAGNOSIS — R8279 Other abnormal findings on microbiological examination of urine: Secondary | ICD-10-CM | POA: Insufficient documentation

## 2020-05-18 DIAGNOSIS — Z01812 Encounter for preprocedural laboratory examination: Secondary | ICD-10-CM | POA: Diagnosis present

## 2020-05-18 LAB — URINALYSIS, COMPLETE (UACMP) WITH MICROSCOPIC
Bilirubin Urine: NEGATIVE
Glucose, UA: NEGATIVE mg/dL
Hgb urine dipstick: NEGATIVE
Ketones, ur: 5 mg/dL — AB
Nitrite: NEGATIVE
Protein, ur: NEGATIVE mg/dL
Specific Gravity, Urine: 1.019 (ref 1.005–1.030)
pH: 5 (ref 5.0–8.0)

## 2020-05-18 LAB — SARS CORONAVIRUS 2 (TAT 6-24 HRS): SARS Coronavirus 2: NEGATIVE

## 2020-05-18 NOTE — Progress Notes (Signed)
  [facility] Perioperative Services: Pre-Admission/Anesthesia Testing  Abnormal Lab Notification   Date: 05/18/20  Name: Adriana Sanders MRN:   [id number]  Re: Abnormal labs noted during PAT appointment   Provider(s) Notified: Hessie Knows, MD Notification mode: Routed and/or faxed via [facility] LAB VALUE(S): Lab Results  Component Value Date   COLORURINE YELLOW (A) 05/18/2020   APPEARANCEUR HAZY (A) 05/18/2020   LABSPEC 1.019 05/18/2020   PHURINE 5.0 05/18/2020   GLUCOSEU NEGATIVE 05/18/2020   HGBUR NEGATIVE 05/18/2020   BILIRUBINUR NEGATIVE 05/18/2020   KETONESUR 5 (A) 05/18/2020   PROTEINUR NEGATIVE 05/18/2020   NITRITE NEGATIVE 05/18/2020   LEUKOCYTESUR MODERATE (A) 05/18/2020   EPIU 0-5 05/18/2020   WBCU 6-10 05/18/2020   RBCU 6-10 05/18/2020   BACTERIA RARE (A) 05/18/2020    Notes:  Patient scheduled for TOTAL KNEE ARTHROPLASTY - Rachelle Hora to Assist (Right Knee) on 05/22/2020.    UA performed in PAT concerning for infection (moderate LE, bacteria, and 6-10 WBC/hpf) . No leukocytosis noted on CBC from 05/11/2020; WBC 4.9 . Renal function normal. Estimated Creatinine Clearance: 41.6 mL/min (A) (by C-G formula based on SCr of 1.09 mg/dL (H)). . Urine C&S added to assess for pathogenically significant growth.  PLANS:  Patient contacted and results reviewed. Suspect early infection given mild LUTS (frequency and urgency). No dysuria, fevers, nausea, vomiting, abdominal/back/suprapubic pain.    Given upcoming surgery, will proceed with empiric treatment for presumed urinary tract infection. With patients age, renal function, and pending surgery I would Ideally like to avoid prolonged ABX course. Will treat with a 3 day course of SMZ-TMP; Rx phoned in to patient's pharmacy. Patient encouraged to complete the entire course of antibiotics even if she begins to feel better. She was advised that if culture demonstrates resistance to the  prescribed antibiotic, she will be contacted and advised of the need to change the antibiotic being used to treat her infection.    Patient encouraged to increase her fluid intake as much as possible. Discussed that water is always best to flush the urinary tract. She was advised to avoid caffeine containing fluids until her infections clears, as caffeine can cause her to experience painful bladder spasms.    May use Tylenol needed for pain/fever.   Will forward note to primary attending surgeon to make him aware of results and treatment plan of care. This is a Community education officer; no formal response is required.  Honor Loh, MSN, APRN, FNP-C, CEN [facility]  Peri-operative Services Nurse Practitioner Phone: [phone] Fax: [phone] 05/18/20 4:32 PM

## 2020-05-20 LAB — URINE CULTURE

## 2020-05-22 ENCOUNTER — Encounter: Payer: Self-pay | Admitting: Orthopedic Surgery

## 2020-05-22 ENCOUNTER — Inpatient Hospital Stay: Payer: Medicare Other

## 2020-05-22 ENCOUNTER — Inpatient Hospital Stay: Payer: Medicare Other | Admitting: Urgent Care

## 2020-05-22 ENCOUNTER — Other Ambulatory Visit: Payer: Self-pay

## 2020-05-22 ENCOUNTER — Encounter
Admission: RE | Disposition: A | Discharge status: Skilled Nursing Facility | Payer: Self-pay | Source: Home / Self Care | Attending: Orthopedic Surgery

## 2020-05-22 ENCOUNTER — Inpatient Hospital Stay
Admission: RE | Admit: 2020-05-22 | Discharge: 2020-05-29 | DRG: 470 | Disposition: A | Discharge status: Skilled Nursing Facility | Payer: Medicare Other | Attending: Orthopedic Surgery | Admitting: Orthopedic Surgery

## 2020-05-22 DIAGNOSIS — G20 Parkinson's disease: Secondary | ICD-10-CM | POA: Diagnosis present

## 2020-05-22 DIAGNOSIS — M1711 Unilateral primary osteoarthritis, right knee: Secondary | ICD-10-CM | POA: Diagnosis present

## 2020-05-22 DIAGNOSIS — Z96651 Presence of right artificial knee joint: Secondary | ICD-10-CM

## 2020-05-22 DIAGNOSIS — Z7984 Long term (current) use of oral hypoglycemic drugs: Secondary | ICD-10-CM | POA: Diagnosis not present

## 2020-05-22 DIAGNOSIS — M810 Age-related osteoporosis without current pathological fracture: Secondary | ICD-10-CM | POA: Diagnosis present

## 2020-05-22 DIAGNOSIS — Z79899 Other long term (current) drug therapy: Secondary | ICD-10-CM | POA: Diagnosis not present

## 2020-05-22 DIAGNOSIS — E785 Hyperlipidemia, unspecified: Secondary | ICD-10-CM | POA: Diagnosis present

## 2020-05-22 DIAGNOSIS — Z8673 Personal history of transient ischemic attack (TIA), and cerebral infarction without residual deficits: Secondary | ICD-10-CM

## 2020-05-22 DIAGNOSIS — E1151 Type 2 diabetes mellitus with diabetic peripheral angiopathy without gangrene: Secondary | ICD-10-CM | POA: Diagnosis present

## 2020-05-22 DIAGNOSIS — F32A Depression, unspecified: Secondary | ICD-10-CM | POA: Diagnosis present

## 2020-05-22 DIAGNOSIS — Z85828 Personal history of other malignant neoplasm of skin: Secondary | ICD-10-CM | POA: Diagnosis not present

## 2020-05-22 DIAGNOSIS — I10 Essential (primary) hypertension: Secondary | ICD-10-CM | POA: Diagnosis present

## 2020-05-22 DIAGNOSIS — R4182 Altered mental status, unspecified: Secondary | ICD-10-CM | POA: Diagnosis present

## 2020-05-22 DIAGNOSIS — Z85850 Personal history of malignant neoplasm of thyroid: Secondary | ICD-10-CM | POA: Diagnosis not present

## 2020-05-22 DIAGNOSIS — Z20822 Contact with and (suspected) exposure to covid-19: Secondary | ICD-10-CM | POA: Diagnosis present

## 2020-05-22 DIAGNOSIS — Z885 Allergy status to narcotic agent status: Secondary | ICD-10-CM | POA: Diagnosis not present

## 2020-05-22 DIAGNOSIS — G8918 Other acute postprocedural pain: Secondary | ICD-10-CM

## 2020-05-22 DIAGNOSIS — N289 Disorder of kidney and ureter, unspecified: Secondary | ICD-10-CM | POA: Diagnosis present

## 2020-05-22 DIAGNOSIS — F419 Anxiety disorder, unspecified: Secondary | ICD-10-CM | POA: Diagnosis present

## 2020-05-22 HISTORY — PX: TOTAL KNEE ARTHROPLASTY: SHX125

## 2020-05-22 LAB — GLUCOSE, CAPILLARY
Glucose-Capillary: 144 mg/dL — ABNORMAL HIGH (ref 70–99)
Glucose-Capillary: 123 mg/dL — ABNORMAL HIGH (ref 70–99)
Glucose-Capillary: 102 mg/dL — ABNORMAL HIGH (ref 70–99)

## 2020-05-22 SURGERY — ARTHROPLASTY, KNEE, TOTAL
Anesthesia: Spinal | Site: Knee | Laterality: Right

## 2020-05-22 MED ORDER — CEFAZOLIN SODIUM-DEXTROSE 2-4 GM/100ML-% IV SOLN
2.0000 g | Freq: Four times a day (QID) | INTRAVENOUS | Status: AC
  Administered 2020-05-22 (×2): 2 g via INTRAVENOUS
  Filled 2020-05-22 (×3): qty 100

## 2020-05-22 MED ORDER — ACETAMINOPHEN 500 MG PO TABS
1000.0000 mg | ORAL_TABLET | Freq: Four times a day (QID) | ORAL | Status: AC
  Administered 2020-05-22 – 2020-05-23 (×3): 1000 mg via ORAL
  Filled 2020-05-22 (×5): qty 2

## 2020-05-22 MED ORDER — LORATADINE 10 MG PO TABS
10.0000 mg | ORAL_TABLET | Freq: Every day | ORAL | Status: DC
  Administered 2020-05-23 – 2020-05-29 (×7): 10 mg via ORAL
  Filled 2020-05-22 (×7): qty 1

## 2020-05-22 MED ORDER — SODIUM CHLORIDE 0.9 % IV SOLN
INTRAVENOUS | Status: DC | PRN
  Administered 2020-05-22: 30 ug/min via INTRAVENOUS

## 2020-05-22 MED ORDER — LISINOPRIL 20 MG PO TABS
20.0000 mg | ORAL_TABLET | Freq: Every day | ORAL | Status: DC
  Administered 2020-05-24 – 2020-05-29 (×6): 20 mg via ORAL
  Filled 2020-05-22 (×6): qty 1

## 2020-05-22 MED ORDER — SODIUM CHLORIDE 0.9 % IV SOLN: INTRAVENOUS | Status: DC

## 2020-05-22 MED ORDER — ONDANSETRON HCL 4 MG PO TABS
4.0000 mg | ORAL_TABLET | Freq: Four times a day (QID) | ORAL | Status: DC | PRN
  Administered 2020-05-22: 4 mg via ORAL
  Filled 2020-05-22: qty 1

## 2020-05-22 MED ORDER — CHLORHEXIDINE GLUCONATE 0.12 % MT SOLN: 15.0000 mL | Freq: Once | OROMUCOSAL | Status: AC

## 2020-05-22 MED ORDER — ONDANSETRON HCL 4 MG/2ML IJ SOLN
INTRAMUSCULAR | Status: AC
  Filled 2020-05-22: qty 2

## 2020-05-22 MED ORDER — PROPOFOL 10 MG/ML IV BOLUS
INTRAVENOUS | Status: AC
  Filled 2020-05-22: qty 20

## 2020-05-22 MED ORDER — LISINOPRIL-HYDROCHLOROTHIAZIDE 20-25 MG PO TABS: 1.0000 | ORAL_TABLET | Freq: Every day | ORAL | Status: DC

## 2020-05-22 MED ORDER — BUPIVACAINE HCL (PF) 0.5 % IJ SOLN
INTRAMUSCULAR | Status: DC | PRN
  Administered 2020-05-22: 2.5 mL via INTRATHECAL

## 2020-05-22 MED ORDER — MENTHOL 3 MG MT LOZG
1.0000 | LOZENGE | OROMUCOSAL | Status: DC | PRN
  Filled 2020-05-22: qty 9

## 2020-05-22 MED ORDER — METOCLOPRAMIDE HCL 10 MG PO TABS
5.0000 mg | ORAL_TABLET | Freq: Three times a day (TID) | ORAL | Status: DC | PRN
  Filled 2020-05-22: qty 1

## 2020-05-22 MED ORDER — CEFAZOLIN SODIUM-DEXTROSE 2-4 GM/100ML-% IV SOLN
INTRAVENOUS | Status: AC
  Filled 2020-05-22: qty 100

## 2020-05-22 MED ORDER — MIDAZOLAM HCL 5 MG/5ML IJ SOLN
INTRAMUSCULAR | Status: DC | PRN
  Administered 2020-05-22: 2 mg via INTRAVENOUS

## 2020-05-22 MED ORDER — FAMOTIDINE 20 MG PO TABS
ORAL_TABLET | ORAL | Status: AC
  Administered 2020-05-22: 20 mg via ORAL
  Filled 2020-05-22: qty 1

## 2020-05-22 MED ORDER — FLUTICASONE PROPIONATE 50 MCG/ACT NA SUSP
2.0000 | Freq: Every day | NASAL | Status: DC
  Administered 2020-05-23 – 2020-05-28 (×6): 2 via NASAL
  Filled 2020-05-22: qty 16

## 2020-05-22 MED ORDER — CEFAZOLIN SODIUM-DEXTROSE 2-4 GM/100ML-% IV SOLN
2.0000 g | INTRAVENOUS | Status: AC
  Administered 2020-05-22: 2 g via INTRAVENOUS

## 2020-05-22 MED ORDER — CARBIDOPA-LEVODOPA 25-100 MG PO TABS
1.0000 | ORAL_TABLET | Freq: Every evening | ORAL | Status: DC
  Administered 2020-05-22 – 2020-05-28 (×7): 1 via ORAL
  Filled 2020-05-22 (×7): qty 1

## 2020-05-22 MED ORDER — BUPIVACAINE HCL (PF) 0.5 % IJ SOLN
INTRAMUSCULAR | Status: AC
  Filled 2020-05-22: qty 10

## 2020-05-22 MED ORDER — PROPOFOL 500 MG/50ML IV EMUL
INTRAVENOUS | Status: AC
  Filled 2020-05-22: qty 50

## 2020-05-22 MED ORDER — ALPRAZOLAM 0.25 MG PO TABS
1.0000 mg | ORAL_TABLET | Freq: Every day | ORAL | Status: DC | PRN
  Administered 2020-05-24: 1 mg via ORAL
  Filled 2020-05-22: qty 2

## 2020-05-22 MED ORDER — PHENOL 1.4 % MT LIQD
1.0000 | OROMUCOSAL | Status: DC | PRN
  Filled 2020-05-22: qty 177

## 2020-05-22 MED ORDER — MAGNESIUM HYDROXIDE 400 MG/5ML PO SUSP
30.0000 mL | Freq: Every day | ORAL | Status: DC | PRN
  Administered 2020-05-24: 30 mL via ORAL
  Filled 2020-05-22: qty 30

## 2020-05-22 MED ORDER — DIPHENHYDRAMINE HCL 12.5 MG/5ML PO ELIX
12.5000 mg | ORAL_SOLUTION | ORAL | Status: DC | PRN
  Filled 2020-05-22: qty 10

## 2020-05-22 MED ORDER — INSULIN ASPART 100 UNIT/ML ~~LOC~~ SOLN
0.0000 [IU] | Freq: Three times a day (TID) | SUBCUTANEOUS | Status: DC
  Administered 2020-05-25 – 2020-05-28 (×4): 2 [IU] via SUBCUTANEOUS
  Administered 2020-05-28: 5 [IU] via SUBCUTANEOUS
  Administered 2020-05-29: 2 [IU] via SUBCUTANEOUS
  Filled 2020-05-22 (×6): qty 1

## 2020-05-22 MED ORDER — FAMOTIDINE 20 MG PO TABS: 20.0000 mg | ORAL_TABLET | Freq: Once | ORAL | Status: AC

## 2020-05-22 MED ORDER — ASPIRIN 81 MG PO CHEW
81.0000 mg | CHEWABLE_TABLET | Freq: Two times a day (BID) | ORAL | Status: DC
  Administered 2020-05-23 – 2020-05-29 (×9): 81 mg via ORAL
  Filled 2020-05-22 (×13): qty 1

## 2020-05-22 MED ORDER — METOCLOPRAMIDE HCL 5 MG/ML IJ SOLN
5.0000 mg | Freq: Three times a day (TID) | INTRAMUSCULAR | Status: DC | PRN
Start: 2020-05-22 — End: 2020-05-29

## 2020-05-22 MED ORDER — METHIMAZOLE 10 MG PO TABS
10.0000 mg | ORAL_TABLET | Freq: Every day | ORAL | Status: DC
  Administered 2020-05-23 – 2020-05-29 (×7): 10 mg via ORAL
  Filled 2020-05-22 (×7): qty 2

## 2020-05-22 MED ORDER — FENTANYL CITRATE (PF) 100 MCG/2ML IJ SOLN: 25.0000 ug | INTRAMUSCULAR | Status: DC | PRN

## 2020-05-22 MED ORDER — ONDANSETRON HCL 4 MG/2ML IJ SOLN: 4.0000 mg | Freq: Four times a day (QID) | INTRAMUSCULAR | Status: DC | PRN

## 2020-05-22 MED ORDER — ONDANSETRON HCL 4 MG/2ML IJ SOLN: 4.0000 mg | Freq: Once | INTRAMUSCULAR | Status: DC | PRN

## 2020-05-22 MED ORDER — NEOMYCIN-POLYMYXIN B GU 40-200000 IR SOLN
Status: DC | PRN
  Administered 2020-05-22: 2 mL

## 2020-05-22 MED ORDER — ALUM & MAG HYDROXIDE-SIMETH 200-200-20 MG/5ML PO SUSP: 30.0000 mL | ORAL | Status: DC | PRN

## 2020-05-22 MED ORDER — MIDAZOLAM HCL 2 MG/2ML IJ SOLN
INTRAMUSCULAR | Status: AC
  Filled 2020-05-22: qty 2

## 2020-05-22 MED ORDER — MAGNESIUM CITRATE PO SOLN
1.0000 | Freq: Once | ORAL | Status: DC | PRN
  Filled 2020-05-22: qty 296

## 2020-05-22 MED ORDER — PIOGLITAZONE HCL 15 MG PO TABS
15.0000 mg | ORAL_TABLET | Freq: Every day | ORAL | Status: DC
  Administered 2020-05-23 – 2020-05-29 (×7): 15 mg via ORAL
  Filled 2020-05-22 (×8): qty 1

## 2020-05-22 MED ORDER — FLUOXETINE HCL 20 MG PO CAPS
40.0000 mg | ORAL_CAPSULE | Freq: Every day | ORAL | Status: DC
  Administered 2020-05-23 – 2020-05-29 (×6): 40 mg via ORAL
  Filled 2020-05-22 (×8): qty 2

## 2020-05-22 MED ORDER — HYDROMORPHONE HCL 1 MG/ML IJ SOLN: 0.5000 mg | INTRAMUSCULAR | Status: DC | PRN

## 2020-05-22 MED ORDER — BUPIVACAINE-EPINEPHRINE (PF) 0.25% -1:200000 IJ SOLN
INTRAMUSCULAR | Status: DC | PRN
  Administered 2020-05-22: 30 mL

## 2020-05-22 MED ORDER — ATORVASTATIN CALCIUM 10 MG PO TABS
10.0000 mg | ORAL_TABLET | Freq: Two times a day (BID) | ORAL | Status: DC
  Administered 2020-05-22 – 2020-05-29 (×14): 10 mg via ORAL
  Filled 2020-05-22 (×14): qty 1

## 2020-05-22 MED ORDER — METHOCARBAMOL 1000 MG/10ML IJ SOLN
500.0000 mg | Freq: Four times a day (QID) | INTRAVENOUS | Status: DC | PRN
  Filled 2020-05-22: qty 5

## 2020-05-22 MED ORDER — DOCUSATE SODIUM 100 MG PO CAPS
100.0000 mg | ORAL_CAPSULE | Freq: Two times a day (BID) | ORAL | Status: DC
  Administered 2020-05-22 – 2020-05-29 (×9): 100 mg via ORAL
  Filled 2020-05-22 (×13): qty 1

## 2020-05-22 MED ORDER — OXYCODONE HCL 5 MG PO TABS
5.0000 mg | ORAL_TABLET | ORAL | Status: DC | PRN
  Administered 2020-05-22: 5 mg via ORAL
  Administered 2020-05-22 – 2020-05-23 (×3): 10 mg via ORAL
  Filled 2020-05-22 (×2): qty 2

## 2020-05-22 MED ORDER — METHOCARBAMOL 500 MG PO TABS
500.0000 mg | ORAL_TABLET | Freq: Four times a day (QID) | ORAL | Status: DC | PRN
  Administered 2020-05-22 – 2020-05-24 (×5): 500 mg via ORAL
  Filled 2020-05-22 (×5): qty 1

## 2020-05-22 MED ORDER — ZOLPIDEM TARTRATE 5 MG PO TABS: 5.0000 mg | ORAL_TABLET | Freq: Every evening | ORAL | Status: DC | PRN

## 2020-05-22 MED ORDER — ACETAMINOPHEN 10 MG/ML IV SOLN
INTRAVENOUS | Status: AC
  Filled 2020-05-22: qty 100

## 2020-05-22 MED ORDER — OXYCODONE HCL 5 MG PO TABS
ORAL_TABLET | ORAL | Status: AC
  Filled 2020-05-22: qty 1

## 2020-05-22 MED ORDER — HYDROCHLOROTHIAZIDE 25 MG PO TABS
25.0000 mg | ORAL_TABLET | Freq: Every day | ORAL | Status: DC
  Administered 2020-05-24 – 2020-05-29 (×6): 25 mg via ORAL
  Filled 2020-05-22 (×6): qty 1

## 2020-05-22 MED ORDER — ACETAMINOPHEN 325 MG PO TABS
325.0000 mg | ORAL_TABLET | Freq: Four times a day (QID) | ORAL | Status: DC | PRN
  Administered 2020-05-23 – 2020-05-29 (×15): 650 mg via ORAL
  Filled 2020-05-22 (×16): qty 2
  Filled 2020-05-22: qty 1

## 2020-05-22 MED ORDER — SODIUM CHLORIDE 0.9 % IV SOLN
INTRAVENOUS | Status: DC | PRN
  Administered 2020-05-22: 60 mL

## 2020-05-22 MED ORDER — BISACODYL 10 MG RE SUPP: 10.0000 mg | Freq: Every day | RECTAL | Status: DC | PRN

## 2020-05-22 MED ORDER — METHIMAZOLE 5 MG PO TABS: 5.0000 mg | ORAL_TABLET | Freq: Two times a day (BID) | ORAL | Status: DC

## 2020-05-22 MED ORDER — PROPOFOL 500 MG/50ML IV EMUL
INTRAVENOUS | Status: DC | PRN
  Administered 2020-05-22: 50 ug/kg/min via INTRAVENOUS

## 2020-05-22 MED ORDER — PANTOPRAZOLE SODIUM 40 MG PO TBEC
40.0000 mg | DELAYED_RELEASE_TABLET | Freq: Every day | ORAL | Status: DC
  Administered 2020-05-23 – 2020-05-29 (×7): 40 mg via ORAL
  Filled 2020-05-22 (×7): qty 1

## 2020-05-22 MED ORDER — METFORMIN HCL 500 MG PO TABS
500.0000 mg | ORAL_TABLET | Freq: Two times a day (BID) | ORAL | Status: DC
  Administered 2020-05-23 – 2020-05-29 (×13): 500 mg via ORAL
  Filled 2020-05-22 (×13): qty 1

## 2020-05-22 MED ORDER — CYANOCOBALAMIN 500 MCG PO TABS
500.0000 ug | ORAL_TABLET | Freq: Every day | ORAL | Status: DC
  Administered 2020-05-23 – 2020-05-29 (×7): 500 ug via ORAL
  Filled 2020-05-22 (×7): qty 1

## 2020-05-22 MED ORDER — ACETAMINOPHEN 10 MG/ML IV SOLN
INTRAVENOUS | Status: DC | PRN
  Administered 2020-05-22: 1000 mg via INTRAVENOUS

## 2020-05-22 MED ORDER — METOPROLOL TARTRATE 25 MG PO TABS
100.0000 mg | ORAL_TABLET | Freq: Two times a day (BID) | ORAL | Status: DC
  Administered 2020-05-22 – 2020-05-29 (×14): 100 mg via ORAL
  Filled 2020-05-22 (×9): qty 2
  Filled 2020-05-22: qty 4
  Filled 2020-05-22 (×4): qty 2

## 2020-05-22 MED ORDER — MORPHINE SULFATE (PF) 10 MG/ML IV SOLN
INTRAVENOUS | Status: DC | PRN
  Administered 2020-05-22: 10 mg

## 2020-05-22 MED ORDER — OXYCODONE HCL 5 MG PO TABS
10.0000 mg | ORAL_TABLET | ORAL | Status: DC | PRN
  Administered 2020-05-22 – 2020-05-24 (×2): 10 mg via ORAL
  Filled 2020-05-22 (×2): qty 2
  Filled 2020-05-22: qty 3

## 2020-05-22 MED ORDER — ONDANSETRON HCL 4 MG/2ML IJ SOLN
INTRAMUSCULAR | Status: DC | PRN
Start: 2020-05-22 — End: 2020-05-22
  Administered 2020-05-22: 4 mg via INTRAVENOUS

## 2020-05-22 MED ORDER — ORAL CARE MOUTH RINSE: 15.0000 mL | Freq: Once | OROMUCOSAL | Status: AC

## 2020-05-22 MED ORDER — GLYCOPYRROLATE 0.2 MG/ML IJ SOLN
INTRAMUSCULAR | Status: DC | PRN
  Administered 2020-05-22: .2 mg via INTRAVENOUS

## 2020-05-22 MED ORDER — TRAMADOL HCL 50 MG PO TABS
50.0000 mg | ORAL_TABLET | Freq: Four times a day (QID) | ORAL | Status: DC
Start: 2020-05-22 — End: 2020-05-24
  Administered 2020-05-22 – 2020-05-24 (×7): 50 mg via ORAL
  Filled 2020-05-22 (×7): qty 1

## 2020-05-22 MED ORDER — ASPIRIN-DIPYRIDAMOLE ER 25-200 MG PO CP12
1.0000 | ORAL_CAPSULE | Freq: Two times a day (BID) | ORAL | Status: DC
  Administered 2020-05-22 – 2020-05-29 (×14): 1 via ORAL
  Filled 2020-05-22 (×14): qty 1

## 2020-05-22 MED ORDER — TRAZODONE HCL 50 MG PO TABS
100.0000 mg | ORAL_TABLET | Freq: Every day | ORAL | Status: DC
  Administered 2020-05-22 – 2020-05-28 (×7): 100 mg via ORAL
  Filled 2020-05-22 (×7): qty 1

## 2020-05-22 MED ORDER — CHLORHEXIDINE GLUCONATE 0.12 % MT SOLN
OROMUCOSAL | Status: AC
  Administered 2020-05-22: 15 mL via OROMUCOSAL
  Filled 2020-05-22: qty 15

## 2020-05-22 SURGICAL SUPPLY — 73 items
BLADE SAGITTAL 25.0X1.19X90 (BLADE) ×2 IMPLANT
BLADE SAW 90X13X1.19 OSCILLAT (BLADE) ×2 IMPLANT
BLOCK CUTTING FEMUR 3 RT MED (MISCELLANEOUS) ×2 IMPLANT
BLOCK CUTTING TIBIAL 3 RT (MISCELLANEOUS) ×2 IMPLANT
BLOCK CUTTING TIBIAL 4 RT MIS (MISCELLANEOUS) ×2 IMPLANT
BNDG ELASTIC 6X5.8 VLCR STR LF (GAUZE/BANDAGES/DRESSINGS) ×2 IMPLANT
CANISTER SUCT 1200ML W/VALVE (MISCELLANEOUS) ×2 IMPLANT
CANISTER SUCT 3000ML PPV (MISCELLANEOUS) ×4 IMPLANT
CANISTER WOUND CARE 500ML ATS (WOUND CARE) ×2 IMPLANT
CEMENT HV SMART SET (Cement) ×4 IMPLANT
CEMENT PATELLA RESURF SZ1 (Cement) ×2 IMPLANT
CHLORAPREP W/TINT 26 (MISCELLANEOUS) ×4 IMPLANT
COOLER POLAR GLACIER W/PUMP (MISCELLANEOUS) ×2 IMPLANT
COVER WAND RF STERILE (DRAPES) ×2 IMPLANT
CUFF TOURN SGL QUICK 24 (TOURNIQUET CUFF) ×1
CUFF TOURN SGL QUICK 30 (TOURNIQUET CUFF)
CUFF TRNQT CYL 24X4X16.5-23 (TOURNIQUET CUFF) ×1 IMPLANT
CUFF TRNQT CYL 30X4X21-28X (TOURNIQUET CUFF) IMPLANT
DRAPE 3/4 80X56 (DRAPES) ×4 IMPLANT
DRSG MEPILEX SACRM 8.7X9.8 (GAUZE/BANDAGES/DRESSINGS) ×2 IMPLANT
ELECT CAUTERY BLADE 6.4 (BLADE) ×2 IMPLANT
ELECT REM PT RETURN 9FT ADLT (ELECTROSURGICAL) ×2
ELECTRODE REM PT RTRN 9FT ADLT (ELECTROSURGICAL) ×1 IMPLANT
FEMORAL COMP SZ3P RT SPHERE (Femur) ×2 IMPLANT
FEMUR BONE MODEL (MISCELLANEOUS) ×2 IMPLANT
GAUZE SPONGE 4X4 12PLY STRL (GAUZE/BANDAGES/DRESSINGS) ×2 IMPLANT
GAUZE XEROFORM 1X8 LF (GAUZE/BANDAGES/DRESSINGS) ×2 IMPLANT
GLOVE SURG ORTHO LTX SZ8 (GLOVE) ×2 IMPLANT
GLOVE SURG SYN 9.0  PF PI (GLOVE) ×1
GLOVE SURG SYN 9.0 PF PI (GLOVE) ×1 IMPLANT
GLOVE SURG UNDER LTX SZ8 (GLOVE) ×2 IMPLANT
GLOVE SURG UNDER POLY LF SZ9 (GLOVE) ×2 IMPLANT
GOWN SRG 2XL LVL 4 RGLN SLV (GOWNS) ×1 IMPLANT
GOWN STRL NON-REIN 2XL LVL4 (GOWNS) ×1
GOWN STRL REUS W/ TWL LRG LVL3 (GOWN DISPOSABLE) ×1 IMPLANT
GOWN STRL REUS W/ TWL XL LVL3 (GOWN DISPOSABLE) ×1 IMPLANT
GOWN STRL REUS W/TWL LRG LVL3 (GOWN DISPOSABLE) ×1
GOWN STRL REUS W/TWL XL LVL3 (GOWN DISPOSABLE) ×1
HOLDER FOLEY CATH W/STRAP (MISCELLANEOUS) ×2 IMPLANT
HOOD PEEL AWAY FLYTE STAYCOOL (MISCELLANEOUS) ×4 IMPLANT
INSERT TIBIAL SZ 3 HT 10 R (Insert) ×2 IMPLANT
IRRIGATION SURGIPHOR STRL (IV SOLUTION) ×2 IMPLANT
KIT PREVENA INCISION MGT20CM45 (CANNISTER) ×2 IMPLANT
KIT TURNOVER KIT A (KITS) ×2 IMPLANT
MANIFOLD NEPTUNE II (INSTRUMENTS) ×2 IMPLANT
NDL SAFETY ECLIPSE 18X1.5 (NEEDLE) ×1 IMPLANT
NEEDLE HYPO 18GX1.5 SHARP (NEEDLE) ×1
NEEDLE SPNL 18GX3.5 QUINCKE PK (NEEDLE) ×2 IMPLANT
NEEDLE SPNL 20GX3.5 QUINCKE YW (NEEDLE) ×2 IMPLANT
NS IRRIG 1000ML POUR BTL (IV SOLUTION) ×2 IMPLANT
PACK TOTAL KNEE (MISCELLANEOUS) ×2 IMPLANT
PAD WRAPON POLAR KNEE (MISCELLANEOUS) ×1 IMPLANT
PENCIL SMOKE EVACUATOR COATED (MISCELLANEOUS) ×2 IMPLANT
PULSAVAC PLUS IRRIG FAN TIP (DISPOSABLE) ×2
SCALPEL PROTECTED #10 DISP (BLADE) ×4 IMPLANT
SOL .9 NS 3000ML IRR  AL (IV SOLUTION) ×1
SOL .9 NS 3000ML IRR UROMATIC (IV SOLUTION) ×1 IMPLANT
STAPLER SKIN PROX 35W (STAPLE) ×2 IMPLANT
STEM EXTENSION 11MMX30MM (Stem) ×2 IMPLANT
SUCTION FRAZIER HANDLE 10FR (MISCELLANEOUS) ×1
SUCTION TUBE FRAZIER 10FR DISP (MISCELLANEOUS) ×1 IMPLANT
SUT DVC 2 QUILL PDO  T11 36X36 (SUTURE) ×1
SUT DVC 2 QUILL PDO T11 36X36 (SUTURE) ×1 IMPLANT
SUT ETHIBOND 2 V 37 (SUTURE) IMPLANT
SUT V-LOC 90 ABS DVC 3-0 CL (SUTURE) ×2 IMPLANT
SYR 20ML LL LF (SYRINGE) ×2 IMPLANT
SYR 50ML LL SCALE MARK (SYRINGE) ×4 IMPLANT
TIP FAN IRRIG PULSAVAC PLUS (DISPOSABLE) ×1 IMPLANT
TOWEL OR 17X26 4PK STRL BLUE (TOWEL DISPOSABLE) ×2 IMPLANT
TOWER CARTRIDGE SMART MIX (DISPOSABLE) ×2 IMPLANT
TRAY FOLEY MTR SLVR 16FR STAT (SET/KITS/TRAYS/PACK) ×2 IMPLANT
TRAY TIB FX RT SZ 3 (Joint) ×2 IMPLANT
WRAPON POLAR PAD KNEE (MISCELLANEOUS) ×2

## 2020-05-22 NOTE — Anesthesia Preprocedure Evaluation (Signed)
Anesthesia Evaluation  Patient identified by MRN, date of birth, ID band Patient awake    Reviewed: Allergy & Precautions, NPO status , Patient's Chart, lab work & pertinent test results  History of Anesthesia Complications Negative for: history of anesthetic complications  Airway Mallampati: II  TM Distance: >3 FB Neck ROM: Full    Dental  (+) Partial Lower, Partial Upper   Pulmonary neg sleep apnea, neg COPD, former smoker,    breath sounds clear to auscultation- rhonchi (-) wheezing      Cardiovascular hypertension, Pt. on medications (-) CAD, (-) Past MI, (-) Cardiac Stents and (-) CABG  Rhythm:Regular Rate:Normal - Systolic murmurs and - Diastolic murmurs    Neuro/Psych PSYCHIATRIC DISORDERS Anxiety Depression Parkinson's disease  CVA, No Residual Symptoms    GI/Hepatic negative GI ROS, Neg liver ROS,   Endo/Other  diabetes, Oral Hypoglycemic AgentsHyperthyroidism   Renal/GU Renal InsufficiencyRenal disease     Musculoskeletal  (+) Arthritis ,   Abdominal (+) - obese,   Peds  Hematology negative hematology ROS (+)   Anesthesia Other Findings Past Medical History: No date: Anxiety No date: Arthritis     Comment:  both knees No date: Cancer ([place])     Comment:  basal cell of skin, thyroid cancer.  had radiation No date: Depression No date: Diabetes mellitus without complication (HCC) No date: Dysrhythmia     Comment:  skips a beat No date: Hyperlipemia No date: Hypertension No date: Hyperthyroidism No date: Neuromuscular disorder ([place]) No date: Panic disorder No date: Parkinsonism ([place]) 2001: Stroke ([employer])   Reproductive/Obstetrics                             Lab Results  Component Value Date   WBC 4.9 05/11/2020   HGB 12.8 05/11/2020   HCT 38.8 05/11/2020   MCV 86.4 05/11/2020   PLT 172 05/11/2020    Anesthesia Physical Anesthesia Plan  ASA: III  Anesthesia Plan:  Spinal   Post-op Pain Management:    Induction:   PONV Risk Score and Plan: 2 and Propofol infusion  Airway Management Planned: Natural Airway  Additional Equipment:   Intra-op Plan:   Post-operative Plan:   Informed Consent: I have reviewed the patients History and Physical, chart, labs and discussed the procedure including the risks, benefits and alternatives for the proposed anesthesia with the patient or authorized representative who has indicated his/her understanding and acceptance.     Dental advisory given  Plan Discussed with: CRNA and Anesthesiologist  Anesthesia Plan Comments:         Anesthesia Quick Evaluation

## 2020-05-22 NOTE — Transfer of Care (Signed)
Immediate Anesthesia Transfer of Care Note  Patient: Adriana Sanders  Procedure(s) Performed: TOTAL KNEE ARTHROPLASTY - Rachelle Hora to Assist (Right Knee)  Patient Location: PACU  Anesthesia Type:Spinal  Level of Consciousness: awake, alert  and oriented  Airway & Oxygen Therapy: Patient Spontanous Breathing and Patient connected to face mask oxygen  Post-op Assessment: Report given to RN and Post -op Vital signs reviewed and stable  Post vital signs: Reviewed  Last Vitals:  Vitals Value Taken Time  BP    Temp    Pulse    Resp 14 05/22/20 1213  SpO2    Vitals shown include unvalidated device data.  Last Pain:  Vitals:   05/22/20 0826  TempSrc: Temporal  PainSc: 0-No pain         Complications: No complications documented.

## 2020-05-22 NOTE — Plan of Care (Signed)

## 2020-05-22 NOTE — Anesthesia Procedure Notes (Signed)
Date/Time: 05/22/2020 10:26 AM Performed by: Martie Round, RN Pre-anesthesia Checklist: Patient identified, Emergency Drugs available, Patient being monitored, Suction available and Timeout performed Oxygen Delivery Method: Simple face mask

## 2020-05-22 NOTE — Op Note (Signed)
05/22/2020  12:11 PM  PATIENT:  Adriana Sanders   MRN: [id number]  PRE-OPERATIVE DIAGNOSIS:  Primary localized osteoarthritis of right knee   POST-OPERATIVE DIAGNOSIS:  Same   PROCEDURE:  Procedure(s): Right TOTAL KNEE ARTHROPLASTY   SURGEON: Laurene Footman, MD   ASSISTANTS: Rachelle Hora, PA-C   ANESTHESIA:   spinal   EBL:   100   BLOOD ADMINISTERED:none   DRAINS: Incisional wound VAC    LOCAL MEDICATIONS USED:  MARCAINE    and OTHER morphine and Exparel   SPECIMEN:  No Specimen   DISPOSITION OF SPECIMEN:  N/A   COUNTS:  YES   TOURNIQUET:   25 minutes at 300 mm Hg   IMPLANTS: Medacta  GMK sphere system with  3+ right femur, 3 right tibia with short stem and  10 mm insert.  Size  1 patella, all components cemented.   DICTATION: Viviann Spare Dictation   patient was brought to the operating room and spinal anesthesia was obtained.  After prepping and draping the  right leg in sterile fashion, and after patient identification and timeout procedures were completed, tourniquet was raised  and midline skin incision was made followed by medial parapatellar arthrotomy with  eburnation of the medial compartment osteoarthritis, severe patellofemoral arthritis and  moderate lateral compartment arthritis, partial synovectomy was also carried out.   The ACL and PCL and fat pad were excised along with anterior horns of the meniscus. The proximal tibia cutting guide from  the [facility] system was applied and the proximal tibia cut carried out.  The distal femoral cut was carried out in a similar fashion     The  3+ femoral cutting guide applied with anterior posterior and chamfer cuts made.  The posterior horns of the menisci were removed at this point.   Injection of the above medication was carried out after the femoral and tibial cuts were carried out.  The  3 baseplate trial was placed pinned into position and proximal tibial preparation carried out with drilling hand reaming and the keel punch  followed by placement of the  3+ femur and sizing the tibial insert size   10 millimeter gave the best fit with stability and full extension.  The distal femoral drill holes were made in the notch cut for the trochlear groove was then carried out with trials were then removed the patella was cut using the patellar cutting guide and it sized to a size  1 after drill holes have been made  The knee was irrigated with pulsatile lavage and the bony surfaces dried the tibial component was cemented into place first.  Excess cement was removed and the polyethylene insert placed with a torque screw placed with a torque screwdriver tightened.  The distal femoral component was placed and the knee was held in extension as the patellar button was clamped into place.  After the cement was set, excess cement was removed and the knee was again irrigated thoroughly thoroughly irrigated.  The tourniquet was let down and hemostasis checked with electrocautery. The arthrotomy was repaired with a heavy Quill suture,  followed by 3-0 V lock subcuticular closure, skin staples followed by incisional wound VAC and Polar Care.[name]   PLAN OF CARE: Admit to inpatient    PATIENT DISPOSITION:  PACU - hemodynamically stable.

## 2020-05-22 NOTE — Evaluation (Signed)
Physical Therapy Evaluation Patient Details Name: Adriana Sanders MRN: [id number] DOB: [birth year] Today's Date: 05/22/2020   History of Present Illness  80 y/o female s/p R TKA 05/22/20.  Clinical Impression  Pt with some expected pain hesitancy with most aspects of PT exam/session, but she ultimately did better than she expected and was able to show good effort with exercises, mobility, gait and has great POD0 ROM.  Overall she did well.      Follow Up Recommendations SNF (per progress)    Equipment Recommendations  None recommended by PT    Recommendations for Other Services       Precautions / Restrictions Precautions Precautions: Fall Restrictions Weight Bearing Restrictions: Yes RLE Weight Bearing: Weight bearing as tolerated      Mobility  Bed Mobility Overal bed mobility: Needs Assistance Bed Mobility: Supine to Sit     Supine to sit: Min assist     General bed mobility comments: only very light assist to get to EOB sitting    Transfers Overall transfer level: Needs assistance Equipment used: Rolling walker (2 wheeled) Transfers: Sit to/from Stand Sit to Stand: Min assist         General transfer comment: light assist to get scooted to EOB, cuing for set up and sequencing with only CGA for rise to and sit from stand  Ambulation/Gait Ambulation/Gait assistance: Min guard Gait Distance (Feet): 45 Feet Assistive device: Rolling walker (2 wheeled)       General Gait Details: Pt initially with very small, hesitant, choppy steps with lateral lean to WBing side with each step.  Pt clearly having some increased pain with WBing but did improve tolerance, cadence, step length and speed.  Pt fatigue with the effort, though HR stayed in the 60s and O2 (on room air) in the mid/high 90s.  Stairs            Wheelchair Mobility    Modified Rankin (Stroke Patients Only)       Balance Overall balance assessment: Needs assistance   Sitting balance-Leahy  Scale: Good     Standing balance support: Bilateral upper extremity supported Standing balance-Leahy Scale: Fair                               Pertinent Vitals/Pain Pain Assessment: 0-10 Pain Score: 8  Pain Location: R knee    Home Living Family/patient expects to be discharged to:: Private residence Living Arrangements: Children Available Help at Discharge: Family;Available PRN/intermittently (daughter gone (work) 12 hr/day)   Home Access: Stairs to enter Entrance Stairs-Rails: None Technical brewer of Steps: 2 Home Layout: Two level;1/2 bath on main level Home Equipment: Walker - 2 wheels;Bedside commode      Prior Function Level of Independence: Independent with assistive device(s)         Comments: Pt has recently been limping around with cane , per daughter "she probably should have been using the walker."  She does not drive but is able to get out in the community when the opportunity arises     Hand Dominance        Extremity/Trunk Assessment   Upper Extremity Assessment Upper Extremity Assessment: Generalized weakness;Overall WFL for tasks assessed    Lower Extremity Assessment Lower Extremity Assessment: Overall WFL for tasks assessed (expected post-op weakness, but able to do SLRs w/o warm up and showed good relative quality of motion)       Communication   Communication: No  difficulties  Cognition Arousal/Alertness: Awake/alert Behavior During Therapy: WFL for tasks assessed/performed Overall Cognitive Status: Within Functional Limits for tasks assessed                                        General Comments      Exercises Total Joint Exercises Ankle Circles/Pumps: AROM;10 reps Quad Sets: Strengthening;10 reps Short Arc Quad: AROM;Strengthening;10 reps Heel Slides: AROM;AAROM;5 reps Hip ABduction/ADduction: AROM;10 reps Straight Leg Raises: AROM;10 reps Knee Flexion: PROM;5 reps Goniometric ROM: 0-92    Assessment/Plan    PT Assessment Patient needs continued PT services  PT Problem List Decreased strength;Decreased range of motion;Decreased activity tolerance;Decreased balance;Decreased mobility;Decreased knowledge of use of DME;Decreased safety awareness;Cardiopulmonary status limiting activity;Pain;Decreased knowledge of precautions       PT Treatment Interventions DME instruction;Gait training;Stair training;Functional mobility training;Therapeutic activities;Therapeutic exercise;Balance training;Neuromuscular re-education;Patient/family education    PT Goals (Current goals can be found in the Care Plan section)  Acute Rehab PT Goals Patient Stated Goal: get back to walking w/o AD PT Goal Formulation: With patient Time For Goal Achievement: 06/05/20 Potential to Achieve Goals: Good    Frequency BID   Barriers to discharge        Co-evaluation               AM-PAC PT "6 Clicks" Mobility  Outcome Measure Help needed turning from your back to your side while in a flat bed without using bedrails?: A Little Help needed moving from lying on your back to sitting on the side of a flat bed without using bedrails?: A Little Help needed moving to and from a bed to a chair (including a wheelchair)?: A Little Help needed standing up from a chair using your arms (e.g., wheelchair or bedside chair)?: A Little Help needed to walk in hospital room?: A Little Help needed climbing 3-5 steps with a railing? : A Lot 6 Click Score: 17    End of Session Equipment Utilized During Treatment: Gait belt Activity Tolerance: Patient limited by pain;Patient tolerated treatment well Patient left: with chair alarm set;with call bell/phone within reach;with nursing/sitter in room;with family/visitor present Nurse Communication: Mobility status PT Visit Diagnosis: Muscle weakness (generalized) (M62.81);Difficulty in walking, not elsewhere classified (R26.2);Pain Pain - Right/Left: Right Pain -  part of body: Knee    Time: 1616-1700 PT Time Calculation (min) (ACUTE ONLY): 44 min   Charges:   PT Evaluation $PT Eval Low Complexity: 1 Low PT Treatments $Gait Training: 8-22 mins $Therapeutic Exercise: 8-22 mins        Kreg Shropshire, DPT 05/22/2020, 5:50 PM

## 2020-05-22 NOTE — H&P (Signed)
Adriana Sanders, [place] - 05/14/2020 2:45 PM EDT Formatting of this note is different from the original.  Chief Complaint  Patient presents with  . Pre-op Exam  TOTAL KNEE ARTHROPLASTY right knee scheduled 05/22/20   History of the Present Illness: Adriana Sanders is a 80 y.o. female here for history and physical for right total knee arthroplasty with Dr. Hessie Knows on 05/22/2020. Patient has had progressive right knee pain for 2 to 3 years. X-ray shows severe varus deformity with complete loss of joint space in the medial compartment with tricompartmental arthritic changes throughout the right knee. PCP gave her cortisone injections intra-articularly into the right knee with no improvement. Pain is interfering with quality of life and activities daily living. She has been walking with a cane and taken Tylenol. She has some instability to the right knee. Patient has seen Dr. Rudene Christians discussed total knee arthroplasty and agreed and consented the procedure.  She has a therapist that comes twice a week for Parkinson disease. She has no cardiac or pulmonary issues, and she has never had a blood clot. Her last A1c was 6.5 in 08/2019.  I have reviewed past medical, surgical, social and family history, and allergies as documented in the EMR.  Past Medical History: Past Medical History:  Diagnosis Date  . Anxiety  . Depression  . Diabetes mellitus type 2, uncomplicated (CMS-HCC)  . Hyperlipidemia  . Hypertension  . Panic disorder  . Stroke (CMS-HCC) 2001   Past Surgical History: Past Surgical History:  Procedure Laterality Date  . Left Hip Hemiarthroplasty Left 04/23/2017  Hessie Knows, MD   Past Family History: Family History  Problem Relation Age of Onset  . No Known Problems Mother   Medications: Current Outpatient Medications Ordered in Epic  Medication Sig Dispense Refill  . ALPRAZolam (XANAX) 1 MG tablet Take 1 tablet (1 mg total) by mouth once daily as needed 90 tablet 0   . ARIPiprazole (ABILIFY) 2 MG tablet TAKE 1 TABLET (2 MG TOTAL) BY MOUTH ONCE DAILY FOR 30 DAYS 90 tablet 0  . aspirin-dipyridamole (AGGRENOX) 25-200 mg ER capsule TAKE ONE CAPSULE BY MOUTH EVERY MORNING AND EVERY EVENING 180 capsule 3  . atorvastatin (LIPITOR) 10 MG tablet TAKE 1 TABLET BY MOUTH EVERY DAY 90 tablet 3  . carbidopa-levodopa (SINEMET CR) 50-200 mg CR tablet TAKE 1 TABLET BY MOUTH EVERY DAY AT NIGHT 90 tablet 3  . carbidopa-levodopa (SINEMET) 25-100 mg tablet TAKE 1 TABLET BY MOUTH 3 (THREE) TIMES DAILY FOR 90 DAYS 270 tablet 3  . diclofenac (VOLTAREN) 1 % topical gel APPLY 4 G TOPICALLY 4 (FOUR) TIMES DAILY 300 g 11  . FLUoxetine (PROZAC) 40 MG capsule TAKE 1 CAPSULE BY MOUTH EVERY DAY 90 capsule 3  . lisinopriL-hydrochlorothiazide (ZESTORETIC) 20-25 mg tablet TAKE 1 TABLET BY MOUTH EVERY DAY 90 tablet 3  . metFORMIN (GLUCOPHAGE) 500 MG tablet TAKE 1 TABLET BY MOUTH TWICE A DAY 180 tablet 3  . methIMAzole (TAPAZOLE) 10 MG tablet Take 1 tablet (10 mg total) by mouth once daily 30 tablet 5  . methIMAzole (TAPAZOLE) 5 MG tablet TAKE 1 TABLET BY MOUTH EVERY DAY 90 tablet 3  . metoprolol tartrate (LOPRESSOR) 100 MG tablet TAKE 1 TABLET BY MOUTH TWICE A DAY WITH FOOD 180 tablet 3  . pioglitazone (ACTOS) 15 MG tablet TAKE 1 TABLET BY MOUTH EVERY DAY 30 tablet 5  . traZODone (DESYREL) 100 MG tablet TAKE 1 TABLET BY MOUTH EVERYDAY AT BEDTIME 90 tablet 3  No current Epic-ordered facility-administered medications on file.   Allergies: Allergies  Allergen Reactions  . Vicodin [Hydrocodone-Acetaminophen] Unknown    Body mass index is 30.73 kg/m.  Review of Systems: A comprehensive 14 point ROS was performed, reviewed, and the pertinent orthopaedic findings are documented in the HPI.  Vitals:  05/14/20 1435  BP: 118/62   General Physical Examination:  General:  Well developed, well nourished, no apparent distress, normal affect, presents in a wheelchair. Has a cane in  hand.  HEENT: Head normocephalic, atraumatic, PERRL.   Abdomen: Soft, non tender, non distended, Bowel sounds present.  Heart: Examination of the heart reveals regular, rate, and rhythm. There is no murmur noted on ascultation. There is a normal apical pulse.  Lungs: Lungs are clear to auscultation. There is no wheeze, rhonchi, or crackles. There is normal expansion of bilateral chest walls.   Musculoskeletal Examination: On exam, good bilateral knee range of motion. 0 to 115 degrees. Small effusion. Varus deformities bilaterally, passively correctable on the right, not on the left. Palpable dorsalis pedis and posterior tibial pulses bilaterally. Short gait with a cane.   Radiographs: AP, lateral, standing, and sunrise, x-rays of the right knee were reviewed by me today from 11/09/2019. AP standing shows complete loss of medial joint space with Sanders lateral subluxation, extensive osteophytes on the lateral side as well. Lateral view shows posterior osteophytes of the femur and tibia. Sunrise view shows severe patellofemoral degenerative change with some bone erosion of the patella.  X-ray Impression Severe tricompartmental osteoarthritis, right knee.  Assessment: ICD-10-CM  1. Primary osteoarthritis of right knee M17.11   Plan: 1. Risks, benefits, complications of a right total knee arthroplasty have been discussed with the patient. Patient has agreed exam procedure with Dr. Hessie Knows on 05/22/2020.    Electronically signed by Adriana Gottron, PA at 05/14/2020 2:57 PM EDT    Reviewed  H+P. No changes noted.

## 2020-05-23 ENCOUNTER — Encounter: Payer: Self-pay | Admitting: Orthopedic Surgery

## 2020-05-23 LAB — GLUCOSE, CAPILLARY
Glucose-Capillary: 169 mg/dL — ABNORMAL HIGH (ref 70–99)
Glucose-Capillary: 103 mg/dL — ABNORMAL HIGH (ref 70–99)
Glucose-Capillary: 126 mg/dL — ABNORMAL HIGH (ref 70–99)
Glucose-Capillary: 97 mg/dL (ref 70–99)
Glucose-Capillary: 107 mg/dL — ABNORMAL HIGH (ref 70–99)

## 2020-05-23 LAB — BASIC METABOLIC PANEL
Anion gap: 7 (ref 5–15)
BUN: 20 mg/dL (ref 8–23)
CO2: 22 mmol/L (ref 22–32)
Calcium: 8.8 mg/dL — ABNORMAL LOW (ref 8.9–10.3)
Chloride: 99 mmol/L (ref 98–111)
Creatinine, Ser: 1.4 mg/dL — ABNORMAL HIGH (ref 0.44–1.00)
GFR, Estimated: 39 mL/min — ABNORMAL LOW (ref >60)
Glucose, Bld: 137 mg/dL — ABNORMAL HIGH (ref 70–99)
Potassium: 3.8 mmol/L (ref 3.5–5.1)
Sodium: 128 mmol/L — ABNORMAL LOW (ref 135–145)

## 2020-05-23 LAB — CBC
HCT: 30.8 % — ABNORMAL LOW (ref 36.0–46.0)
Hemoglobin: 10.5 g/dL — ABNORMAL LOW (ref 12.0–15.0)
MCH: 29.6 pg (ref 26.0–34.0)
MCHC: 34.1 g/dL (ref 30.0–36.0)
MCV: 86.8 fL (ref 80.0–100.0)
Platelets: 175 10*3/uL (ref 150–400)
RBC: 3.55 MIL/uL — ABNORMAL LOW (ref 3.87–5.11)
RDW: 14.1 % (ref 11.5–15.5)
WBC: 7.2 10*3/uL (ref 4.0–10.5)
nRBC: 0 % (ref 0.0–0.2)

## 2020-05-23 NOTE — Evaluation (Addendum)
Occupational Therapy Evaluation Patient Details Name: Adriana Sanders MRN: [id number] DOB: [birth year] Today's Date: 05/23/2020    History of Present Illness 80 y/o female s/p R TKA 05/22/20.   Clinical Impression   Pt seen for OT evaluation this date in setting of hospitalization s/p R TKA. Pt with significant pain impacting her ability to perform self care ADLs/ADL mobility. In addition, pt somewhat fatigued/confused (seemingly related to having received pain medication). OT initiates necessary post-op education with patient, but will need to f/u next session as pt with limited carryover this date. Pt reports being INDEP at baseline, but presents on OT assessment requiring MIN/MOD A with RW for ADL transfers and mobility and requires MOD A for seated LB ADLs. At this time, recommending STR in [facility] setting upon d/c from hospital stay. Will continue to follow acutely. Pt left with all needs met and in reach, chair alarm set.     Follow Up Recommendations  SNF    Equipment Recommendations  3 in 1 bedside commode;Tub/shower seat    Recommendations for Other Services       Precautions / Restrictions Precautions Precautions: Fall Restrictions Weight Bearing Restrictions: Yes RLE Weight Bearing: Weight bearing as tolerated      Mobility Bed Mobility     General bed mobility comments: pt up to chair pre/post    Transfers Overall transfer level: Needs assistance Equipment used: Rolling walker (2 wheeled) Transfers: Sit to/from [employer] Sit to Stand: Min assist;Mod assist Stand pivot transfers: Min assist;Mod assist       General transfer comment: increased time and effort, noted to be grimmacing, states she feels she over did it yesterday. Cues for safe use of RW.    Balance Overall balance assessment: Needs assistance Sitting-balance support: Bilateral upper extremity supported Sitting balance-Leahy Scale: Fair Sitting balance - Comments: use of UEs to  sustain sitting balance   Standing balance support: Bilateral upper extremity supported Standing balance-Leahy Scale: Poor Standing balance comment: noted to heavily rely on RW for UE support to offset weight from LEs 2/2 pain, requires external support, some decreased confidence and small steps                           ADL either performed or assessed with clinical judgement   ADL                                         General ADL Comments: requires MOD A for LB ADLs d/t some post-op pain and limited ROM in R knee (as expected), MIN A with RW for ADL transfers and fxl mobility and SETUP for seated UB ADLs.     Vision Baseline Vision/History: Wears glasses Wears Glasses: At all times Patient Visual Report: No change from baseline       Perception     Praxis      Pertinent Vitals/Pain Pain Assessment: 0-10 Pain Score: 8  Pain Location: R knee Pain Descriptors / Indicators: Aching;Grimacing;Guarding Pain Intervention(s): Limited activity within patient's tolerance;Monitored during session;Repositioned;Other (comment);Ice applied (notfiied Charity fundraiser)     Higher education careers adviser     Extremity/Trunk Assessment Upper Extremity Assessment Upper Extremity Assessment: Overall WFL for tasks assessed;Generalized weakness   Lower Extremity Assessment Lower Extremity Assessment: Defer to PT evaluation       Communication Communication Communication: No difficulties   Cognition Arousal/Alertness: Awake/alert Behavior During  Therapy: WFL for tasks assessed/performed Overall Cognitive Status: No family/caregiver present to determine baseline cognitive functioning                                 General Comments: pt somewhat confused but appears to be more r/t pain medication at this time. She is mostly appropriate, able to follow all commands, but with some delayed processing.   General Comments  Pt more confused than she has been previously and  unable to coordinate multi-step tasks, needed excessive cuing for basic tasks.  Pt pleasant, motivated, but not herself this afternoon.    Exercises  Other Exercises Other Exercises: OT inititates ed re: polar care, compression stocking, and AE for LB ADLs, but pt with limited ability to attend to all education, seems fatigued from pain meds. Will continue to f/u with education necessary for post-op success.   Shoulder Instructions      Home Living Family/patient expects to be discharged to:: Private residence Living Arrangements: Children Available Help at Discharge: Family;Available PRN/intermittently (daughter gone (work) 12 hr/day) Type of Home: House Home Access: Stairs to enter Secretary/administrator of Steps: 2 Entrance Stairs-Rails: None Home Layout: Two level;1/2 bath on main level Alternate Level Stairs-Number of Steps: 20             Home Equipment: Walker - 2 wheels;Bedside commode          Prior Functioning/Environment Level of Independence: Independent with assistive device(s)        Comments: Pt has recently been limping around with cane , per daughter "she probably should have been using the walker."  She does not drive but is able to get out in the community when the opportunity arises        OT Problem List: Decreased strength;Decreased range of motion;Decreased activity tolerance;Decreased safety awareness;Decreased knowledge of use of DME or AE;Pain      OT Treatment/Interventions: Self-care/ADL training;DME and/or AE instruction;Therapeutic activities;Balance training;Therapeutic exercise;Patient/family education    OT Goals(Current goals can be found in the care plan section) Acute Rehab OT Goals Patient Stated Goal: get back to walking w/o AD OT Goal Formulation: With patient Time For Goal Achievement: 06/06/20 Potential to Achieve Goals: Good ADL Goals Pt Will Perform Lower Body Dressing: with min guard assist;sit to/from stand (with LRAD and  AE PRN) Pt Will Transfer to Toilet: ambulating;grab bars;with min guard assist (with LRAD to/from restroom) Pt Will Perform Toileting - Clothing Manipulation and hygiene: with min guard assist;sit to/from stand Pt/caregiver will Perform Home Exercise Program: Increased strength;Both right and left upper extremity;With minimal assist Additional ADL Goal #1: Pt will verbalize 100% understanding of polar care, compression stocking and positioning mgt for post-op TKR mgt with no cues and minimal use of handout provided.  OT Frequency: Min 1X/week   Barriers to D/C:            Co-evaluation              AM-PAC OT "6 Clicks" Daily Activity     Outcome Measure Help from another person eating meals?: None Help from another person taking care of personal grooming?: A Little Help from another person toileting, which includes using toliet, bedpan, or urinal?: A Lot Help from another person bathing (including washing, rinsing, drying)?: A Lot Help from another person to put on and taking off regular upper body clothing?: A Little Help from another person to put on and taking off  regular lower body clothing?: A Lot 6 Click Score: 16   End of Session Equipment Utilized During Treatment: Gait belt;Rolling walker Nurse Communication: Mobility status;Other (comment) (pain)  Activity Tolerance: Patient tolerated treatment well Patient left: in chair;with call bell/phone within reach;with chair alarm set  OT Visit Diagnosis: Unsteadiness on feet (R26.81);Muscle weakness (generalized) (M62.81)                Time: 5075-7322 OT Time Calculation (min): 51 min Charges:  OT General Charges $OT Visit: 1 Visit OT Evaluation $OT Eval Moderate Complexity: 1 Mod OT Treatments $Self Care/Home Management : 23-37 mins $Therapeutic Activity: 8-22 mins  Gerrianne Scale, MS, OTR/L ascom [phone] 05/23/20, 5:40 PM

## 2020-05-23 NOTE — Progress Notes (Addendum)
   Subjective: 1 Day Post-Op Procedure(s) (LRB): TOTAL KNEE ARTHROPLASTY - Adriana Sanders to Assist (Right) Patient reports pain as mild.   Patient is well, and has had no acute complaints or problems Denies any CP, SOB, ABD pain. We will continue therapy today.  Plan is to go Skilled nursing facility after hospital stay.  Objective: Vital signs in last 24 hours: Temp:  [97 F (36.1 C)-99.1 F (37.3 C)] 98.5 F (36.9 C) (03/23 0802) Pulse Rate:  [42-131] 62 (03/23 0802) Resp:  [14-18] 16 (03/23 0802) BP: (93-161)/(34-102) 95/57 (03/23 0802) SpO2:  [91 %-100 %] 94 % (03/23 0802) Weight:  [76.2 kg] 76.2 kg (03/22 0826)  Intake/Output from previous day: 03/22 0701 - 03/23 0700 In: 2458.8 [I.V.:2258.8; IV Piggyback:200] Out: 510 [Urine:460; Blood:50] Intake/Output this shift: No intake/output data recorded.  Recent Labs    05/23/20 0544  HGB 10.5*   Recent Labs    05/23/20 0544  WBC 7.2  RBC 3.55*  HCT 30.8*  PLT 175   Recent Labs    05/23/20 0544  NA 128*  K 3.8  CL 99  CO2 22  BUN 20  CREATININE 1.40*  GLUCOSE 137*  CALCIUM 8.8*   No results for input(s): LABPT, INR in the last 72 hours.  EXAM General - Patient is Alert, Appropriate and Oriented Extremity - Neurovascular intact Sensation intact distally Intact pulses distally No cellulitis present Compartment soft Dressing - dressing C/D/I and no drainage, prevena intact with 150cc drainage Motor Function - intact, moving foot and toes well on exam.   Past Medical History:  Diagnosis Date  . Anxiety   . Arthritis    both knees  . Cancer ([place])    basal cell of skin, thyroid cancer.  had radiation  . Depression   . Diabetes mellitus without complication ([place])   . Dysrhythmia    skips a beat  . Hyperlipemia   . Hypertension   . Hyperthyroidism   . Neuromuscular disorder ([place])   . Panic disorder   . Parkinsonism ([place])   . Stroke ([place]) 2001    Assessment/Plan:   1 Day Post-Op Procedure(s)  (LRB): TOTAL KNEE ARTHROPLASTY - Adriana Sanders to Assist (Right) Active Problems:   S/P TKR (total knee replacement) using cement, right  Estimated body mass index is 29.76 kg/m as calculated from the following:   Height as of this encounter: 5\' 3"  (1.6 m).   Weight as of this encounter: 76.2 kg. Advance diet Up with therapy  VSS, soft BP. Hold HCTZ, lisinopril. Continue IV fluids Pain well controlled Recheck labs in the am CM to assist with discharge to SNF   DVT Prophylaxis - Aspirin, TED hose and aggrenox, SCDs Weight-Bearing as tolerated to right leg   T. Adriana Hora, PA-C [place] 05/23/2020, 8:10 AM

## 2020-05-23 NOTE — Anesthesia Postprocedure Evaluation (Signed)
Anesthesia Post Note  Patient: Adriana Sanders  Procedure(s) Performed: TOTAL KNEE ARTHROPLASTY - Rachelle Hora to Assist (Right Knee)  Patient location during evaluation: Nursing Unit Anesthesia Type: Spinal Level of consciousness: oriented and awake and alert Pain management: pain level controlled Vital Signs Assessment: post-procedure vital signs reviewed and stable Respiratory status: spontaneous breathing and respiratory function stable Cardiovascular status: blood pressure returned to baseline and stable Postop Assessment: no headache, no backache, no apparent nausea or vomiting and patient able to bend at knees Anesthetic complications: no   No complications documented.   Last Vitals:  Vitals:   05/23/20 0457 05/23/20 0802  BP: (!) 95/54 (!) 95/57  Pulse: (!) 59 62  Resp: 18 16  Temp: 36.9 C 36.9 C  SpO2: 91% 94%    Last Pain:  Vitals:   05/23/20 0802  TempSrc: Oral  PainSc:                  Brantley Fling

## 2020-05-23 NOTE — Plan of Care (Signed)
  Problem: Education: Goal: Knowledge of General Education information will improve Description: Including pain rating scale, medication(s)/side effects and non-pharmacologic comfort measures Outcome: Progressing   Problem: Health Behavior/Discharge Planning: Goal: Ability to manage health-related needs will improve Outcome: Progressing   Problem: Clinical Measurements: Goal: Ability to maintain clinical measurements within normal limits will improve Outcome: Progressing Goal: Will remain free from infection Outcome: Progressing Goal: Diagnostic test results will improve Outcome: Progressing Goal: Respiratory complications will improve Outcome: Progressing Goal: Cardiovascular complication will be avoided Outcome: Progressing   Problem: Activity: Goal: Risk for activity intolerance will decrease Outcome: Progressing   Problem: Nutrition: Goal: Adequate nutrition will be maintained Outcome: Progressing   Problem: Coping: Goal: Level of anxiety will decrease Outcome: Progressing   Problem: Elimination: Goal: Will not experience complications related to bowel motility Outcome: Progressing Goal: Will not experience complications related to urinary retention Outcome: Progressing   Problem: Pain Managment: Goal: General experience of comfort will improve Outcome: Progressing   Problem: Safety: Goal: Ability to remain free from injury will improve Outcome: Progressing   Problem: Skin Integrity: Goal: Risk for impaired skin integrity will decrease Outcome: Progressing   Problem: Education: Goal: Knowledge of the prescribed therapeutic regimen will improve Outcome: Progressing Goal: Individualized Educational Video(s) Outcome: Progressing   Problem: Activity: Goal: Ability to avoid complications of mobility impairment will improve Outcome: Progressing Goal: Range of joint motion will improve Outcome: Progressing   Problem: Clinical Measurements: Goal:  Postoperative complications will be avoided or minimized Outcome: Progressing   Problem: Pain Management: Goal: Pain level will decrease with appropriate interventions Outcome: Progressing   Problem: Skin Integrity: Goal: Will show signs of wound healing Outcome: Progressing   Problem: Education: Goal: Knowledge of General Education information will improve Description: Including pain rating scale, medication(s)/side effects and non-pharmacologic comfort measures Outcome: Progressing   Problem: Health Behavior/Discharge Planning: Goal: Ability to manage health-related needs will improve Outcome: Progressing   Problem: Clinical Measurements: Goal: Ability to maintain clinical measurements within normal limits will improve Outcome: Progressing Goal: Will remain free from infection Outcome: Progressing Goal: Diagnostic test results will improve Outcome: Progressing Goal: Respiratory complications will improve Outcome: Progressing Goal: Cardiovascular complication will be avoided Outcome: Progressing   Problem: Activity: Goal: Risk for activity intolerance will decrease Outcome: Progressing   Problem: Nutrition: Goal: Adequate nutrition will be maintained Outcome: Progressing   Problem: Coping: Goal: Level of anxiety will decrease Outcome: Progressing   Problem: Pain Managment: Goal: General experience of comfort will improve Outcome: Progressing   Problem: Safety: Goal: Ability to remain free from injury will improve Outcome: Progressing   Problem: Skin Integrity: Goal: Risk for impaired skin integrity will decrease Outcome: Progressing   Problem: Education: Goal: Knowledge of the prescribed therapeutic regimen will improve Outcome: Progressing Goal: Individualized Educational Video(s) Outcome: Progressing   Problem: Activity: Goal: Ability to avoid complications of mobility impairment will improve Outcome: Progressing Goal: Range of joint motion will  improve Outcome: Progressing   Problem: Pain Management: Goal: Pain level will decrease with appropriate interventions Outcome: Progressing

## 2020-05-23 NOTE — NC FL2 (Signed)
[place] LEVEL OF CARE SCREENING TOOL     IDENTIFICATION  Patient Name: Adriana Sanders Birthdate: Apr 22, 1940 Sex: female Admission Date (Current Location): 05/22/2020  [place] and [place] Number:  Engineering geologist and Address:  [facility], [address], [place], [place] [address]      Provider Number: [id number]  Attending Physician Name and Address:  Hessie Knows, MD  Relative Name and Phone Number:       Current Level of Care: Hospital Recommended Level of Care: [place] Prior Approval Number:    Date Approved/Denied:   PASRR Number: [id number]  Discharge Plan: SNF    Current Diagnoses: Patient Active Problem List   Diagnosis Date Noted  . S/P TKR (total knee replacement) using cement, right 05/22/2020  . Leg pain 01/14/2019  . PAD (peripheral artery disease) ([place]) 01/14/2019  . Anxiety 01/10/2019  . Depression 01/10/2019  . Diabetes mellitus type 2, uncomplicated ([place]) 90/24/0973  . Hyperlipidemia 01/10/2019  . Hypertension 01/10/2019  . Panic disorder 01/10/2019  . Stroke ([place]) 01/10/2019  . Parkinson disease ([place]) 01/10/2019  . S/P hip hemiarthroplasty 06/13/2017  . Age-related osteoporosis with current pathological fracture with routine healing 04/27/2017  . Closed left hip fracture ([place]) 04/22/2017    Orientation RESPIRATION BLADDER Height & Weight     Self,Time,Situation,Place  Normal Continent Weight: 168 lb (76.2 kg) Height:  5\' 3"  (160 cm)  BEHAVIORAL SYMPTOMS/MOOD NEUROLOGICAL BOWEL NUTRITION STATUS   (None)  (None) Continent Diet (Carb modified)  AMBULATORY STATUS COMMUNICATION OF NEEDS Skin   Limited Assist Verbally Surgical wounds,Wound Vac (Prevena wound vac on right leg/anterior knee.)                       Personal Care Assistance Level of Assistance  Bathing,Feeding,Dressing Bathing Assistance: Limited assistance Feeding assistance: Limited assistance Dressing  Assistance: Limited assistance     Functional Limitations Info  Sight,Hearing,Speech Sight Info: Adequate Hearing Info: Adequate Speech Info: Adequate    SPECIAL CARE FACTORS FREQUENCY  PT (By licensed PT),OT (By licensed OT)     PT Frequency: 5 x week OT Frequency: 5 x week            Contractures Contractures Info: Not present    Additional Factors Info  Code Status,Allergies Code Status Info: Full code Allergies Info: Vicodin (Hydrocodone-acetaminophen).           Current Medications (05/23/2020):  This is the current hospital active medication list Current Facility-Administered Medications  Medication Dose Route Frequency Provider Last Rate Last Admin  . 0.9 %  sodium chloride infusion   Intravenous Continuous Hessie Knows, MD 75 mL/hr at 05/23/20 5329 Infusion Verify at 05/23/20 0609  . acetaminophen (TYLENOL) tablet 1,000 mg  1,000 mg Oral Q6H Hessie Knows, MD   1,000 mg at 05/23/20 1208  . acetaminophen (TYLENOL) tablet 325-650 mg  325-650 mg Oral Q6H PRN Hessie Knows, MD      . ALPRAZolam Duanne Moron) tablet 1 mg  1 mg Oral Daily PRN Hessie Knows, MD      . alum & mag hydroxide-simeth (MAALOX/MYLANTA) 200-200-20 MG/5ML suspension 30 mL  30 mL Oral Q4H PRN Hessie Knows, MD      . aspirin chewable tablet 81 mg  81 mg Oral BID Hessie Knows, MD      . atorvastatin (LIPITOR) tablet 10 mg  10 mg Oral BID Hessie Knows, MD   10 mg at 05/23/20 0825  . bisacodyl (DULCOLAX) suppository 10  mg  10 mg Rectal Daily PRN Hessie Knows, MD      . carbidopa-levodopa (SINEMET IR) 25-100 MG per tablet immediate release 1 tablet  1 tablet Oral QPM Hessie Knows, MD   1 tablet at 05/22/20 2111  . diphenhydrAMINE (BENADRYL) 12.5 MG/5ML elixir 12.5-25 mg  12.5-25 mg Oral Q4H PRN Hessie Knows, MD      . dipyridamole-aspirin (AGGRENOX) 200-25 MG per 12 hr capsule 1 capsule  1 capsule Oral BID Hessie Knows, MD   1 capsule at 05/23/20 7062  . docusate sodium (COLACE) capsule 100 mg  100 mg  Oral BID Hessie Knows, MD   100 mg at 05/22/20 2107  . FLUoxetine (PROZAC) capsule 40 mg  40 mg Oral Daily Hessie Knows, MD   40 mg at 05/23/20 0829  . fluticasone (FLONASE) 50 MCG/ACT nasal spray 2 spray  2 spray Each Nare Daily Hessie Knows, MD   2 spray at 05/23/20 0830  . lisinopril (ZESTRIL) tablet 20 mg  20 mg Oral Daily Hessie Knows, MD       And  . hydrochlorothiazide (HYDRODIURIL) tablet 25 mg  25 mg Oral Daily Hessie Knows, MD      . HYDROmorphone (DILAUDID) injection 0.5-1 mg  0.5-1 mg Intravenous Q4H PRN Hessie Knows, MD      . insulin aspart (novoLOG) injection 0-15 Units  0-15 Units Subcutaneous TID WC Hessie Knows, MD      . loratadine (CLARITIN) tablet 10 mg  10 mg Oral Daily Hessie Knows, MD   10 mg at 05/23/20 3762  . magnesium citrate solution 1 Bottle  1 Bottle Oral Once PRN Hessie Knows, MD      . magnesium hydroxide (MILK OF MAGNESIA) suspension 30 mL  30 mL Oral Daily PRN Hessie Knows, MD      . menthol-cetylpyridinium (CEPACOL) lozenge 3 mg  1 lozenge Oral PRN Hessie Knows, MD       Or  . phenol (CHLORASEPTIC) mouth spray 1 spray  1 spray Mouth/Throat PRN Hessie Knows, MD      . metFORMIN (GLUCOPHAGE) tablet 500 mg  500 mg Oral BID WC Hessie Knows, MD   500 mg at 05/23/20 0825  . methimazole (TAPAZOLE) tablet 10 mg  10 mg Oral Daily Hessie Knows, MD   10 mg at 05/23/20 0830  . methocarbamol (ROBAXIN) tablet 500 mg  500 mg Oral Q6H PRN Hessie Knows, MD   500 mg at 05/22/20 2155   Or  . methocarbamol (ROBAXIN) 500 mg in dextrose 5 % 50 mL IVPB  500 mg Intravenous Q6H PRN Hessie Knows, MD      . metoCLOPramide (REGLAN) tablet 5-10 mg  5-10 mg Oral Q8H PRN Hessie Knows, MD       Or  . metoCLOPramide (REGLAN) injection 5-10 mg  5-10 mg Intravenous Q8H PRN Hessie Knows, MD      . metoprolol tartrate (LOPRESSOR) tablet 100 mg  100 mg Oral BID Hessie Knows, MD   100 mg at 05/23/20 0825  . ondansetron (ZOFRAN) tablet 4 mg  4 mg Oral Q6H PRN Hessie Knows, MD    4 mg at 05/22/20 1550   Or  . ondansetron [facility]) injection 4 mg  4 mg Intravenous Q6H PRN Hessie Knows, MD      . oxyCODONE (Oxy IR/ROXICODONE) immediate release tablet 10-15 mg  10-15 mg Oral Q4H PRN Hessie Knows, MD   10 mg at 05/22/20 1549  . oxyCODONE (Oxy IR/ROXICODONE) immediate release tablet 5-10 mg  5-10  mg Oral Q4H PRN Hessie Knows, MD   10 mg at 05/23/20 4076  . pantoprazole (PROTONIX) EC tablet 40 mg  40 mg Oral Daily Hessie Knows, MD   40 mg at 05/23/20 0826  . pioglitazone (ACTOS) tablet 15 mg  15 mg Oral Daily Hessie Knows, MD   15 mg at 05/23/20 0831  . traMADol (ULTRAM) tablet 50 mg  50 mg Oral Q6H Hessie Knows, MD   50 mg at 05/23/20 1209  . traZODone (DESYREL) tablet 100 mg  100 mg Oral QHS Hessie Knows, MD   100 mg at 05/22/20 2107  . vitamin B-12 (CYANOCOBALAMIN) tablet 500 mcg  500 mcg Oral Daily Hessie Knows, MD   500 mcg at 05/23/20 0831  . zolpidem (AMBIEN) tablet 5 mg  5 mg Oral QHS PRN Hessie Knows, MD         Discharge Medications: Please see discharge summary for a list of discharge medications.  Relevant Imaging Results:  Relevant Lab Results:   Additional Information SS#: [id number]. Pfizer 05/05/19 and 05/27/19. No booster.  Candie Chroman, LCSW

## 2020-05-23 NOTE — Progress Notes (Signed)
Physical Therapy Treatment Patient Details Name: Adriana Sanders MRN: [id number] DOB: 01/24/1941 Today's Date: 05/23/2020    History of Present Illness 80 y/o female s/p R TKA 05/22/20.    PT Comments    Pt continues to be pleasant and motivated but she did not do particularly well this afternoon.  She was able to do some exercises and some mobility/limited ambulation but struggled with each of these tasks. A lot of the issue did seem to stem from confusion, likely from meds?  She needed more assist with mobility than she previous had, had less pain tolerance with exercises and generally could not keep her thoughts and focus organized on cues/tasks/etc from PT.  Pt still at an appropriate POD1 level, but again struggled this afternoon on many fronts.  Follow Up Recommendations  SNF     Equipment Recommendations  None recommended by PT    Recommendations for Other Services       Precautions / Restrictions Precautions Precautions: Fall Restrictions RLE Weight Bearing: Weight bearing as tolerated    Mobility  Bed Mobility Overal bed mobility: Needs Assistance Bed Mobility: Supine to Sit     Supine to sit: Mod assist     General bed mobility comments: Pt struggling to organize bed mobility, reaching for tubes/wires, unable to coordinate weight shifts.  Ultimately she needed a lot more assist today than 24 hrs ago    Transfers Overall transfer level: Needs assistance Equipment used: Rolling walker (2 wheeled) Transfers: Sit to/from Stand Sit to Stand: Min assist;Mod assist         General transfer comment: 4 sit to stand efforts this session.  Pt did not need heavy assist on any attempt, but 3 of 4 she did need at least some assist and on 3rd attempt she had a LOB falling to the R and needing heavy assist to maintain upright  Ambulation/Gait Ambulation/Gait assistance: Min guard;Min assist Gait Distance (Feet): 8 Feet Assistive device: Rolling walker (2 wheeled)        General Gait Details: 2 seperate bouts of modest ambulation (83ft, 6 ft)Similar to this AM pt struggled to take more than choppy, shuffling steps and needed a lot of simple direct cues just to perform basic modest bout of ambulation, but she was more confused and in mor pain than this morning and "just didn't have it" during PM session   Stairs             Wheelchair Mobility    Modified Rankin (Stroke Patients Only)       Balance Overall balance assessment: Needs assistance Sitting-balance support: Bilateral upper extremity supported Sitting balance-Leahy Scale: Fair Sitting balance - Comments: even with assist to get to EOB and use UE pt struggled to maintain static sitting, generally leaning back and to the R, multiple times needing assist to keep from tipping over   Standing balance support: Bilateral upper extremity supported Standing balance-Leahy Scale: Poor Standing balance comment: Pt lacking confidence and awareness in standing, did have one LOBs needing heavy direct assist to arrest the fall                            Cognition Arousal/Alertness: Awake/alert Behavior During Therapy: WFL for tasks assessed/performed Overall Cognitive Status: Within Functional Limits for tasks assessed  Exercises Total Joint Exercises Ankle Circles/Pumps: AROM;10 reps Quad Sets: Strengthening;10 reps Short Arc Quad: AROM;Strengthening;10 reps Heel Slides: AROM;10 reps Hip ABduction/ADduction: AROM;Strengthening;10 reps Straight Leg Raises: AROM;10 reps Long Arc Quad: AROM;10 reps Knee Flexion: PROM;5 reps Goniometric ROM: >90* flexion    General Comments General comments (skin integrity, edema, etc.): Pt more confused than she has been previously and unable to coordinate multi-step tasks, needed excessive cuing for basic tasks.  Pt pleasant, motivated, but not herself this afternoon.      Pertinent  Vitals/Pain Pain Assessment: 0-10 Pain Score: 8  Pain Location: R knee    Home Living                      Prior Function            PT Goals (current goals can now be found in the care plan section) Progress towards PT goals: Not progressing toward goals - comment (pt confused, hurting and has had mobility digression)    Frequency    BID      PT Plan Current plan remains appropriate    Co-evaluation              AM-PAC PT "6 Clicks" Mobility   Outcome Measure  Help needed turning from your back to your side while in a flat bed without using bedrails?: A Lot Help needed moving from lying on your back to sitting on the side of a flat bed without using bedrails?: A Lot Help needed moving to and from a bed to a chair (including a wheelchair)?: A Lot Help needed standing up from a chair using your arms (e.g., wheelchair or bedside chair)?: A Lot Help needed to walk in hospital room?: A Lot Help needed climbing 3-5 steps with a railing? : Total 6 Click Score: 11    End of Session Equipment Utilized During Treatment: Gait belt Activity Tolerance: Patient limited by pain;Patient tolerated treatment well Patient left: with chair alarm set;with call bell/phone within reach;with nursing/sitter in room;with family/visitor present Nurse Communication: Mobility status PT Visit Diagnosis: Muscle weakness (generalized) (M62.81);Difficulty in walking, not elsewhere classified (R26.2);Pain Pain - Right/Left: Right Pain - part of body: Knee     Time: 4098-1191 PT Time Calculation (min) (ACUTE ONLY): 32 min  Charges:  $Gait Training: 8-22 mins $Therapeutic Exercise: 8-22 mins $Therapeutic Activity: 8-22 mins                     Kreg Shropshire, DPT 05/23/2020, 4:14 PM

## 2020-05-23 NOTE — Progress Notes (Signed)
Nutrition Brief Note  Patient identified on the Malnutrition Screening Tool (MST) Report  Wt Readings from Last 15 Encounters:  05/22/20 76.2 kg  05/11/20 76.2 kg  01/10/19 80.7 kg  05/08/17 70.9 kg  04/22/17 68 kg  02/05/15 66.7 kg   Adriana Sanders is a 80 y.o. female here for history and physical for right total knee arthroplasty with Dr. Hessie Knows on 05/22/2020. Patient has had progressive right knee pain for 2 to 3 years. 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   Wt has been stable over the past 3 years.   Nutrition-Focused physical exam completed. Findings are no fat depletion, no muscle depletion, and no edema.   Medications and include sinemet, colace, and vitamin B-12, and 0.9% sodium chloride infusion.   Labs reviewed: CBGS: 102-169 (inpatient orders for glycemic control are 15 mg 0-15 units insulin aspart TID with meals, 500 mg metformin BID, and 15 mg pioglitazone daily).   Body mass index is 29.76 kg/m. Patient meets criteria for overweight based on current BMI.   Current diet order is carb modified, patient is consuming approximately 90% of meals at this time. Labs and medications reviewed.   No nutrition interventions warranted at this time. If nutrition issues arise, please consult RD.   Loistine Chance, RD, LDN, [place] Registered Dietitian II Certified Diabetes Care and Education Specialist Please refer to [facility] for RD and/or RD on-call/weekend/after hours pager

## 2020-05-23 NOTE — TOC Initial Note (Signed)
Transition of Care [facility]) - Initial/Assessment Note    Patient Details  Name: Adriana Sanders MRN: [id number] Date of Birth: [birth year]  [facility]) CM/SW Contact:    Candie Chroman, LCSW Phone Number: 05/23/2020, 2:23 PM  Clinical Narrative: CSW met with patient. No supports at bedside. CSW introduced role and explained that PT recommendations would be discussed. Patient is agreeable to SNF placement. She went to [facility] in 2019 but CSW explained that they no longer accept patients that do not already live there. CSW will send referral to local facilities and follow up with bed offers. Offered to call family but patient said she would update her daughter later. No further concerns. CSW encouraged patient to contact CSW as needed. CSW will continue to follow patient for support and facilitate discharge to SNF once medically stable.                 Expected Discharge Plan: Skilled Nursing Facility Barriers to Discharge: Continued Medical Work up   Patient Goals and CMS Choice   CMS Medicare.gov Compare Post Acute Care list provided to:: Patient    Expected Discharge Plan and Services Expected Discharge Plan: [place] Choice: [place] arrangements for the past 2 months: Single Family Home                                      Prior Living Arrangements/Services Living arrangements for the past 2 months: Single Family Home Lives with:: Adult Children Patient language and need for interpreter reviewed:: Yes Do you feel safe going back to the place where you live?: Yes      Need for Family Participation in Patient Care: Yes (Comment) Care giver support system in place?: Yes (comment)   Criminal Activity/Legal Involvement Pertinent to Current Situation/Hospitalization: No - Comment as needed  Activities of Daily Living Home Assistive Devices/Equipment: Cane (specify quad or straight),Grab bars in  shower,Eyeglasses,Dentures (specify type),Shower chair without back,Raised toilet seat with rails,Walker (specify type) ADL Screening (condition at time of admission) Patient's cognitive ability adequate to safely complete daily activities?: Yes Is the patient deaf or have difficulty hearing?: No Does the patient have difficulty seeing, even when wearing glasses/contacts?: No Does the patient have difficulty concentrating, remembering, or making decisions?: No Patient able to express need for assistance with ADLs?: Yes Does the patient have difficulty dressing or bathing?: Yes Independently performs ADLs?: Yes (appropriate for developmental age) Does the patient have difficulty walking or climbing stairs?: Yes Weakness of Legs: Right Weakness of Arms/Hands: None  Permission Sought/Granted Permission sought to share information with : Facility Art therapist granted to share information with : Yes, Verbal Permission Granted     Permission granted to share info w AGENCY: SNF's        Emotional Assessment Appearance:: Appears stated age Attitude/Demeanor/Rapport: Engaged,Gracious Affect (typically observed): Accepting,Appropriate,Calm,Pleasant Orientation: : Oriented to Self,Oriented to Place,Oriented to  Time,Oriented to Situation Alcohol / Substance Use: Not Applicable Psych Involvement: No (comment)  Admission diagnosis:  S/P TKR (total knee replacement) using cement, right [Z96.651] Patient Active Problem List   Diagnosis Date Noted  . S/P TKR (total knee replacement) using cement, right 05/22/2020  . Leg pain 01/14/2019  . PAD (peripheral artery disease) ([place]) 01/14/2019  . Anxiety 01/10/2019  . Depression 01/10/2019  . Diabetes mellitus type 2, uncomplicated ([place]) 37/62/8315  .  Hyperlipidemia 01/10/2019  . Hypertension 01/10/2019  . Panic disorder 01/10/2019  . Stroke ([place]) 01/10/2019  . Parkinson disease ([place]) 01/10/2019  . S/P hip hemiarthroplasty  06/13/2017  . Age-related osteoporosis with current pathological fracture with routine healing 04/27/2017  . Closed left hip fracture ([place]) 04/22/2017   PCP:  [place] Pink, MD Pharmacy:   [id number] - [place], [place] [address] [place] [address] Phone: [phone] Fax: [phone]     Social Determinants of Health (SDOH) Interventions    Readmission Risk Interventions No flowsheet data found.

## 2020-05-24 LAB — CBC
HCT: 28.4 % — ABNORMAL LOW (ref 36.0–46.0)
Hemoglobin: 9.6 g/dL — ABNORMAL LOW (ref 12.0–15.0)
MCH: 29.3 pg (ref 26.0–34.0)
MCHC: 33.8 g/dL (ref 30.0–36.0)
MCV: 86.6 fL (ref 80.0–100.0)
Platelets: 145 10*3/uL — ABNORMAL LOW (ref 150–400)
RBC: 3.28 MIL/uL — ABNORMAL LOW (ref 3.87–5.11)
RDW: 14 % (ref 11.5–15.5)
WBC: 7.2 10*3/uL (ref 4.0–10.5)
nRBC: 0 % (ref 0.0–0.2)

## 2020-05-24 LAB — GLUCOSE, CAPILLARY
Glucose-Capillary: 98 mg/dL (ref 70–99)
Glucose-Capillary: 98 mg/dL (ref 70–99)
Glucose-Capillary: 111 mg/dL — ABNORMAL HIGH (ref 70–99)
Glucose-Capillary: 94 mg/dL (ref 70–99)
Glucose-Capillary: 99 mg/dL (ref 70–99)

## 2020-05-24 LAB — BASIC METABOLIC PANEL
Anion gap: 10 (ref 5–15)
BUN: 16 mg/dL (ref 8–23)
CO2: 22 mmol/L (ref 22–32)
Calcium: 9.4 mg/dL (ref 8.9–10.3)
Chloride: 101 mmol/L (ref 98–111)
Creatinine, Ser: 1.04 mg/dL — ABNORMAL HIGH (ref 0.44–1.00)
GFR, Estimated: 55 mL/min — ABNORMAL LOW (ref >60)
Glucose, Bld: 108 mg/dL — ABNORMAL HIGH (ref 70–99)
Potassium: 3.8 mmol/L (ref 3.5–5.1)
Sodium: 133 mmol/L — ABNORMAL LOW (ref 135–145)

## 2020-05-24 LAB — URINALYSIS, COMPLETE (UACMP) WITH MICROSCOPIC
Bilirubin Urine: NEGATIVE
Glucose, UA: NEGATIVE mg/dL
Hgb urine dipstick: NEGATIVE
Ketones, ur: 5 mg/dL — AB
Leukocytes,Ua: NEGATIVE
Nitrite: NEGATIVE
Protein, ur: NEGATIVE mg/dL
Specific Gravity, Urine: 1.009 (ref 1.005–1.030)
pH: 5 (ref 5.0–8.0)

## 2020-05-24 MED ORDER — ASPIRIN 81 MG PO CHEW
81.0000 mg | CHEWABLE_TABLET | Freq: Two times a day (BID) | ORAL | 0 refills | Status: AC
Start: 2020-05-24 — End: ?

## 2020-05-24 MED ORDER — METHIMAZOLE 10 MG PO TABS: 10.0000 mg | ORAL_TABLET | Freq: Every day | ORAL | Status: DC

## 2020-05-24 MED ORDER — ACETAMINOPHEN 500 MG PO TABS: 500.0000 mg | ORAL_TABLET | Freq: Four times a day (QID) | ORAL | Status: DC | PRN

## 2020-05-24 NOTE — Progress Notes (Signed)
Patient confused to where she is. She thinks she is at home. She keeps trying to get out of the bed. Bed rails up & bed alarm on. Will continue to monitor.

## 2020-05-24 NOTE — Progress Notes (Signed)
Patient seems to be having some confusion. Patient has called out numerous times saying she needs to use the bathroom but everytime hasn't been able to void a lot. Purewick placed to eliminate getting out of bed due to weakness.   Patient called out in pain, was trying to get out of bed. Took purewick off because patient said she was in too much pain and needed to get up and use the bathroom. Another nurse and I helped get her up to the [facility] with a walker however, patient was very unsteady and weak. Do not recommend getting her up. Patient also will not keep her leg straight. She is refusing to wear the bone foam so towel is placed under ankle instead. Polar care in place. Foot pumps on.    Patient bladder scanned. 839 was noted. On call notified. Waiting for new orders.

## 2020-05-24 NOTE — Discharge Instructions (Signed)

## 2020-05-24 NOTE — Progress Notes (Addendum)
Physical Therapy Treatment Patient Details Name: Adriana Sanders MRN: [id number] DOB: Nov 24, 1940 Today's Date: 05/24/2020    History of Present Illness 80 y/o female s/p R TKA 05/22/20.    PT Comments    Pt received in supine position claiming that she needed to get up to use the bathroom.  Pt encouraged to participate with therapist and nursing staff to assist in getting to [facility].  Pt able to push through UE's on bed to come upright with maximal encouragement and min-modA from +2.  Pt then required verbal cuing to perform stand pivot transfer to the [facility], and at times assistance with the FWW in order to navigate in the room.  Self-care was performed and pt was then transferred back to bed to which she required modA for lifting R LE back into the bed.  Nursing present in room upon leaving and all needs met.  Current discharge plans to SNF remain appropriate at this time.  Pt will continue to benefit from skilled therapy in order to address deficits listed below.     Follow Up Recommendations  SNF     Equipment Recommendations  None recommended by PT    Recommendations for Other Services       Precautions / Restrictions Precautions Precautions: Fall Restrictions Weight Bearing Restrictions: Yes RLE Weight Bearing: Weight bearing as tolerated    Mobility  Bed Mobility Overal bed mobility: Needs Assistance Bed Mobility: Supine to Sit     Supine to sit: Mod assist     General bed mobility comments: Pt requires assistance from therapist via verbal and tactile cues for hand placement on rails to come into sitting, and modA for R LE to come to EOB.    Transfers Overall transfer level: Needs assistance Equipment used: Rolling walker (2 wheeled) Transfers: Sit to/from [facility] Sit to Stand: Mod assist;+2 physical assistance;Min assist         General transfer comment: Pt required min-modA from therapist and NT for assistance to come upright into standing  position and to perform stand pivot transfer to [facility].  Ambulation/Gait Ambulation/Gait assistance: Min guard;Min assist           General Gait Details: Gait declined due to safety concerns due to mentation/confusion.   Stairs             Wheelchair Mobility    Modified Rankin (Stroke Patients Only)       Balance Overall balance assessment: Needs assistance Sitting-balance support: Bilateral upper extremity supported Sitting balance-Leahy Scale: Fair Sitting balance - Comments: pt with increased posterior lean during treatment session this PM.   Standing balance support: Bilateral upper extremity supported Standing balance-Leahy Scale: Poor Standing balance comment: noted to heavily rely on RW for UE support to offset weight from LEs 2/2 pain, at times needs tactile assistance to navigate walker to perform stand pivot transfer.                            Cognition Arousal/Alertness: Suspect due to medications Behavior During Therapy: WFL for tasks assessed/performed Overall Cognitive Status: No family/caregiver present to determine baseline cognitive functioning                                 General Comments: Pt confused on location of where she is and who she is seeing.  Pt under the impressino that she is in her nurses  home.      Exercises      General Comments General comments (skin integrity, edema, etc.): Pt is much more confused this PM than around lunch time.  Pt unable to answer questions and believes she is in the home of her nurse, not in a hospital.      Pertinent Vitals/Pain Pain Assessment: Faces Faces Pain Scale: Hurts even more Pain Location: R knee Pain Descriptors / Indicators: Aching;Grimacing;Guarding Pain Intervention(s): Limited activity within patient's tolerance;Monitored during session;Premedicated before session;Ice applied    Home Living                      Prior Function            PT  Goals (current goals can now be found in the care plan section) Acute Rehab PT Goals Patient Stated Goal: get back to walking w/o AD PT Goal Formulation: With patient Time For Goal Achievement: 06/05/20 Potential to Achieve Goals: Fair Progress towards PT goals: Not progressing toward goals - comment (Pt unable to progress due to mentation/confusion.)    Frequency    BID      PT Plan Current plan remains appropriate    Co-evaluation              AM-PAC PT "6 Clicks" Mobility   Outcome Measure  Help needed turning from your back to your side while in a flat bed without using bedrails?: A Lot Help needed moving from lying on your back to sitting on the side of a flat bed without using bedrails?: A Lot Help needed moving to and from a bed to a chair (including a wheelchair)?: A Lot Help needed standing up from a chair using your arms (e.g., wheelchair or bedside chair)?: A Lot Help needed to walk in hospital room?: A Lot Help needed climbing 3-5 steps with a railing? : Total 6 Click Score: 11    End of Session Equipment Utilized During Treatment: Gait belt Activity Tolerance: Patient limited by pain;Patient tolerated treatment well Patient left: with nursing/sitter in room;in bed;with call bell/phone within reach;with bed alarm set Nurse Communication: Mobility status PT Visit Diagnosis: Muscle weakness (generalized) (M62.81);Difficulty in walking, not elsewhere classified (R26.2);Pain Pain - Right/Left: Right Pain - part of body: Knee     Time: 8832-5498 PT Time Calculation (min) (ACUTE ONLY): 39 min  Charges:  $Therapeutic Exercise: 8-22 mins $Self Care/Home Management: 23-37            Gwenlyn Saran, PT, DPT 05/24/20, 5:38 PM

## 2020-05-24 NOTE — Progress Notes (Signed)
I&O cath done. 800 mL drained from bladder.

## 2020-05-24 NOTE — Progress Notes (Signed)
Received order for in and out cath.

## 2020-05-24 NOTE — Discharge Summary (Addendum)
Physician Discharge Summary  Patient ID: Adriana Sanders MRN: [id number] DOB/AGE: August 09, 1940 80 y.o.  Admit date: 05/22/2020 Discharge date: 05/29/2020 Admission Diagnoses:  S/P TKR (total knee replacement) using cement, right [Z96.651]   Discharge Diagnoses: Patient Active Problem List   Diagnosis Date Noted  . S/P TKR (total knee replacement) using cement, right 05/22/2020  . Leg pain 01/14/2019  . PAD (peripheral artery disease) ([place]) 01/14/2019  . Anxiety 01/10/2019  . Depression 01/10/2019  . Diabetes mellitus type 2, uncomplicated ([place]) 23/76/2831  . Hyperlipidemia 01/10/2019  . Hypertension 01/10/2019  . Panic disorder 01/10/2019  . Stroke ([place]) 01/10/2019  . Parkinson disease ([place]) 01/10/2019  . S/P hip hemiarthroplasty 06/13/2017  . Age-related osteoporosis with current pathological fracture with routine healing 04/27/2017  . Closed left hip fracture ([place]) 04/22/2017    Past Medical History:  Diagnosis Date  . Anxiety   . Arthritis    both knees  . Cancer ([place])    basal cell of skin, thyroid cancer.  had radiation  . Depression   . Diabetes mellitus without complication ([place])   . Dysrhythmia    skips a beat  . Hyperlipemia   . Hypertension   . Hyperthyroidism   . Neuromuscular disorder ([place])   . Panic disorder   . Parkinsonism ([place])   . Stroke [facility]) 2001     Transfusion: none   Consultants (if any):   Discharged Condition: Improved  Hospital Course: [name] is an 80 y.o. female who was admitted 05/22/2020 with a diagnosis of right knee osteoarthritis and went to the operating room on 05/22/2020 and underwent the above named procedures.    Surgeries: Procedure(s): TOTAL KNEE ARTHROPLASTY - Rachelle Hora to Assist on 05/22/2020 Patient tolerated the surgery well. Taken to PACU where she was stabilized and then transferred to the orthopedic floor.  Started on aspirin and aggrenox, SCDs. Heels elevated on bed with rolled towels. No evidence of DVT.  Negative [place]. Physical therapy started on day #1 for gait training and transfer. OT started day #1 for ADL and assisted devices.  Patient's foley was d/c on day #1. Patient's IV was d/c on day #2. On post op day 2, patient noted to have AMS. Narcotics were discontinued. UA ruled out UTI. No signs of neuro deficit.  On post op day #3 patient was back to her normal mental status. No confusion.  The patient was held until postop day 4 before discharge because of placement.  Patient was stable and ready for discharge to SNF.  SNF was unable to accept patient until post op day 7. Patient was stable and ready for discharge on post op day 7.  Implants: Medacta GMK sphere system with 3+ rightfemur, 3 righttibia with short stem and 30mm insert. Size 1patella, all components cemented.                 TOTAL KNEE REPLACEMENT POSTOPERATIVE DIRECTIONS  Knee Rehabilitation, Guidelines Following Surgery  Results after knee surgery are often greatly improved when you follow the exercise, range of motion and muscle strengthening exercises prescribed by your doctor. Safety measures are also important to protect the knee from further injury. Any time any of these exercises cause you to have increased pain or swelling in your knee joint, decrease the amount until you are comfortable again and slowly increase them. If you have problems or questions, call your caregiver or physical therapist for advice.   HOME CARE INSTRUCTIONS  Remove items at home which could result in a  fall. This includes throw rugs or furniture in walking pathways.   Continue to use polar care unit on the knee for pain and swelling from surgery. You may notice swelling that will progress down to the foot and ankle.  This is normal after surgery.  Elevate the leg when you are not up walking on it.    Continue to use the breathing machine which will help keep your temperature down.  It is common for your temperature to  cycle up and down following surgery, especially at night when you are not up moving around and exerting yourself.  The breathing machine keeps your lungs expanded and your temperature down.  Do not place pillow under knee, focus on keeping the knee STRAIGHT while resting  DIET You may resume your previous home diet once your are discharged from the hospital.  DRESSING / WOUND CARE / SHOWERING Please remove provena negative pressure dressing on 06/04/2020 and apply honey comb dressing. Keep dressing clean and dry at all times.  ACTIVITY Walk with your walker as instructed. Use walker as long as suggested by your caregivers. Avoid periods of inactivity such as sitting longer than an hour when not asleep. This helps prevent blood clots.  You may resume a sexual relationship in one month or when given the OK by your doctor.  You may return to work once you are cleared by your doctor.  Do not drive a car for 6 weeks or until released by you surgeon.  Do not drive while taking narcotics.  WEIGHT BEARING Weight bearing as tolerated with assist device (walker, cane, etc) as directed, use it as long as suggested by your surgeon or therapist, typically at least 4-6 weeks.  POSTOPERATIVE CONSTIPATION PROTOCOL Constipation - defined medically as fewer than three stools per week and severe constipation as less than one stool per week.  One of the most common issues patients have following surgery is constipation.  Even if you have a regular bowel pattern at home, your normal regimen is likely to be disrupted due to multiple reasons following surgery.  Combination of anesthesia, postoperative narcotics, change in appetite and fluid intake all can affect your bowels.  In order to avoid complications following surgery, here are some recommendations in order to help you during your recovery period.  Colace (docusate) - Pick up an over-the-counter form of Colace or another stool softener and take twice a day  as long as you are requiring postoperative pain medications.  Take with a full glass of water daily.  If you experience loose stools or diarrhea, hold the colace until you stool forms back up.  If your symptoms do not get better within 1 week or if they get worse, check with your doctor.  Dulcolax (bisacodyl) - Pick up over-the-counter and take as directed by the product packaging as needed to assist with the movement of your bowels.  Take with a full glass of water.  Use this product as needed if not relieved by Colace only.   MiraLax (polyethylene glycol) - Pick up over-the-counter to have on hand.  MiraLax is a solution that will increase the amount of water in your bowels to assist with bowel movements.  Take as directed and can mix with a glass of water, juice, soda, coffee, or tea.  Take if you go more than two days without a movement. Do not use MiraLax more than once per day. Call your doctor if you are still constipated or irregular after using this medication  for 7 days in a row.  If you continue to have problems with postoperative constipation, please contact the office for further assistance and recommendations.  If you experience "the worst abdominal pain ever" or develop nausea or vomiting, please contact the office immediatly for further recommendations for treatment.  ITCHING  If you experience itching with your medications, try taking only a single pain pill, or even half a pain pill at a time.  You can also use Benadryl over the counter for itching or also to help with sleep.   TED HOSE STOCKINGS Wear the elastic stockings on both legs for six weeks following surgery during the day but you may remove then at night for sleeping.  MEDICATIONS See your medication summary on the "After Visit Summary" that the nursing staff will review with you prior to discharge.  You may have some home medications which will be placed on hold until you complete the course of blood thinner medication.   It is important for you to complete the blood thinner medication as prescribed by your surgeon.  Continue your approved medications as instructed at time of discharge.  PRECAUTIONS If you experience chest pain or shortness of breath - call 911 immediately for transfer to the hospital emergency department.  If you develop a fever greater that 101 F, purulent drainage from wound, increased redness or drainage from wound, foul odor from the wound/dressing, or calf pain - CONTACT YOUR SURGEON.                                                   FOLLOW-UP APPOINTMENTS Make sure you keep all of your appointments after your operation with your surgeon and caregivers. You should call the office at the above phone number and make an appointment for approximately two weeks after the date of your surgery or on the date instructed by your surgeon outlined in the "After Visit Summary".   RANGE OF MOTION AND STRENGTHENING EXERCISES  Rehabilitation of the knee is important following a knee injury or an operation. After just a few days of immobilization, the muscles of the thigh which control the knee become weakened and shrink (atrophy). Knee exercises are designed to build up the tone and strength of the thigh muscles and to improve knee motion. Often times heat used for twenty to thirty minutes before working out will loosen up your tissues and help with improving the range of motion but do not use heat for the first two weeks following surgery. These exercises can be done on a training (exercise) mat, on the floor, on a table or on a bed. Use what ever works the best and is most comfortable for you Knee exercises include:  Leg Lifts - While your knee is still immobilized in a splint or cast, you can do straight leg raises. Lift the leg to 60 degrees, hold for 3 sec, and slowly lower the leg. Repeat 10-20 times 2-3 times daily. Perform this exercise against resistance later as your knee gets better.  Quad and  Hamstring Sets - Tighten up the muscle on the front of the thigh (Quad) and hold for 5-10 sec. Repeat this 10-20 times hourly. Hamstring sets are done by pushing the foot backward against an object and holding for 5-10 sec. Repeat as with quad sets.   Leg Slides: Lying on your back,  slowly slide your foot toward your buttocks, bending your knee up off the floor (only go as far as is comfortable). Then slowly slide your foot back down until your leg is flat on the floor again.  Angel Wings: Lying on your back spread your legs to the side as far apart as you can without causing discomfort.  A rehabilitation program following serious knee injuries can speed recovery and prevent re-injury in the future due to weakened muscles. Contact your doctor or a physical therapist for more information on knee rehabilitation.   IF YOU ARE TRANSFERRED TO A SKILLED REHAB FACILITY If the patient is transferred to a skilled rehab facility following release from the hospital, a list of the current medications will be sent to the facility for the patient to continue.  When discharged from the skilled rehab facility, please have the facility set up the patient's [place] prior to being released. Also, the skilled facility will be responsible for providing the patient with their medications at time of release from the facility to include their pain medication, the muscle relaxants, and their blood thinner medication. If the patient is still at the rehab facility at time of the two week follow up appointment, the skilled rehab facility will also need to assist the patient in arranging follow up appointment in our office and any transportation needs.  MAKE SURE YOU:  Understand these instructions.  Get help right away if you are not doing well or get worse.                            She was given perioperative antibiotics:  Anti-infectives (From admission, onward)   Start      Dose/Rate Route Frequency Ordered Stop   05/22/20 1630  ceFAZolin (ANCEF) IVPB 2g/100 mL premix        2 g 200 mL/hr over 30 Minutes Intravenous Every 6 hours 05/22/20 1415 05/22/20 2327   05/22/20 0834  ceFAZolin (ANCEF) 2-4 GM/100ML-% IVPB       Note to Pharmacy: Olena Mater   : cabinet override      05/22/20 0834 05/22/20 2044   05/22/20 0745  ceFAZolin (ANCEF) IVPB 2g/100 mL premix        2 g 200 mL/hr over 30 Minutes Intravenous On call to O.R. 05/22/20 7517 05/22/20 1025    .  She was given sequential compression devices, early ambulation, and Aggrenox Aspirin TEDs for DVT prophylaxis.  She benefited maximally from the hospital stay and there were no complications.    Recent vital signs:  Vitals:   05/29/20 0719 05/29/20 0906  BP: (!) 159/66 127/61  Pulse: 70 (!) 59  Resp: 18 18  Temp: 97.8 F (36.6 C) 98 F (36.7 C)  SpO2: 96% 95%    Recent laboratory studies:  Lab Results  Component Value Date   HGB 9.6 (L) 05/25/2020   HGB 9.6 (L) 05/24/2020   HGB 10.5 (L) 05/23/2020   Lab Results  Component Value Date   WBC 6.8 05/25/2020   PLT 163 05/25/2020   Lab Results  Component Value Date   INR 0.95 04/22/2017   Lab Results  Component Value Date   NA 131 (L) 05/25/2020   K 4.0 05/25/2020   CL 99 05/25/2020   CO2 24 05/25/2020   BUN 11 05/25/2020   CREATININE 0.80 05/25/2020   GLUCOSE 94 05/25/2020    Discharge Medications:   Allergies as of  05/29/2020      Reactions   Vicodin [hydrocodone-acetaminophen] Anaphylaxis   Unsure of any immediate reaction, but 24 hours had a stroke.  Medical MD thought this may have been related.      Medication List    STOP taking these medications   oxyCODONE 5 MG immediate release tablet Commonly known as: Oxy IR/ROXICODONE   traMADol 50 MG tablet Commonly known as: ULTRAM     TAKE these medications   acetaminophen 500 MG tablet Commonly known as: TYLENOL Take 1-2 tablets (500-1,000 mg total) by mouth every  6 (six) hours as needed for mild pain (pain score 1-3 or temp > 100.5). What changed:   how much to take  when to take this  reasons to take this   ALPRAZolam 1 MG tablet Commonly known as: XANAX Take 1 mg by mouth daily as needed for anxiety.   aspirin 81 MG chewable tablet Chew 1 tablet (81 mg total) by mouth 2 (two) times daily.   atorvastatin 10 MG tablet Commonly known as: LIPITOR Take 10 mg by mouth in the morning and at bedtime.   bisacodyl 5 MG EC tablet Commonly known as: DULCOLAX Take 1 tablet (5 mg total) by mouth daily as needed for moderate constipation.   carbidopa-levodopa 25-100 MG tablet Commonly known as: SINEMET IR Take 1 tablet by mouth every evening.   Cholecalciferol 100 MCG (4000 UT) Caps Take 1 capsule by mouth daily.   dipyridamole-aspirin 200-25 MG 12hr capsule Commonly known as: AGGRENOX Take 1 capsule by mouth 2 (two) times daily.   fexofenadine 180 MG tablet Commonly known as: ALLEGRA Take 180 mg by mouth daily.   FLUoxetine 40 MG capsule Commonly known as: PROZAC Take 40 mg by mouth daily.   fluticasone 50 MCG/ACT nasal spray Commonly known as: FLONASE Place 2 sprays into both nostrils daily.   lisinopril-hydrochlorothiazide 20-25 MG tablet Commonly known as: ZESTORETIC Take 1 tablet by mouth daily.   metFORMIN 500 MG tablet Commonly known as: GLUCOPHAGE Take 500 mg by mouth 2 (two) times daily with a meal.   methimazole 5 MG tablet Commonly known as: TAPAZOLE Take 10 mg by mouth daily. What changed: Another medication with the same name was removed. Continue taking this medication, and follow the directions you see here.   metoprolol tartrate 100 MG tablet Commonly known as: LOPRESSOR Take 100 mg by mouth 2 (two) times daily with a meal.   oxyCODONE-acetaminophen 5-325 MG tablet Commonly known as: PERCOCET/ROXICET Take 0.5-1 tablets by mouth every 6 (six) hours as needed for severe pain.   pioglitazone 15 MG  tablet Commonly known as: ACTOS Take 15 mg by mouth daily.   sulfamethoxazole-trimethoprim 800-160 MG tablet Commonly known as: BACTRIM DS Take 1 tablet by mouth 2 (two) times daily. X 3 days   vitamin B-12 500 MCG tablet Commonly known as: CYANOCOBALAMIN Take 500 mcg by mouth daily.            Durable Medical Equipment  (From admission, onward)         Start     Ordered   05/22/20 1415  DME Walker rolling  Once       Question Answer Comment  Walker: With 5 Inch Wheels   Patient needs a walker to treat with the following condition S/P TKR (total knee replacement) using cement, right      05/22/20 1415   05/22/20 1415  DME 3 n 1  Once        05/22/20  1415   05/22/20 1415  DME Bedside commode  Once       Question:  Patient needs a bedside commode to treat with the following condition  Answer:  S/P TKR (total knee replacement) using cement, right   05/22/20 1415          Diagnostic Studies: DG Knee 1-2 Views Right  Result Date: 05/22/2020 CLINICAL DATA:  Status post right knee replacement. EXAM: RIGHT KNEE - 1-2 VIEW COMPARISON:  CT 11/23/2019. FINDINGS: Total right knee replacement. Hardware intact. Anatomic alignment. No acute bony abnormality. Peripheral vascular calcification. IMPRESSION: 1. Total right knee replacement with anatomic alignment. 2. Peripheral vascular disease. Electronically Signed   By: Marcello Moores  Register   On: 05/22/2020 13:03    Disposition: Discharge disposition: 03-Skilled [place] information for follow-up providers    Duanne Guess, PA-C. Go on 06/05/2020.   Specialties: Orthopedic Surgery, Emergency Medicine Why: at 2:45pm Contact information: [place] [id number] [phone]            Contact information for after-discharge care    [facility] .   Service: Skilled Nursing Contact information: [place] [phone]                   Signed: Feliberto Gottron 05/29/2020, 9:46 AM

## 2020-05-24 NOTE — Progress Notes (Signed)
Physical Therapy Treatment Patient Details Name: Adriana Sanders MRN: [id number] DOB: 25-Dec-1940 Today's Date: 05/24/2020    History of Present Illness 80 y/o female s/p R TKA 05/22/20.    PT Comments    Pt with increased confusion upon arrival to the room today.  Nurse staff in room to assist with transferring pt to [facility] as patient was stating that she needed to have a bowel movement.  Pt with increased confusion this PM session, not knowing where she is currently.  Pt having much more difficulty grasping the walker and wanting to clutch at the hands of the therapist and nurse tech instead of the walker.  Pt able to perform transfer with modA +2 from therapist and nurse staff.  Pt able to utilize [facility] and then required assistance for cleaning.  Pt then required modA +2 for transfer back to bedside and into the bed.  Pt unable to ambulate with good confidence due to safety at this time.  Current discharge plans to SNF remain appropriate at this time.  Pt will continue to benefit from skilled therapy in order to address deficits listed below.    Follow Up Recommendations  SNF     Equipment Recommendations  None recommended by PT    Recommendations for Other Services       Precautions / Restrictions Precautions Precautions: Fall Restrictions Weight Bearing Restrictions: Yes RLE Weight Bearing: Weight bearing as tolerated    Mobility  Bed Mobility Overal bed mobility: Needs Assistance Bed Mobility: Supine to Sit     Supine to sit: Mod assist     General bed mobility comments: Pt requires assistance from therapist via verbal and tactile cues for hand placement on rails to come into sitting, and modA for R LE to come to EOB.    Transfers Overall transfer level: Needs assistance Equipment used: Rolling walker (2 wheeled) Transfers: Sit to/from [facility] Sit to Stand: Mod assist;+2 physical assistance;Min assist         General transfer comment: Pt required  min-modA from therapist and NT for assistance to come upright into standing position and to perform stand pivot transfer to [facility].  Ambulation/Gait Ambulation/Gait assistance: Min guard;Min assist           General Gait Details: Gait declined due to safety concerns due to mentation/confusion.   Stairs             Wheelchair Mobility    Modified Rankin (Stroke Patients Only)       Balance Overall balance assessment: Needs assistance Sitting-balance support: Bilateral upper extremity supported Sitting balance-Leahy Scale: Fair Sitting balance - Comments: pt with increased posterior lean during treatment session this PM.   Standing balance support: Bilateral upper extremity supported Standing balance-Leahy Scale: Poor Standing balance comment: noted to heavily rely on RW for UE support to offset weight from LEs 2/2 pain, at times needs tactile assistance to navigate walker to perform stand pivot transfer.                            Cognition Arousal/Alertness: Suspect due to medications Behavior During Therapy: WFL for tasks assessed/performed Overall Cognitive Status: No family/caregiver present to determine baseline cognitive functioning                                 General Comments: Pt confused on location of where she is and who she  is seeing.  Pt under the impressino that she is in her nurses home.      Exercises      General Comments General comments (skin integrity, edema, etc.): Pt is much more confused this PM than around lunch time.  Pt unable to answer questions and believes she is in the home of her nurse, not in a hospital.      Pertinent Vitals/Pain Pain Assessment: Faces Faces Pain Scale: Hurts even more Pain Location: R knee Pain Descriptors / Indicators: Aching;Grimacing;Guarding Pain Intervention(s): Limited activity within patient's tolerance;Monitored during session;Premedicated before session;Ice applied    Home  Living                      Prior Function            PT Goals (current goals can now be found in the care plan section) Acute Rehab PT Goals Patient Stated Goal: get back to walking w/o AD PT Goal Formulation: With patient Time For Goal Achievement: 06/05/20 Potential to Achieve Goals: Fair Progress towards PT goals: Not progressing toward goals - comment (Pt unable to progress due to mentation/confusion.)    Frequency    BID      PT Plan Current plan remains appropriate    Co-evaluation              AM-PAC PT "6 Clicks" Mobility   Outcome Measure  Help needed turning from your back to your side while in a flat bed without using bedrails?: A Lot Help needed moving from lying on your back to sitting on the side of a flat bed without using bedrails?: A Lot Help needed moving to and from a bed to a chair (including a wheelchair)?: A Lot Help needed standing up from a chair using your arms (e.g., wheelchair or bedside chair)?: A Lot Help needed to walk in hospital room?: A Lot Help needed climbing 3-5 steps with a railing? : Total 6 Click Score: 11    End of Session Equipment Utilized During Treatment: Gait belt Activity Tolerance: Patient limited by pain;Patient tolerated treatment well Patient left: with nursing/sitter in room;in bed;with call bell/phone within reach;with bed alarm set Nurse Communication: Mobility status PT Visit Diagnosis: Muscle weakness (generalized) (M62.81);Difficulty in walking, not elsewhere classified (R26.2);Pain Pain - Right/Left: Right Pain - part of body: Knee     Time: 8295-6213 PT Time Calculation (min) (ACUTE ONLY): 23 min  Charges:  $Therapeutic Exercise: 8-22 mins $Self Care/Home Management: 23-37                     Gwenlyn Saran, PT, DPT 05/24/20, 5:50 PM

## 2020-05-24 NOTE — Progress Notes (Signed)
   Subjective: 2 Days Post-Op Procedure(s) (LRB): TOTAL KNEE ARTHROPLASTY - Rachelle Hora to Assist (Right) Patient reports pain as mild. Confused this am. Confusion started after receiving narcotics last night. Patient is well, and has had no acute complaints or problems Denies any CP, SOB, ABD pain. We will continue therapy today.  Plan is to go Skilled nursing facility after hospital stay.  Objective: Vital signs in last 24 hours: Temp:  [98.5 F (36.9 C)-98.9 F (37.2 C)] 98.5 F (36.9 C) (03/24 0437) Pulse Rate:  [54-78] 70 (03/24 0437) Resp:  [16] 16 (03/24 0437) BP: (88-139)/(51-73) 139/68 (03/24 0437) SpO2:  [91 %-98 %] 98 % (03/24 0437)  Intake/Output from previous day: 03/23 0701 - 03/24 0700 In: 240 [P.O.:240] Out: 1050 [Urine:800; Drains:250] Intake/Output this shift: No intake/output data recorded.  Recent Labs    05/23/20 0544 05/24/20 0448  HGB 10.5* 9.6*   Recent Labs    05/23/20 0544 05/24/20 0448  WBC 7.2 7.2  RBC 3.55* 3.28*  HCT 30.8* 28.4*  PLT 175 145*   Recent Labs    05/23/20 0544 05/24/20 0448  NA 128* 133*  K 3.8 3.8  CL 99 101  CO2 22 22  BUN 20 16  CREATININE 1.40* 1.04*  GLUCOSE 137* 108*  CALCIUM 8.8* 9.4   No results for input(s): LABPT, INR in the last 72 hours.  EXAM General - Patient is Alert, Appropriate and Oriented Extremity - Neurovascular intact Sensation intact distally Intact pulses distally No cellulitis present Compartment soft  Neuro - no deficits Dressing - dressing C/D/I and no drainage, prevena intact with 150cc drainage Motor Function - intact, moving foot and toes well on exam.   Past Medical History:  Diagnosis Date  . Anxiety   . Arthritis    both knees  . Cancer ([place])    basal cell of skin, thyroid cancer.  had radiation  . Depression   . Diabetes mellitus without complication ([place])   . Dysrhythmia    skips a beat  . Hyperlipemia   . Hypertension   . Hyperthyroidism   . Neuromuscular  disorder ([place])   . Panic disorder   . Parkinsonism ([place])   . Stroke ([place]) 2001    Assessment/Plan:   2 Days Post-Op Procedure(s) (LRB): TOTAL KNEE ARTHROPLASTY - Rachelle Hora to Assist (Right) Active Problems:   S/P TKR (total knee replacement) using cement, right  Estimated body mass index is 29.76 kg/m as calculated from the following:   Height as of this encounter: 5\' 3"  (1.6 m).   Weight as of this encounter: 76.2 kg. Advance diet Up with therapy  VSS, BP improved Pain well controlled Altered mental status - likely medication induced. Will hold narcotics today. Check UA. No neuro deficits. Continue to monitor Acute renal insufficiency - Cr back to baseline CM to assist with discharge to SNF   DVT Prophylaxis - Aspirin, TED hose and aggrenox, SCDs Weight-Bearing as tolerated to right leg   T. Rachelle Hora, PA-C [place] 05/24/2020, 7:26 AM

## 2020-05-25 LAB — CBC
HCT: 28 % — ABNORMAL LOW (ref 36.0–46.0)
Hemoglobin: 9.6 g/dL — ABNORMAL LOW (ref 12.0–15.0)
MCH: 29.6 pg (ref 26.0–34.0)
MCHC: 34.3 g/dL (ref 30.0–36.0)
MCV: 86.4 fL (ref 80.0–100.0)
Platelets: 163 10*3/uL (ref 150–400)
RBC: 3.24 MIL/uL — ABNORMAL LOW (ref 3.87–5.11)
RDW: 14.3 % (ref 11.5–15.5)
WBC: 6.8 10*3/uL (ref 4.0–10.5)
nRBC: 0 % (ref 0.0–0.2)

## 2020-05-25 LAB — BASIC METABOLIC PANEL
Anion gap: 8 (ref 5–15)
BUN: 11 mg/dL (ref 8–23)
CO2: 24 mmol/L (ref 22–32)
Calcium: 9.6 mg/dL (ref 8.9–10.3)
Chloride: 99 mmol/L (ref 98–111)
Creatinine, Ser: 0.8 mg/dL (ref 0.44–1.00)
GFR, Estimated: >60 mL/min (ref >60)
Glucose, Bld: 94 mg/dL (ref 70–99)
Potassium: 4 mmol/L (ref 3.5–5.1)
Sodium: 131 mmol/L — ABNORMAL LOW (ref 135–145)

## 2020-05-25 LAB — URINE CULTURE: Culture: NO GROWTH

## 2020-05-25 LAB — GLUCOSE, CAPILLARY
Glucose-Capillary: 87 mg/dL (ref 70–99)
Glucose-Capillary: 114 mg/dL — ABNORMAL HIGH (ref 70–99)
Glucose-Capillary: 128 mg/dL — ABNORMAL HIGH (ref 70–99)
Glucose-Capillary: 116 mg/dL — ABNORMAL HIGH (ref 70–99)

## 2020-05-25 LAB — RESP PANEL BY RT-PCR (FLU A&B, COVID) ARPGX2
Influenza A by PCR: NEGATIVE
Influenza B by PCR: NEGATIVE
SARS Coronavirus 2 by RT PCR: NEGATIVE

## 2020-05-25 MED ORDER — OXYCODONE-ACETAMINOPHEN 5-325 MG PO TABS: 0.5000 | ORAL_TABLET | Freq: Four times a day (QID) | ORAL | 0 refills | Status: DC | PRN

## 2020-05-25 MED ORDER — OXYCODONE-ACETAMINOPHEN 5-325 MG PO TABS: 0.5000 | ORAL_TABLET | Freq: Four times a day (QID) | ORAL | Status: DC | PRN

## 2020-05-25 MED ORDER — ZOLPIDEM TARTRATE 5 MG PO TABS: 5.0000 mg | ORAL_TABLET | Freq: Every evening | ORAL | Status: DC | PRN

## 2020-05-25 NOTE — Progress Notes (Signed)
Physical Therapy Treatment Patient Details Name: Adriana Sanders MRN: [id number] DOB: 1940/08/07 Today's Date: 05/25/2020    History of Present Illness 80 y/o female s/p R TKA 05/22/20.    PT Comments    Patient received in bed, she reports she is feeling a bit better than yesterday. She is aware that she was confused yesterday due to pain medicine. Patient requires min assist for bed mobility and bed exercises. Patient transfers with min assist. She ambulated 10 feet with RW and min guard. Patient is limited by knee pain and fear with mobility. Patient will continue to benefit from skilled PT while here to improve functional mobility and strength.      Follow Up Recommendations  SNF;Supervision for mobility/OOB     Equipment Recommendations  None recommended by PT    Recommendations for Other Services       Precautions / Restrictions Precautions Precautions: Fall Restrictions Weight Bearing Restrictions: Yes RLE Weight Bearing: Weight bearing as tolerated    Mobility  Bed Mobility Overal bed mobility: Needs Assistance Bed Mobility: Supine to Sit     Supine to sit: Min assist     General bed mobility comments: Requires assist to bring R LE off bed. Min guard for raising trunk to seated position.    Transfers Overall transfer level: Needs assistance Equipment used: Rolling walker (2 wheeled) Transfers: Sit to/from Stand Sit to Stand: Min guard            Ambulation/Gait Ambulation/Gait assistance: Min guard Gait Distance (Feet): 10 Feet Assistive device: Rolling walker (2 wheeled) Gait Pattern/deviations: Step-to pattern;Decreased step length - right;Decreased step length - left;Decreased weight shift to right Gait velocity: decreased   General Gait Details: Patient declines ambulating further at this time. She is fearful and limited by pain.   Stairs             Wheelchair Mobility    Modified Rankin (Stroke Patients Only)       Balance  Overall balance assessment: Needs assistance Sitting-balance support: Feet supported Sitting balance-Leahy Scale: Good Sitting balance - Comments: patient able to sit unsupported at edge of bed   Standing balance support: Bilateral upper extremity supported;During functional activity Standing balance-Leahy Scale: Fair Standing balance comment: Reliance on B UE support and min guard for safety                            Cognition Arousal/Alertness: Awake/alert Behavior During Therapy: WFL for tasks assessed/performed Overall Cognitive Status: Impaired/Different from baseline Area of Impairment: Orientation;Awareness;Problem solving                 Orientation Level: Disoriented to;Situation         Awareness: Intellectual Problem Solving: Requires verbal cues;Requires tactile cues General Comments: Patient continues to be somewhat disoriented. Seems to be improved from yesterday.      Exercises Total Joint Exercises Ankle Circles/Pumps: AROM;10 reps;Both Heel Slides: AAROM;Right;10 reps Hip ABduction/ADduction: AAROM;Right;10 reps Straight Leg Raises: AAROM;Right;10 reps Long Arc Quad: AAROM;Right;5 reps Goniometric ROM: 90 in sitting    General Comments        Pertinent Vitals/Pain Pain Assessment: Faces Faces Pain Scale: Hurts whole lot Pain Location: R knee Pain Descriptors / Indicators: Aching;Grimacing;Guarding;Sore Pain Intervention(s): Limited activity within patient's tolerance;Monitored during session;Premedicated before session;Repositioned;Ice applied    Home Living                      Prior Function  PT Goals (current goals can now be found in the care plan section) Acute Rehab PT Goals Patient Stated Goal: get back to walking w/o AD PT Goal Formulation: With patient Time For Goal Achievement: 06/05/20 Potential to Achieve Goals: Good Progress towards PT goals: Progressing toward goals    Frequency     BID      PT Plan Current plan remains appropriate    Co-evaluation              AM-PAC PT "6 Clicks" Mobility   Outcome Measure  Help needed turning from your back to your side while in a flat bed without using bedrails?: A Little Help needed moving from lying on your back to sitting on the side of a flat bed without using bedrails?: A Lot Help needed moving to and from a bed to a chair (including a wheelchair)?: A Little Help needed standing up from a chair using your arms (e.g., wheelchair or bedside chair)?: A Little Help needed to walk in hospital room?: A Little Help needed climbing 3-5 steps with a railing? : Total 6 Click Score: 15    End of Session Equipment Utilized During Treatment: Gait belt Activity Tolerance: Patient limited by pain Patient left: in chair;with call bell/phone within reach;with chair alarm set Nurse Communication: Mobility status PT Visit Diagnosis: Muscle weakness (generalized) (M62.81);Difficulty in walking, not elsewhere classified (R26.2);Pain Pain - Right/Left: Right Pain - part of body: Knee     Time: 0940-1005 PT Time Calculation (min) (ACUTE ONLY): 25 min  Charges:  $Gait Training: 8-22 mins $Therapeutic Exercise: 8-22 mins                     Jolinda Pinkstaff, PT, GCS 05/25/20,10:25 AM

## 2020-05-25 NOTE — Progress Notes (Signed)
Physical Therapy Treatment Patient Details Name: Adriana Sanders MRN: [id number] DOB: 01-May-1940 Today's Date: 05/25/2020    History of Present Illness 80 y/o female s/p R TKA 05/22/20.    PT Comments    Patient received in bed, reports she needs to go to the bathroom again. (Patient having multiple BMs due to laxatives given) Patient assisted from supine to sit with min assist. Pivoted from bed to [facility] with mod assist. Patient declines further ambulation at this time due to fatigue and pain. She is assisted back into bed and LE strengthening/ROM exercises performed with assistance. Patient is making some progress today, cognition has improved. She is pain limited. Patient will continue to benefit from skilled PT while here to improve functional independence, strength and rom.     Follow Up Recommendations  SNF;Supervision for mobility/OOB     Equipment Recommendations  None recommended by PT    Recommendations for Other Services       Precautions / Restrictions Precautions Precautions: Fall Restrictions Weight Bearing Restrictions: Yes RLE Weight Bearing: Weight bearing as tolerated    Mobility  Bed Mobility Overal bed mobility: Needs Assistance Bed Mobility: Supine to Sit;Sit to Supine     Supine to sit: Min assist Sit to supine: Min assist   General bed mobility comments: Requires assist to bring R LE off bed. Min guard for raising trunk to seated position.    Transfers Overall transfer level: Needs assistance Equipment used: 1 person hand held assist Transfers: Stand Pivot Transfers;Sit to/from Stand Sit to Stand: Min assist Stand pivot transfers: Mod assist       General transfer comment: Patient requires min assist for sit to stand, mod assist for spt to [facility] and back to bed.  Ambulation/Gait             General Gait Details: Patient declines ambulating further at this time. She is fearful and limited by pain.   Stairs             Wheelchair  Mobility    Modified Rankin (Stroke Patients Only)       Balance Overall balance assessment: Needs assistance Sitting-balance support: Feet supported Sitting balance-Leahy Scale: Good Sitting balance - Comments: patient able to sit unsupported at edge of bed   Standing balance support: Bilateral upper extremity supported;During functional activity Standing balance-Leahy Scale: Fair Standing balance comment: Reliance on B UE support and min guard for safety                            Cognition Arousal/Alertness: Awake/alert Behavior During Therapy: WFL for tasks assessed/performed Overall Cognitive Status: Impaired/Different from baseline Area of Impairment: Awareness;Problem solving;Memory                     Memory: Decreased short-term memory     Awareness: Intellectual Problem Solving: Requires verbal cues;Requires tactile cues General Comments: Cognition appears to be improving      Exercises Total Joint Exercises Ankle Circles/Pumps: AROM;10 reps;Both Heel Slides: AAROM;Right;10 reps Hip ABduction/ADduction: AAROM;Right;10 reps Straight Leg Raises: AAROM;Right;10 reps Long Arc Quad: AAROM;Right;5 reps Goniometric ROM: 90 in sitting    General Comments        Pertinent Vitals/Pain Pain Assessment: Faces Faces Pain Scale: Hurts even more Pain Location: R knee Pain Descriptors / Indicators: Guarding;Grimacing;Sore Pain Intervention(s): Limited activity within patient's tolerance;Monitored during session;Ice applied;Repositioned    Home Living  Prior Function            PT Goals (current goals can now be found in the care plan section) Acute Rehab PT Goals Patient Stated Goal: get back to walking w/o AD PT Goal Formulation: With patient Time For Goal Achievement: 06/05/20 Potential to Achieve Goals: Good Progress towards PT goals: Progressing toward goals    Frequency    BID      PT Plan  Current plan remains appropriate    Co-evaluation              AM-PAC PT "6 Clicks" Mobility   Outcome Measure  Help needed turning from your back to your side while in a flat bed without using bedrails?: A Little Help needed moving from lying on your back to sitting on the side of a flat bed without using bedrails?: A Lot Help needed moving to and from a bed to a chair (including a wheelchair)?: A Little Help needed standing up from a chair using your arms (e.g., wheelchair or bedside chair)?: A Little Help needed to walk in hospital room?: A Lot Help needed climbing 3-5 steps with a railing? : Total 6 Click Score: 14    End of Session Equipment Utilized During Treatment: Gait belt Activity Tolerance: Patient limited by pain;Patient limited by fatigue Patient left: in bed;with call bell/phone within reach;with bed alarm set Nurse Communication: Mobility status PT Visit Diagnosis: Muscle weakness (generalized) (M62.81);Difficulty in walking, not elsewhere classified (R26.2);Pain Pain - Right/Left: Right Pain - part of body: Knee     Time: 1340-1405 PT Time Calculation (min) (ACUTE ONLY): 25 min  Charges:  $Gait Training: 8-22 mins $Therapeutic Exercise: 8-22 mins $Therapeutic Activity: 8-22 mins                     Marceil Welp, PT, GCS 05/25/20,2:20 PM

## 2020-05-25 NOTE — TOC Progression Note (Signed)
Transition of Care [facility]) - Progression Note    Patient Details  Name: Adriana Sanders MRN: [id number] Date of Birth: 1940/05/03  Transition of Care [facility]) CM/SW [place], RN Phone Number: 05/25/2020, 3:16 PM  Clinical Narrative:  TOC in to see patient at bedside re: discharge planning.  Discussed SNF placement, patient amenable.  Bed offers from [employer].  Patient unsure of which facility to choose, asked TOC to contact her daughter.  TOC made 3 attempts to contact patient's daughter, Adriana Sanders and received a disconnection twice.  TOC left a voice mail for daughter to call back.  Patient lives at home and has no concerns about getting to appointments or getting medications/supplies.  States daughter assists her.  No further questions/concerns voiced, TOC contact information given to patient.  TOC will follow until discharge.     Expected Discharge Plan: [place] Barriers to Discharge: Continued Medical Work up  Expected Discharge Plan and Services Expected Discharge Plan: [place] Choice: [place] arrangements for the past 2 months: Single Family Home Expected Discharge Date: 05/25/20                                     Social Determinants of Health (SDOH) Interventions    Readmission Risk Interventions No flowsheet data found.

## 2020-05-25 NOTE — Care Management Important Message (Signed)
Important Message  Patient Details  Name: Adriana Sanders MRN: [id number] Date of Birth: August 25, 1940   Medicare Important Message Given:  N/A - LOS <3 / Initial given by admissions     [name] 05/25/2020, 8:40 AM

## 2020-05-25 NOTE — Progress Notes (Signed)
   Subjective: 3 Days Post-Op Procedure(s) (LRB): TOTAL KNEE ARTHROPLASTY - Rachelle Hora to Assist (Right) Patient reports pain as mild. No confusion this am. Patient is well, and has had no acute complaints or problems Denies any CP, SOB, ABD pain. We will continue therapy today.  Plan is to go Skilled nursing facility after hospital stay.  Objective: Vital signs in last 24 hours: Temp:  [98.3 F (36.8 C)-99.1 F (37.3 C)] 99.1 F (37.3 C) (03/25 0734) Pulse Rate:  [70-79] 79 (03/25 0734) Resp:  [15-18] 16 (03/25 0734) BP: (126-151)/(52-69) 139/64 (03/25 0734) SpO2:  [83 %-99 %] 96 % (03/25 0734)  Intake/Output from previous day: 03/24 0701 - 03/25 0700 In: 0  Out: 540 [Urine:540] Intake/Output this shift: No intake/output data recorded.  Recent Labs    05/23/20 0544 05/24/20 0448 05/25/20 0547  HGB 10.5* 9.6* 9.6*   Recent Labs    05/24/20 0448 05/25/20 0547  WBC 7.2 6.8  RBC 3.28* 3.24*  HCT 28.4* 28.0*  PLT 145* 163   Recent Labs    05/24/20 0448 05/25/20 0547  NA 133* 131*  K 3.8 4.0  CL 101 99  CO2 22 24  BUN 16 11  CREATININE 1.04* 0.80  GLUCOSE 108* 94  CALCIUM 9.4 9.6   No results for input(s): LABPT, INR in the last 72 hours.  EXAM General - Patient is Alert, Appropriate and Oriented Extremity - Neurovascular intact Sensation intact distally Intact pulses distally No cellulitis present Compartment soft  Neuro - no deficits Dressing - dressing C/D/I and no drainage, prevena intact with 150cc drainage Motor Function - intact, moving foot and toes well on exam.   Past Medical History:  Diagnosis Date  . Anxiety   . Arthritis    both knees  . Cancer ([place])    basal cell of skin, thyroid cancer.  had radiation  . Depression   . Diabetes mellitus without complication ([place])   . Dysrhythmia    skips a beat  . Hyperlipemia   . Hypertension   . Hyperthyroidism   . Neuromuscular disorder ([place])   . Panic disorder   . Parkinsonism  ([place])   . Stroke ([place]) 2001    Assessment/Plan:   3 Days Post-Op Procedure(s) (LRB): TOTAL KNEE ARTHROPLASTY - Rachelle Hora to Assist (Right) Active Problems:   S/P TKR (total knee replacement) using cement, right  Estimated body mass index is 29.76 kg/m as calculated from the following:   Height as of this encounter: 5\' 3"  (1.6 m).   Weight as of this encounter: 76.2 kg. Advance diet Up with therapy  VSS, BP improved Pain well controlled Altered mental status - likely medication induced. Resolved Acute renal insufficiency - Cr back to baseline CM to assist with discharge to SNF today. Covid test ordered  DVT Prophylaxis - Aspirin, TED hose and aggrenox, SCDs Weight-Bearing as tolerated to right leg   T. Rachelle Hora, PA-C [place] 05/25/2020, 7:39 AM

## 2020-05-26 LAB — GLUCOSE, CAPILLARY
Glucose-Capillary: 114 mg/dL — ABNORMAL HIGH (ref 70–99)
Glucose-Capillary: 149 mg/dL — ABNORMAL HIGH (ref 70–99)
Glucose-Capillary: 103 mg/dL — ABNORMAL HIGH (ref 70–99)
Glucose-Capillary: 137 mg/dL — ABNORMAL HIGH (ref 70–99)

## 2020-05-26 NOTE — Progress Notes (Signed)
   Subjective: 4 Days Post-Op Procedure(s) (LRB): TOTAL KNEE ARTHROPLASTY - Rachelle Hora to Assist (Right) Patient reports pain as mild. No confusion this am.  Feeling better. Patient is well, and has had no acute complaints or problems Denies any CP, SOB, ABD pain. We will continue therapy today.  Plan is to go Skilled nursing facility after hospital stay.  Objective: Vital signs in last 24 hours: Temp:  [97.7 F (36.5 C)-99.1 F (37.3 C)] 97.8 F (36.6 C) (03/26 0543) Pulse Rate:  [53-110] 95 (03/26 0543) Resp:  [15-18] 16 (03/26 0543) BP: (109-139)/(59-74) 109/59 (03/26 0543) SpO2:  [93 %-100 %] 96 % (03/26 0543)  Intake/Output from previous day: 03/25 0701 - 03/26 0700 In: -  Out: 100 [Urine:100] Intake/Output this shift: No intake/output data recorded.  Recent Labs    05/24/20 0448 05/25/20 0547  HGB 9.6* 9.6*   Recent Labs    05/24/20 0448 05/25/20 0547  WBC 7.2 6.8  RBC 3.28* 3.24*  HCT 28.4* 28.0*  PLT 145* 163   Recent Labs    05/24/20 0448 05/25/20 0547  NA 133* 131*  K 3.8 4.0  CL 101 99  CO2 22 24  BUN 16 11  CREATININE 1.04* 0.80  GLUCOSE 108* 94  CALCIUM 9.4 9.6   No results for input(s): LABPT, INR in the last 72 hours.  EXAM General - Patient is Alert, Appropriate and Oriented Extremity - Neurovascular intact Sensation intact distally Intact pulses distally No cellulitis present Compartment soft  Neuro - no deficits Dressing - dressing C/D/I and no drainage, prevena intact with 150cc drainage.  Minimal drainage. Motor Function - intact, moving foot and toes well on exam.   Past Medical History:  Diagnosis Date  . Anxiety   . Arthritis    both knees  . Cancer ([place])    basal cell of skin, thyroid cancer.  had radiation  . Depression   . Diabetes mellitus without complication ([place])   . Dysrhythmia    skips a beat  . Hyperlipemia   . Hypertension   . Hyperthyroidism   . Neuromuscular disorder ([place])   . Panic disorder   .  Parkinsonism ([place])   . Stroke ([place]) 2001    Assessment/Plan:   4 Days Post-Op Procedure(s) (LRB): TOTAL KNEE ARTHROPLASTY - Rachelle Hora to Assist (Right) Active Problems:   S/P TKR (total knee replacement) using cement, right  Estimated body mass index is 29.76 kg/m as calculated from the following:   Height as of this encounter: 5\' 3"  (1.6 m).   Weight as of this encounter: 76.2 kg. Advance diet Up with therapy  VSS, BP improved  Positive bowel movement. Pain well controlled Altered mental status - likely medication induced. Resolved Acute renal insufficiency - Cr back to baseline CM to assist with discharge to SNF today. Covid test negative  DVT Prophylaxis - Aspirin, TED hose and aggrenox, SCDs Weight-Bearing as tolerated to right leg   Reche Dixon, PA-C [place] 05/26/2020, 7:22 AM

## 2020-05-26 NOTE — Progress Notes (Signed)
Physical Therapy Treatment Patient Details Name: Adriana Sanders MRN: [id number] DOB: October 06, 1940 Today's Date: 05/26/2020    History of Present Illness 80 y/o female s/p R TKA 05/22/20.    PT Comments    Pt laying in bed as pleasant as every, but clearly uncomfortable and in a lot of pain.  Issue has been pain meds causing confusion, so she has had much less today with predictable increase in pain levels. Pt very pleasantly refuses attempts at standing/walking today 2/2 pain.  She did show good effort with exercises but needed a lot of extra encouragement and rest breaks between reps secondary to pain.  Pt unable to do against gravity tasks very well today, but again, showed good effort despite clearly being very uncomfortable.   Follow Up Recommendations  SNF;Supervision for mobility/OOB     Equipment Recommendations  None recommended by PT    Recommendations for Other Services       Precautions / Restrictions Precautions Precautions: Fall Restrictions RLE Weight Bearing: Weight bearing as tolerated    Mobility  Bed Mobility               General bed mobility comments: Pt requesting not to get up this date, hurting too much and not wanting to wear herself out before going to rehab    Transfers                    Ambulation/Gait                 Stairs             Wheelchair Mobility    Modified Rankin (Stroke Patients Only)       Balance                                            Cognition Arousal/Alertness: Awake/alert Behavior During Therapy: WFL for tasks assessed/performed;Anxious Overall Cognitive Status: Within Functional Limits for tasks assessed                                        Exercises Total Joint Exercises Ankle Circles/Pumps: AROM;Both;15 reps Quad Sets: Strengthening;20 reps Short Arc Quad: AAROM;15 reps (pt struggled with against gravity effort today, pain limited) Heel  Slides: AAROM;10 reps Hip ABduction/ADduction: AROM;10 reps Straight Leg Raises: AAROM;10 reps (able to lift against gravity at all on her own) Knee Flexion: PROM;5 reps Goniometric ROM: 0-93 (significant pain nearing 90*)    General Comments        Pertinent Vitals/Pain Pain Assessment: 0-10 Pain Score: 8  Pain Location: R knee    Home Living                      Prior Function            PT Goals (current goals can now be found in the care plan section) Progress towards PT goals: Not progressing toward goals - comment (slow progress, more clear of mind today but hurting exquistly)    Frequency    BID      PT Plan Current plan remains appropriate    Co-evaluation              AM-PAC PT "6 Clicks" Mobility   Outcome Measure  Help needed  turning from your back to your side while in a flat bed without using bedrails?: A Little Help needed moving from lying on your back to sitting on the side of a flat bed without using bedrails?: A Lot Help needed moving to and from a bed to a chair (including a wheelchair)?: A Lot Help needed standing up from a chair using your arms (e.g., wheelchair or bedside chair)?: A Little Help needed to walk in hospital room?: A Lot Help needed climbing 3-5 steps with a railing? : Total 6 Click Score: 13    End of Session Equipment Utilized During Treatment: Gait belt Activity Tolerance: Patient limited by pain;Patient limited by fatigue Patient left: in bed;with call bell/phone within reach;with bed alarm set Nurse Communication: Mobility status PT Visit Diagnosis: Muscle weakness (generalized) (M62.81);Difficulty in walking, not elsewhere classified (R26.2);Pain Pain - Right/Left: Right Pain - part of body: Knee     Time: 1100-1126 PT Time Calculation (min) (ACUTE ONLY): 26 min  Charges:  $Therapeutic Exercise: 23-37 mins                     Kreg Shropshire, DPT 05/26/2020, 2:04 PM

## 2020-05-27 LAB — GLUCOSE, CAPILLARY
Glucose-Capillary: 136 mg/dL — ABNORMAL HIGH (ref 70–99)
Glucose-Capillary: 123 mg/dL — ABNORMAL HIGH (ref 70–99)
Glucose-Capillary: 106 mg/dL — ABNORMAL HIGH (ref 70–99)
Glucose-Capillary: 122 mg/dL — ABNORMAL HIGH (ref 70–99)

## 2020-05-27 MED ORDER — LOPERAMIDE HCL 2 MG PO CAPS
2.0000 mg | ORAL_CAPSULE | ORAL | Status: DC | PRN
  Administered 2020-05-27: 2 mg via ORAL
  Filled 2020-05-27 (×2): qty 1

## 2020-05-27 NOTE — Progress Notes (Signed)
Physical Therapy Treatment Patient Details Name: Adriana Sanders MRN: [id number] DOB: [birth year] Today's Date: 05/27/2020    History of Present Illness 80 y/o female s/p R TKA 05/22/20.    PT Comments    Feeling better, denies pain at rest.  Participated in exercises as described below. To EOB with min a a 1.  Stood with min a x 1 and verbal cues.  She is able to progress gait 90' this am with slow but steady gait.  Encouragement but overall does well.  Remained in recliner after session.    SNF remains appropriate given continued limitations in mobility, strength and ROM.   Follow Up Recommendations  SNF;Supervision for mobility/OOB     Equipment Recommendations  None recommended by PT    Recommendations for Other Services       Precautions / Restrictions Precautions Precautions: Fall Restrictions Weight Bearing Restrictions: Yes RLE Weight Bearing: Weight bearing as tolerated    Mobility  Bed Mobility Overal bed mobility: Needs Assistance Bed Mobility: Supine to Sit     Supine to sit: Min assist     General bed mobility comments: min a for LE supoort    Transfers Overall transfer level: Needs assistance Equipment used: Rolling walker (2 wheeled) Transfers: Sit to/from Stand Sit to Stand: Min assist            Ambulation/Gait Ambulation/Gait assistance: Min guard;Min Web designer (Feet): 90 Feet Assistive device: Rolling walker (2 wheeled) Gait Pattern/deviations: Step-to pattern;Decreased step length - right;Decreased step length - left;Decreased weight shift to right Gait velocity: decreased   General Gait Details: significant improvement today, reamains limited by pain   Stairs             Wheelchair Mobility    Modified Rankin (Stroke Patients Only)       Balance Overall balance assessment: Needs assistance Sitting-balance support: Feet supported Sitting balance-Leahy Scale: Good     Standing balance support: Bilateral  upper extremity supported;During functional activity Standing balance-Leahy Scale: Fair Standing balance comment: Reliance on B UE support and min guard for safety                            Cognition Arousal/Alertness: Awake/alert Behavior During Therapy: WFL for tasks assessed/performed;Anxious Overall Cognitive Status: Within Functional Limits for tasks assessed                                        Exercises Total Joint Exercises Ankle Circles/Pumps: AROM;Both;15 reps Quad Sets: Strengthening;20 reps Short Arc Quad: AAROM;15 reps (pt struggled with against gravity effort today, pain limited) Heel Slides: AAROM;10 reps Hip ABduction/ADduction: AROM;10 reps Straight Leg Raises: AAROM;10 reps (able to lift against gravity at all on her own) Knee Flexion: PROM;5 reps Goniometric ROM: 0-80    General Comments        Pertinent Vitals/Pain Pain Assessment: Faces Faces Pain Scale: Hurts even more Pain Location: no pain at rest, increases with WB Pain Descriptors / Indicators: Guarding;Grimacing;Sore Pain Intervention(s): Limited activity within patient's tolerance;Monitored during session;Repositioned    Home Living                      Prior Function            PT Goals (current goals can now be found in the care plan section) Progress towards PT goals:  Progressing toward goals    Frequency    BID      PT Plan Current plan remains appropriate    Co-evaluation              AM-PAC PT "6 Clicks" Mobility   Outcome Measure  Help needed turning from your back to your side while in a flat bed without using bedrails?: A Little Help needed moving from lying on your back to sitting on the side of a flat bed without using bedrails?: A Little Help needed moving to and from a bed to a chair (including a wheelchair)?: A Lot Help needed standing up from a chair using your arms (e.g., wheelchair or bedside chair)?: A Little Help  needed to walk in hospital room?: A Little Help needed climbing 3-5 steps with a railing? : A Lot 6 Click Score: 16    End of Session Equipment Utilized During Treatment: Gait belt Activity Tolerance: Patient limited by pain;Patient limited by fatigue Patient left: in bed;with call bell/phone within reach;with bed alarm set Nurse Communication: Mobility status PT Visit Diagnosis: Muscle weakness (generalized) (M62.81);Difficulty in walking, not elsewhere classified (R26.2);Pain Pain - Right/Left: Right Pain - part of body: Knee     Time: 1005-1029 PT Time Calculation (min) (ACUTE ONLY): 24 min  Charges:  $Gait Training: 8-22 mins $Therapeutic Exercise: 8-22 mins                    Chesley Noon, PTA 05/27/20, 10:40 AM

## 2020-05-27 NOTE — TOC Progression Note (Addendum)
Transition of Care [facility]) - Progression Note    Patient Details  Name: Adriana Sanders MRN: [id number] Date of Birth: 1941/01/31  [facility] CM/SW Contact  Boris Sharper, LCSW Phone Number: 05/27/2020, 11:13 AM  Clinical Narrative:    CSW has attempted multiple times to get in touch with Tanya at [facility] for weekend admission and has not been successful.  4:19 Received a call from [place] [facility]- unable to take pt due to staffing and pt not being "worked up" should be able to take pt tomorrow    Expected Discharge Plan: [place] Barriers to Discharge: Continued Medical Work up  Expected Discharge Plan and Services Expected Discharge Plan: [place] Choice: [place] arrangements for the past 2 months: Single Family Home Expected Discharge Date: 05/26/20                                     Social Determinants of Health (SDOH) Interventions    Readmission Risk Interventions No flowsheet data found.

## 2020-05-27 NOTE — Progress Notes (Signed)
   Subjective: 5 Days Post-Op Procedure(s) (LRB): TOTAL KNEE ARTHROPLASTY - Rachelle Hora to Assist (Right) Patient reports pain as mild. No confusion this am.  Feeling better. Patient is well, and has had no acute complaints or problems Denies any CP, SOB, ABD pain. We will continue therapy today.  Plan is to go Skilled nursing facility after hospital stay.  Objective: Vital signs in last 24 hours: Temp:  [98 F (36.7 C)-98.8 F (37.1 C)] 98.2 F (36.8 C) (03/27 0445) Pulse Rate:  [54-95] 67 (03/27 0445) Resp:  [16] 16 (03/27 0445) BP: (111-134)/(35-63) 134/35 (03/27 0445) SpO2:  [96 %-98 %] 98 % (03/27 0445)  Intake/Output from previous day: 03/26 0701 - 03/27 0700 In: 814.2 [I.V.:814.2] Out: 1 [Stool:1] Intake/Output this shift: No intake/output data recorded.  Recent Labs    05/25/20 0547  HGB 9.6*   Recent Labs    05/25/20 0547  WBC 6.8  RBC 3.24*  HCT 28.0*  PLT 163   Recent Labs    05/25/20 0547  NA 131*  K 4.0  CL 99  CO2 24  BUN 11  CREATININE 0.80  GLUCOSE 94  CALCIUM 9.6   No results for input(s): LABPT, INR in the last 72 hours.  EXAM General - Patient is Alert, Appropriate and Oriented Extremity - Neurovascular intact Sensation intact distally Intact pulses distally No cellulitis present Compartment soft  Neuro - no deficits Dressing - dressing C/D/I and no drainage, prevena intact with 150cc drainage.  Minimal drainage. Motor Function - intact, moving foot and toes well on exam.   Past Medical History:  Diagnosis Date  . Anxiety   . Arthritis    both knees  . Cancer ([place])    basal cell of skin, thyroid cancer.  had radiation  . Depression   . Diabetes mellitus without complication ([place])   . Dysrhythmia    skips a beat  . Hyperlipemia   . Hypertension   . Hyperthyroidism   . Neuromuscular disorder ([place])   . Panic disorder   . Parkinsonism ([place])   . Stroke ([place]) 2001    Assessment/Plan:   5 Days Post-Op Procedure(s)  (LRB): TOTAL KNEE ARTHROPLASTY - Rachelle Hora to Assist (Right) Active Problems:   S/P TKR (total knee replacement) using cement, right  Estimated body mass index is 29.76 kg/m as calculated from the following:   Height as of this encounter: 5\' 3"  (1.6 m).   Weight as of this encounter: 76.2 kg. Advance diet Up with therapy  VSS, BP improved  Positive bowel movement. Pain well controlled Altered mental status - likely medication induced. Resolved Acute renal insufficiency - Cr back to baseline CM to assist with discharge to SNF today.  Awaiting skilled nursing facility to respond to requests for discharge.  Covid test negative  DVT Prophylaxis - Aspirin, TED hose and aggrenox, SCDs Weight-Bearing as tolerated to right leg   Reche Dixon, PA-C [place] 05/27/2020, 7:03 AM

## 2020-05-28 LAB — GLUCOSE, CAPILLARY
Glucose-Capillary: 137 mg/dL — ABNORMAL HIGH (ref 70–99)
Glucose-Capillary: 111 mg/dL — ABNORMAL HIGH (ref 70–99)
Glucose-Capillary: 215 mg/dL — ABNORMAL HIGH (ref 70–99)
Glucose-Capillary: 72 mg/dL (ref 70–99)

## 2020-05-28 NOTE — Progress Notes (Signed)
   Subjective: 6 Days Post-Op Procedure(s) (LRB): TOTAL KNEE ARTHROPLASTY - Rachelle Hora to Assist (Right) Patient reports pain as mild. Patient is well, and has had no acute complaints or problems Denies any CP, SOB, ABD pain. We will continue therapy today.  Plan is to go Skilled nursing facility after hospital stay.  Objective: Vital signs in last 24 hours: Temp:  [98 F (36.7 C)-99 F (37.2 C)] 98.1 F (36.7 C) (03/28 0509) Pulse Rate:  [59-79] 76 (03/28 0509) Resp:  [14-17] 17 (03/28 0509) BP: (115-131)/(66-79) 126/69 (03/28 0509) SpO2:  [96 %-98 %] 97 % (03/28 0509)  Intake/Output from previous day: 03/27 0701 - 03/28 0700 In: 120 [P.O.:120] Out: 2 [Urine:1; Stool:1] Intake/Output this shift: No intake/output data recorded.  No results for input(s): HGB in the last 72 hours. No results for input(s): WBC, RBC, HCT, PLT in the last 72 hours. No results for input(s): NA, K, CL, CO2, BUN, CREATININE, GLUCOSE, CALCIUM in the last 72 hours. No results for input(s): LABPT, INR in the last 72 hours.  EXAM General - Patient is Alert, Appropriate and Oriented Extremity - Neurovascular intact Sensation intact distally Intact pulses distally No cellulitis present Compartment soft  Neuro - no deficits Dressing - dressing C/D/I and no drainage, prevena intact Motor Function - intact, moving foot and toes well on exam.   Past Medical History:  Diagnosis Date  . Anxiety   . Arthritis    both knees  . Cancer ([place])    basal cell of skin, thyroid cancer.  had radiation  . Depression   . Diabetes mellitus without complication ([place])   . Dysrhythmia    skips a beat  . Hyperlipemia   . Hypertension   . Hyperthyroidism   . Neuromuscular disorder ([place])   . Panic disorder   . Parkinsonism ([place])   . Stroke ([place]) 2001    Assessment/Plan:   6 Days Post-Op Procedure(s) (LRB): TOTAL KNEE ARTHROPLASTY - Rachelle Hora to Assist (Right) Active Problems:   S/P TKR (total knee  replacement) using cement, right  Estimated body mass index is 29.76 kg/m as calculated from the following:   Height as of this encounter: 5\' 3"  (1.6 m).   Weight as of this encounter: 76.2 kg. Advance diet Up with therapy  VSS Pain well controlled Altered mental status -Resolved Acute renal insufficiency - Resolved CM to assist with discharge to SNF today.   DVT Prophylaxis - Aspirin, TED hose and aggrenox, SCDs Weight-Bearing as tolerated to right leg   T. Rachelle Hora, PA-C [place] 05/28/2020, 7:45 AM

## 2020-05-28 NOTE — Progress Notes (Signed)
Physical Therapy Treatment Patient Details Name: Adriana Sanders MRN: [id number] DOB: 01-Jan-1941 Today's Date: 05/28/2020    History of Present Illness 80 y/o female s/p R TKA 05/22/20.    PT Comments    Participated in exercises as described below.  She is able to walk in hallway with min a x 1 but progression of gait today is limited by increased pain.  She remains up in recliner after session with needs met.   Follow Up Recommendations  SNF;Supervision for mobility/OOB     Equipment Recommendations  None recommended by PT    Recommendations for Other Services       Precautions / Restrictions Precautions Precautions: Fall Restrictions Weight Bearing Restrictions: Yes RLE Weight Bearing: Weight bearing as tolerated    Mobility  Bed Mobility Overal bed mobility: Needs Assistance Bed Mobility: Supine to Sit     Supine to sit: Min assist          Transfers Overall transfer level: Needs assistance Equipment used: Rolling walker (2 wheeled) Transfers: Sit to/from Stand Sit to Stand: Min assist            Ambulation/Gait Ambulation/Gait assistance: Min guard;Min assist Gait Distance (Feet): 90 Feet Assistive device: Rolling walker (2 wheeled) Gait Pattern/deviations: Step-to pattern;Decreased step length - right;Decreased step length - left;Decreased weight shift to right Gait velocity: decreased   General Gait Details: pt with increased pain today, limiting gait progression.   Stairs             Wheelchair Mobility    Modified Rankin (Stroke Patients Only)       Balance Overall balance assessment: Needs assistance Sitting-balance support: Feet supported Sitting balance-Leahy Scale: Good     Standing balance support: Bilateral upper extremity supported;During functional activity Standing balance-Leahy Scale: Fair Standing balance comment: Reliance on B UE support and min guard for safety                            Cognition  Arousal/Alertness: Awake/alert Behavior During Therapy: WFL for tasks assessed/performed Overall Cognitive Status: Within Functional Limits for tasks assessed                                        Exercises Total Joint Exercises Ankle Circles/Pumps: AROM;Both;15 reps Quad Sets: Strengthening;20 reps Short Arc Quad: AAROM;15 reps (pt struggled with against gravity effort today, pain limited) Heel Slides: AAROM;10 reps Hip ABduction/ADduction: AROM;10 reps Straight Leg Raises: AAROM;10 reps (able to lift against gravity at all on her own) Knee Flexion: PROM;5 reps Goniometric ROM: 0-85 increased today limited by bed rail under bed    General Comments        Pertinent Vitals/Pain Pain Assessment: Faces Pain Score: 0-No pain Faces Pain Scale: Hurts even more Pain Location: no pain at rest, increases with WB/gait Pain Descriptors / Indicators: Guarding;Grimacing;Sore Pain Intervention(s): Limited activity within patient's tolerance;Monitored during session;Repositioned;Ice applied    Home Living                      Prior Function            PT Goals (current goals can now be found in the care plan section) Progress towards PT goals: Progressing toward goals    Frequency    BID      PT Plan Current plan remains appropriate  Co-evaluation              AM-PAC PT "6 Clicks" Mobility   Outcome Measure  Help needed turning from your back to your side while in a flat bed without using bedrails?: A Little Help needed moving from lying on your back to sitting on the side of a flat bed without using bedrails?: A Little Help needed moving to and from a bed to a chair (including a wheelchair)?: A Little Help needed standing up from a chair using your arms (e.g., wheelchair or bedside chair)?: A Little Help needed to walk in hospital room?: A Little Help needed climbing 3-5 steps with a railing? : A Lot 6 Click Score: 17    End of Session  Equipment Utilized During Treatment: Gait belt Activity Tolerance: Patient limited by pain;Patient limited by fatigue Patient left: in bed;with call bell/phone within reach;with bed alarm set Nurse Communication: Mobility status PT Visit Diagnosis: Muscle weakness (generalized) (M62.81);Difficulty in walking, not elsewhere classified (R26.2);Pain Pain - Right/Left: Right Pain - part of body: Knee     Time: 4315-4008 PT Time Calculation (min) (ACUTE ONLY): 24 min  Charges:  $Gait Training: 8-22 mins $Therapeutic Exercise: 8-22 mins                    Chesley Noon, PTA 05/28/20, 12:04 PM

## 2020-05-28 NOTE — Care Management Important Message (Signed)
Important Message  Patient Details  Name: Adriana Sanders MRN: [id number] Date of Birth: 06-18-1940   Medicare Important Message Given:  Yes     Juliann Pulse A Saul Fabiano 05/28/2020, 9:49 AM

## 2020-05-28 NOTE — TOC Progression Note (Addendum)
Transition of Care [facility]) - Progression Note    Patient Details  Name: Adriana Sanders MRN: [id number] Date of Birth: 12-04-1940  Transition of Care [facility]) CM/SW [place], RN Phone Number: 05/28/2020, 12:55 PM  Clinical Narrative:    Addendum:  Spoke with [place], as per Magda Paganini, patient has a bed tomorrow.  Spoke to daughter and patient, who are both amenable to plan. Patient daughter called, requested that patient go to [facility] and not to [facility].  [facility] notified, awaiting response.  TOC will follow up today.    Expected Discharge Plan: [place] Barriers to Discharge: Continued Medical Work up  Expected Discharge Plan and Services Expected Discharge Plan: [place] Choice: [place] arrangements for the past 2 months: Single Family Home Expected Discharge Date: 05/28/20                                     Social Determinants of Health (SDOH) Interventions    Readmission Risk Interventions No flowsheet data found.

## 2020-05-28 NOTE — Progress Notes (Signed)
Physical Therapy Treatment Patient Details Name: Adriana Sanders MRN: [id number] DOB: [birth year] Today's Date: 05/28/2020    History of Present Illness 80 y/o female s/p R TKA 05/22/20.    PT Comments    In and OOB with min a x 1 and able to progress gait 110' in hallway.  Pain better controlled this pm allowing for easier gait.  To bathroom to void before returning to bed.   Follow Up Recommendations  SNF;Supervision for mobility/OOB     Equipment Recommendations  None recommended by PT    Recommendations for Other Services       Precautions / Restrictions Precautions Precautions: Fall Restrictions Weight Bearing Restrictions: Yes RLE Weight Bearing: Weight bearing as tolerated    Mobility  Bed Mobility Overal bed mobility: Needs Assistance Bed Mobility: Supine to Sit;Sit to Supine     Supine to sit: Min assist Sit to supine: Independent   General bed mobility comments: min a for LE supoort    Transfers Overall transfer level: Needs assistance Equipment used: Rolling walker (2 wheeled) Transfers: Sit to/from Stand Sit to Stand: Min assist            Ambulation/Gait Ambulation/Gait assistance: Min guard Gait Distance (Feet): 110 Feet Assistive device: Rolling walker (2 wheeled) Gait Pattern/deviations: Step-to pattern;Decreased step length - right;Decreased step length - left;Decreased weight shift to right Gait velocity: decreased   General Gait Details: pain better controlled this pm allowing for easier gait.   Stairs             Wheelchair Mobility    Modified Rankin (Stroke Patients Only)       Balance Overall balance assessment: Needs assistance Sitting-balance support: Feet supported Sitting balance-Leahy Scale: Good     Standing balance support: Bilateral upper extremity supported;During functional activity Standing balance-Leahy Scale: Fair Standing balance comment: Reliance on B UE support and min guard for safety                             Cognition Arousal/Alertness: Awake/alert Behavior During Therapy: WFL for tasks assessed/performed Overall Cognitive Status: Within Functional Limits for tasks assessed                                        Exercises Total Joint Exercises Ankle Circles/Pumps: AROM;Both;15 reps Quad Sets: Strengthening;20 reps Short Arc Quad: AAROM;15 reps (pt struggled with against gravity effort today, pain limited) Heel Slides: AAROM;10 reps Hip ABduction/ADduction: AROM;10 reps Straight Leg Raises: AAROM;10 reps (able to lift against gravity at all on her own) Knee Flexion: PROM;5 reps Goniometric ROM: 0-85 increased today limited by bed rail under bed    General Comments        Pertinent Vitals/Pain Pain Assessment: Faces Pain Score: 0-No pain Faces Pain Scale: Hurts a little bit Pain Location: improved this pm with gait. Pain Descriptors / Indicators: Guarding;Grimacing;Sore Pain Intervention(s): Limited activity within patient's tolerance;Monitored during session;Repositioned;Ice applied    Home Living                      Prior Function            PT Goals (current goals can now be found in the care plan section) Progress towards PT goals: Progressing toward goals    Frequency    BID      PT Plan Current  plan remains appropriate    Co-evaluation              AM-PAC PT "6 Clicks" Mobility   Outcome Measure  Help needed turning from your back to your side while in a flat bed without using bedrails?: A Little Help needed moving from lying on your back to sitting on the side of a flat bed without using bedrails?: A Little Help needed moving to and from a bed to a chair (including a wheelchair)?: A Little Help needed standing up from a chair using your arms (e.g., wheelchair or bedside chair)?: A Little Help needed to walk in hospital room?: A Little Help needed climbing 3-5 steps with a railing? : A Lot 6  Click Score: 17    End of Session Equipment Utilized During Treatment: Gait belt Activity Tolerance: Patient limited by pain;Patient limited by fatigue Patient left: in bed;with call bell/phone within reach;with bed alarm set Nurse Communication: Mobility status PT Visit Diagnosis: Muscle weakness (generalized) (M62.81);Difficulty in walking, not elsewhere classified (R26.2);Pain Pain - Right/Left: Right Pain - part of body: Knee     Time: 1355-1415 PT Time Calculation (min) (ACUTE ONLY): 20 min  Charges:  $Gait Training: 8-22 mins $Therapeutic Exercise: 8-22 mins                    Chesley Noon, PTA 05/28/20, 3:02 PM

## 2020-05-29 LAB — RESP PANEL BY RT-PCR (FLU A&B, COVID) ARPGX2
Influenza A by PCR: NEGATIVE
Influenza B by PCR: NEGATIVE
SARS Coronavirus 2 by RT PCR: NEGATIVE

## 2020-05-29 LAB — GLUCOSE, CAPILLARY
Glucose-Capillary: 145 mg/dL — ABNORMAL HIGH (ref 70–99)
Glucose-Capillary: 96 mg/dL (ref 70–99)

## 2020-05-29 NOTE — TOC Transition Note (Signed)
Transition of Care [facility]) - CM/SW Discharge Note   Patient Details  Name: Adriana Sanders MRN: [id number] Date of Birth: 1941/02/02  Transition of Care [facility]) CM/SW Contact:  Adriana Pelt, RN Phone Number: 05/29/2020, 1:30 PM   Clinical Narrative:  COVID test Negative, patient being transported to [facility] today at [place] via [place].  Care team aware, going to room 505. Daughter Adriana Sanders advised and will meet patient at facility.  Patient and daughter have no further questions or concerns at this time.      Barriers to Discharge: Continued Medical Work up   Patient Goals and CMS Choice   CMS Medicare.gov Compare Post Acute Care list provided to:: Patient    Discharge Placement                       Discharge Plan and Services     Post Acute Care Choice: Skilled Nursing Facility                               Social Determinants of Health (SDOH) Interventions     Readmission Risk Interventions No flowsheet data found.

## 2020-05-29 NOTE — Progress Notes (Signed)
   Subjective: 7 Days Post-Op Procedure(s) (LRB): TOTAL KNEE ARTHROPLASTY - Rachelle Hora to Assist (Right) Patient reports pain as mild. Patient is well, and has had no acute complaints or problems Denies any CP, SOB, ABD pain. We will continue therapy today.  Plan is to go Skilled nursing facility after hospital stay.  Objective: Vital signs in last 24 hours: Temp:  [97.7 F (36.5 C)-98.8 F (37.1 C)] 98 F (36.7 C) (03/29 0906) Pulse Rate:  [57-70] 59 (03/29 0906) Resp:  [16-18] 18 (03/29 0906) BP: (118-159)/(55-67) 127/61 (03/29 0906) SpO2:  [95 %-98 %] 95 % (03/29 0906)  Intake/Output from previous day: 03/28 0701 - 03/29 0700 In: 120 [P.O.:120] Out: -  Intake/Output this shift: No intake/output data recorded.  No results for input(s): HGB in the last 72 hours. No results for input(s): WBC, RBC, HCT, PLT in the last 72 hours. No results for input(s): NA, K, CL, CO2, BUN, CREATININE, GLUCOSE, CALCIUM in the last 72 hours. No results for input(s): LABPT, INR in the last 72 hours.  EXAM General - Patient is Alert, Appropriate and Oriented Extremity - Neurovascular intact Sensation intact distally Intact pulses distally No cellulitis present Compartment soft  Neuro - no deficits Dressing - dressing C/D/I and no drainage, prevena intact Motor Function - intact, moving foot and toes well on exam.   Past Medical History:  Diagnosis Date  . Anxiety   . Arthritis    both knees  . Cancer ([place])    basal cell of skin, thyroid cancer.  had radiation  . Depression   . Diabetes mellitus without complication ([place])   . Dysrhythmia    skips a beat  . Hyperlipemia   . Hypertension   . Hyperthyroidism   . Neuromuscular disorder ([place])   . Panic disorder   . Parkinsonism ([place])   . Stroke ([place]) 2001    Assessment/Plan:   7 Days Post-Op Procedure(s) (LRB): TOTAL KNEE ARTHROPLASTY - Rachelle Hora to Assist (Right) Active Problems:   S/P TKR (total knee replacement) using  cement, right  Estimated body mass index is 29.76 kg/m as calculated from the following:   Height as of this encounter: 5\' 3"  (1.6 m).   Weight as of this encounter: 76.2 kg. Advance diet Up with therapy  VSS Pain well controlled Altered mental status -Resolved Acute renal insufficiency - Resolved CM to assist with discharge to SNF today.   DVT Prophylaxis - Aspirin, TED hose and aggrenox, SCDs Weight-Bearing as tolerated to right leg   T. Rachelle Hora, PA-C [place] 05/29/2020, 9:45 AM

## 2020-05-29 NOTE — Progress Notes (Signed)
Physical Therapy Treatment Patient Details Name: Adriana Sanders MRN: [id number] DOB: Jun 17, 1940 Today's Date: 05/29/2020    History of Present Illness Adriana Sanders is a 80 y/o female s/p Rt TKA 05/22/20 c Dr. Hessie Knows, WBAT post surgery.    PT Comments    Pt in bed upon entry with RN. Author assists pt to BR, then AMB on unit, distance near typical recent sessions, but still significantly limited by pain and fatigue of BUE. Practice with repeat STS transfers (6 in total in session). Pt requirs assist to perform LAQ due to extreme discomfort. Pain at rest is nil. Will continue to follow.   Follow Up Recommendations  SNF;Supervision for mobility/OOB     Equipment Recommendations  None recommended by PT    Recommendations for Other Services       Precautions / Restrictions Precautions Precautions: Fall;Knee Precaution Booklet Issued: Yes (comment) Restrictions RLE Weight Bearing: Weight bearing as tolerated    Mobility  Bed Mobility Overal bed mobility: Needs Assistance Bed Mobility: Supine to Sit;Sit to Supine     Supine to sit: Supervision;Min guard Sit to supine: Min guard   General bed mobility comments: max effort to bring surgical leg into bed from floor; unable to scoot self up in bed    Transfers Overall transfer level: Needs assistance Equipment used: Rolling walker (2 wheeled) Transfers: Sit to/from Stand Sit to Stand: Min guard;From elevated surface         General transfer comment: 1x3 from elevated EOB; heavy BUE support to achieve, easy acquisition of stnading balanace.  Ambulation/Gait Ambulation/Gait assistance: Min guard Gait Distance (Feet): 92 Feet Assistive device: Rolling walker (2 wheeled) Gait Pattern/deviations: Step-to pattern Gait velocity: 0.48m/s   General Gait Details: pain limiting tolerance, posture very upright without cues; heavy UE support on RW required; arms fatigue quickly   Stairs             Wheelchair  Mobility    Modified Rankin (Stroke Patients Only)       Balance                                            Cognition Arousal/Alertness: Awake/alert Behavior During Therapy: WFL for tasks assessed/performed Overall Cognitive Status: Within Functional Limits for tasks assessed                                        Exercises Total Joint Exercises Long Arc QuadSinclair Ship;Right;10 reps;Seated;Limitations Long [employer] Limitations: extreme discomfort, but [place] Curia provides modA and pt is able to perform in full available range. Goniometric ROM: 14-78 degrees Rt knee P/ROM    General Comments        Pertinent Vitals/Pain Pain Assessment: 0-10 Pain Score: 6  Pain Location: in weightbearing; 0/10 at rest in bed Pain Intervention(s): Limited activity within patient's tolerance;Monitored during session;Premedicated before session    Home Living                      Prior Function            PT Goals (current goals can now be found in the care plan section) Acute Rehab PT Goals Patient Stated Goal: get back to walking w/o AD PT Goal Formulation: With patient Time For Goal Achievement: 06/05/20 Potential  to Achieve Goals: Good Progress towards PT goals: Progressing toward goals    Frequency    7X/week      PT Plan Current plan remains appropriate;Frequency needs to be updated    Co-evaluation              AM-PAC PT "6 Clicks" Mobility   Outcome Measure  Help needed turning from your back to your side while in a flat bed without using bedrails?: A Little Help needed moving from lying on your back to sitting on the side of a flat bed without using bedrails?: A Little Help needed moving to and from a bed to a chair (including a wheelchair)?: A Little Help needed standing up from a chair using your arms (e.g., wheelchair or bedside chair)?: A Little Help needed to walk in hospital room?: A Little Help needed climbing 3-5  steps with a railing? : A Lot 6 Click Score: 17    End of Session Equipment Utilized During Treatment: Gait belt Activity Tolerance: Patient limited by pain;Patient limited by fatigue Patient left: in bed;with call bell/phone within reach;with bed alarm set Nurse Communication: Mobility status PT Visit Diagnosis: Muscle weakness (generalized) (M62.81);Difficulty in walking, not elsewhere classified (R26.2);Pain Pain - Right/Left: Right Pain - part of body: Knee     Time: 1024-1050 PT Time Calculation (min) (ACUTE ONLY): 26 min  Charges:  $Gait Training: 8-22 mins $Therapeutic Exercise: 8-22 mins                     11:07 AM, 05/29/20 Etta Grandchild, PT, DPT Physical Therapist - [facility]  [phone] ([place])    [place] 05/29/2020, 11:07 AM

## 2020-05-30 DIAGNOSIS — G9340 Encephalopathy, unspecified: Secondary | ICD-10-CM | POA: Insufficient documentation

## 2020-11-01 DIAGNOSIS — E079 Disorder of thyroid, unspecified: Secondary | ICD-10-CM | POA: Insufficient documentation

## 2021-06-20 ENCOUNTER — Other Ambulatory Visit: Payer: Self-pay | Admitting: Orthopedic Surgery

## 2021-07-08 ENCOUNTER — Other Ambulatory Visit: Payer: Self-pay

## 2021-07-08 ENCOUNTER — Encounter
Admission: RE | Admit: 2021-07-08 | Discharge: 2021-07-08 | Disposition: A | Discharge status: Home / Self Care | Payer: Medicare Other | Source: Ambulatory Visit | Attending: Orthopedic Surgery | Admitting: Orthopedic Surgery

## 2021-07-08 VITALS — BP 122/56 | HR 107 | Temp 97.9°F | Resp 20 | Ht 64.0 in | Wt 160.1 lb

## 2021-07-08 DIAGNOSIS — I639 Cerebral infarction, unspecified: Secondary | ICD-10-CM | POA: Diagnosis not present

## 2021-07-08 DIAGNOSIS — R8290 Unspecified abnormal findings in urine: Secondary | ICD-10-CM | POA: Diagnosis not present

## 2021-07-08 DIAGNOSIS — Z01818 Encounter for other preprocedural examination: Secondary | ICD-10-CM | POA: Diagnosis present

## 2021-07-08 DIAGNOSIS — Z01812 Encounter for preprocedural laboratory examination: Secondary | ICD-10-CM

## 2021-07-08 LAB — URINALYSIS, ROUTINE W REFLEX MICROSCOPIC
Bilirubin Urine: NEGATIVE
Glucose, UA: NEGATIVE mg/dL
Hgb urine dipstick: NEGATIVE
Ketones, ur: 5 mg/dL — AB
Nitrite: NEGATIVE
Protein, ur: NEGATIVE mg/dL
Specific Gravity, Urine: 1.021 (ref 1.005–1.030)
pH: 5 (ref 5.0–8.0)

## 2021-07-08 LAB — PROTIME-INR
INR: 1 (ref 0.8–1.2)
Prothrombin Time: 13.3 seconds (ref 11.4–15.2)

## 2021-07-08 LAB — TYPE AND SCREEN
ABO/RH(D): B NEG
Antibody Screen: NEGATIVE

## 2021-07-08 LAB — SURGICAL PCR SCREEN
MRSA, PCR: NEGATIVE
Staphylococcus aureus: POSITIVE — AB

## 2021-07-08 LAB — APTT: aPTT: 29 seconds (ref 24–36)

## 2021-07-08 NOTE — Patient Instructions (Addendum)
Your procedure is scheduled on: 07/18/2021  ?Report to the Registration Desk on the 1st floor of the [facility]. ?To find out your arrival time, please call [phone] between 1PM - 3PM on:  07/17/2021  ?If your arrival time is 6:00 am, do not arrive prior to that time as the [facility] entrance doors do not open until 6:00 am. ? ?REMEMBER: ?Instructions that are not followed completely may result in serious medical risk, up to and including death; or upon the discretion of your surgeon and anesthesiologist your surgery may need to be rescheduled. ? ?Do not eat food after midnight the night before surgery.  ?No gum chewing, lozengers or hard candies. ? ?You may however, drink water up to 2 hours before you are scheduled to arrive for your surgery. Do not drink anything within 2 hours of your scheduled arrival time. ? ?Type 1 and Type 2 diabetics should only drink water. ? ?In addition, your doctor has ordered for you to drink the provided  ?Gatorade G2 ?Drinking this carbohydrate drink up to two hours before surgery helps to reduce insulin resistance and improve patient outcomes. Please complete drinking 2 hours prior to scheduled arrival time. ? ?TAKE THESE MEDICATIONS THE MORNING OF SURGERY WITH A SIP OF WATER: ?Xanax ?Lipitor ?Sinemet ?Prozac ?methimazole (TAPAZOLE) ?Metoprolol  ? ? ?Do not take metformin on 5/16,5/17/, 5/18  ? ? ?**Follow new guidelines for insulin and diabetes medications.** ? ?Follow recommendations from Cardiologist, Pulmonologist or PCP regarding stopping Aggrenox.  You were instructed by the Pre-admission RN to call the surgeon's office  for recommendation regarding stopping the  aggrenox.  Please remember the date of last dose. Your Pre-op RN will ask you this.  ? ?One week prior to surgery: ?Stop Anti-inflammatories (NSAIDS) such as Advil, Aleve, Ibuprofen, Motrin, Naproxen, Naprosyn and Aspirin based products such as Excedrin, Goodys Powder, BC Powder. ? ?Stop ANY OVER THE COUNTER  supplements until after surgery like, B-12, and Cholecalciferol ?You may however, continue to take Tylenol if needed for pain up until the day of surgery. ? ?No Alcohol for 24 hours before or after surgery. ? ?No Smoking including e-cigarettes for 24 hours prior to surgery.  ?No chewable tobacco products for at least 6 hours prior to surgery.  ?No nicotine patches on the day of surgery. ? ?Do not use any "recreational" drugs for at least a week prior to your surgery.  ?Please be advised that the combination of cocaine and anesthesia may have negative outcomes, up to and including death. ?If you test positive for cocaine, your surgery will be cancelled. ? ?On the morning of surgery brush your teeth with toothpaste and water, you may rinse your mouth with mouthwash if you wish. ?Do not swallow any toothpaste or mouthwash. ? ?Use CHG Soap with shower as directed on instruction sheet-provided for you  ? ?Do not wear jewelry, make-up, hairpins, clips or nail polish. ? ?Do not wear lotions, powders, or perfumes.  ? ?Do not shave body from the neck down 48 hours prior to surgery just in case you cut yourself which could leave a site for infection.  ?Also, freshly shaved skin may become irritated if using the CHG soap. ? ?Contact lenses, hearing aids and dentures may not be worn into surgery. ? ?Do not bring valuables to the hospital. [facility] is not responsible for any missing/lost belongings or valuables.  ? ? ?Notify your doctor if there is any change in your medical condition (cold, fever, infection). ? ?Wear  comfortable clothing (specific to your surgery type) to the hospital. ? ?After surgery, you can help prevent lung complications by doing breathing exercises.  ?Take deep breaths and cough every 1-2 hours. Your doctor may order a device called an Incentive Spirometer to help you take deep breaths. ?When coughing or sneezing, hold a pillow firmly against your incision with both hands. This is called ?splinting.?  Doing this helps protect your incision. It also decreases belly discomfort. ? ?If you are being admitted to the hospital overnight, leave your suitcase in the car. ?After surgery it may be brought to your room. ? ?If you are being discharged the day of surgery, you will not be allowed to drive home. ?You will need a responsible adult (18 years or older) to drive you home and stay with you that night.  ? ?If you are taking public transportation, you will need to have a responsible adult (18 years or older) with you. ?Please confirm with your physician that it is acceptable to use public transportation.  ? ?Please call the [facility] Dept. at [phone] if you have any questions about these instructions. ? ?Surgery Visitation Policy: ? ?Patients undergoing a surgery or procedure may have two family members or support persons with them as long as the person is not COVID-19 positive or experiencing its symptoms.  ? ?Inpatient Visitation:   ? ?Visiting hours are 7 a.m. to 8 p.m. ?Up to four visitors are allowed at one time in a patient room, including children. The visitors may rotate out with other people during the day. One designated support person (adult) may remain overnight.  ?

## 2021-07-09 LAB — URINE CULTURE: Culture: <10000 — AB

## 2021-07-18 ENCOUNTER — Observation Stay: Payer: Medicare Other

## 2021-07-18 ENCOUNTER — Ambulatory Visit: Payer: Medicare Other | Admitting: Anesthesiology

## 2021-07-18 ENCOUNTER — Ambulatory Visit: Payer: Medicare Other | Admitting: Urgent Care

## 2021-07-18 ENCOUNTER — Encounter: Payer: Self-pay | Admitting: Orthopedic Surgery

## 2021-07-18 ENCOUNTER — Encounter
Admission: RE | Disposition: A | Discharge status: Skilled Nursing Facility | Payer: Self-pay | Source: Home / Self Care | Attending: Orthopedic Surgery

## 2021-07-18 ENCOUNTER — Other Ambulatory Visit: Payer: Self-pay

## 2021-07-18 ENCOUNTER — Inpatient Hospital Stay
Admission: RE | Admit: 2021-07-18 | Discharge: 2021-07-23 | DRG: 470 | Disposition: A | Discharge status: Skilled Nursing Facility | Payer: Medicare Other | Attending: Orthopedic Surgery | Admitting: Orthopedic Surgery

## 2021-07-18 DIAGNOSIS — F410 Panic disorder [episodic paroxysmal anxiety] without agoraphobia: Secondary | ICD-10-CM | POA: Diagnosis present

## 2021-07-18 DIAGNOSIS — Z01818 Encounter for other preprocedural examination: Secondary | ICD-10-CM

## 2021-07-18 DIAGNOSIS — E785 Hyperlipidemia, unspecified: Secondary | ICD-10-CM | POA: Diagnosis present

## 2021-07-18 DIAGNOSIS — Z7984 Long term (current) use of oral hypoglycemic drugs: Secondary | ICD-10-CM

## 2021-07-18 DIAGNOSIS — Z888 Allergy status to other drugs, medicaments and biological substances status: Secondary | ICD-10-CM

## 2021-07-18 DIAGNOSIS — D62 Acute posthemorrhagic anemia: Secondary | ICD-10-CM | POA: Diagnosis not present

## 2021-07-18 DIAGNOSIS — Z96652 Presence of left artificial knee joint: Principal | ICD-10-CM

## 2021-07-18 DIAGNOSIS — Z923 Personal history of irradiation: Secondary | ICD-10-CM

## 2021-07-18 DIAGNOSIS — Z96641 Presence of right artificial hip joint: Secondary | ICD-10-CM | POA: Diagnosis present

## 2021-07-18 DIAGNOSIS — M810 Age-related osteoporosis without current pathological fracture: Secondary | ICD-10-CM | POA: Diagnosis present

## 2021-07-18 DIAGNOSIS — M1712 Unilateral primary osteoarthritis, left knee: Principal | ICD-10-CM | POA: Diagnosis present

## 2021-07-18 DIAGNOSIS — I10 Essential (primary) hypertension: Secondary | ICD-10-CM | POA: Diagnosis present

## 2021-07-18 DIAGNOSIS — Z85850 Personal history of malignant neoplasm of thyroid: Secondary | ICD-10-CM

## 2021-07-18 DIAGNOSIS — Z7902 Long term (current) use of antithrombotics/antiplatelets: Secondary | ICD-10-CM

## 2021-07-18 DIAGNOSIS — Z85828 Personal history of other malignant neoplasm of skin: Secondary | ICD-10-CM

## 2021-07-18 DIAGNOSIS — Z7982 Long term (current) use of aspirin: Secondary | ICD-10-CM

## 2021-07-18 DIAGNOSIS — Z96653 Presence of artificial knee joint, bilateral: Secondary | ICD-10-CM | POA: Insufficient documentation

## 2021-07-18 DIAGNOSIS — Z972 Presence of dental prosthetic device (complete) (partial): Secondary | ICD-10-CM

## 2021-07-18 DIAGNOSIS — Z885 Allergy status to narcotic agent status: Secondary | ICD-10-CM

## 2021-07-18 DIAGNOSIS — E1151 Type 2 diabetes mellitus with diabetic peripheral angiopathy without gangrene: Secondary | ICD-10-CM | POA: Diagnosis present

## 2021-07-18 DIAGNOSIS — G20 Parkinson's disease: Secondary | ICD-10-CM | POA: Diagnosis present

## 2021-07-18 DIAGNOSIS — Z8673 Personal history of transient ischemic attack (TIA), and cerebral infarction without residual deficits: Secondary | ICD-10-CM

## 2021-07-18 DIAGNOSIS — F419 Anxiety disorder, unspecified: Secondary | ICD-10-CM | POA: Diagnosis present

## 2021-07-18 DIAGNOSIS — F32A Depression, unspecified: Secondary | ICD-10-CM | POA: Diagnosis present

## 2021-07-18 DIAGNOSIS — M1990 Unspecified osteoarthritis, unspecified site: Secondary | ICD-10-CM | POA: Insufficient documentation

## 2021-07-18 DIAGNOSIS — Z96651 Presence of right artificial knee joint: Secondary | ICD-10-CM | POA: Diagnosis present

## 2021-07-18 DIAGNOSIS — N390 Urinary tract infection, site not specified: Secondary | ICD-10-CM | POA: Diagnosis present

## 2021-07-18 DIAGNOSIS — Z79899 Other long term (current) drug therapy: Secondary | ICD-10-CM

## 2021-07-18 HISTORY — PX: TOTAL KNEE ARTHROPLASTY: SHX125

## 2021-07-18 LAB — COMPREHENSIVE METABOLIC PANEL
ALT: <5 U/L (ref 0–44)
AST: 16 U/L (ref 15–41)
Albumin: 3.1 g/dL — ABNORMAL LOW (ref 3.5–5.0)
Alkaline Phosphatase: 52 U/L (ref 38–126)
Anion gap: 8 (ref 5–15)
BUN: 21 mg/dL (ref 8–23)
CO2: 21 mmol/L — ABNORMAL LOW (ref 22–32)
Calcium: 8.3 mg/dL — ABNORMAL LOW (ref 8.9–10.3)
Chloride: 106 mmol/L (ref 98–111)
Creatinine, Ser: 1.21 mg/dL — ABNORMAL HIGH (ref 0.44–1.00)
GFR, Estimated: 45 mL/min — ABNORMAL LOW (ref >60)
Glucose, Bld: 114 mg/dL — ABNORMAL HIGH (ref 70–99)
Potassium: 4.2 mmol/L (ref 3.5–5.1)
Sodium: 135 mmol/L (ref 135–145)
Total Bilirubin: 0.2 mg/dL — ABNORMAL LOW (ref 0.3–1.2)
Total Protein: 5.7 g/dL — ABNORMAL LOW (ref 6.5–8.1)

## 2021-07-18 LAB — CBC WITH DIFFERENTIAL/PLATELET
Abs Immature Granulocytes: 0.02 10*3/uL (ref 0.00–0.07)
Basophils Absolute: 0 10*3/uL (ref 0.0–0.1)
Basophils Relative: 0 %
Eosinophils Absolute: 0.1 10*3/uL (ref 0.0–0.5)
Eosinophils Relative: 1 %
HCT: 34.6 % — ABNORMAL LOW (ref 36.0–46.0)
Hemoglobin: 10.7 g/dL — ABNORMAL LOW (ref 12.0–15.0)
Immature Granulocytes: 0 %
Lymphocytes Relative: 27 %
Lymphs Abs: 1.4 10*3/uL (ref 0.7–4.0)
MCH: 28.9 pg (ref 26.0–34.0)
MCHC: 30.9 g/dL (ref 30.0–36.0)
MCV: 93.5 fL (ref 80.0–100.0)
Monocytes Absolute: 0.5 10*3/uL (ref 0.1–1.0)
Monocytes Relative: 11 %
Neutro Abs: 3.1 10*3/uL (ref 1.7–7.7)
Neutrophils Relative %: 61 %
Platelets: 151 10*3/uL (ref 150–400)
RBC: 3.7 MIL/uL — ABNORMAL LOW (ref 3.87–5.11)
RDW: 13.7 % (ref 11.5–15.5)
WBC: 5.1 10*3/uL (ref 4.0–10.5)
nRBC: 0 % (ref 0.0–0.2)

## 2021-07-18 LAB — GLUCOSE, CAPILLARY
Glucose-Capillary: 105 mg/dL — ABNORMAL HIGH (ref 70–99)
Glucose-Capillary: 128 mg/dL — ABNORMAL HIGH (ref 70–99)
Glucose-Capillary: 103 mg/dL — ABNORMAL HIGH (ref 70–99)
Glucose-Capillary: 142 mg/dL — ABNORMAL HIGH (ref 70–99)
Glucose-Capillary: 137 mg/dL — ABNORMAL HIGH (ref 70–99)

## 2021-07-18 LAB — ALKALINE PHOSPHATASE: Alkaline Phosphatase: 52 U/L (ref 38–126)

## 2021-07-18 LAB — MAGNESIUM: Magnesium: 1.9 mg/dL (ref 1.7–2.4)

## 2021-07-18 SURGERY — ARTHROPLASTY, KNEE, TOTAL
Anesthesia: Spinal | Site: Knee | Laterality: Left

## 2021-07-18 MED ORDER — FAMOTIDINE 20 MG PO TABS
20.0000 mg | ORAL_TABLET | Freq: Once | ORAL | Status: AC
  Administered 2021-07-18: 20 mg via ORAL

## 2021-07-18 MED ORDER — OXYCODONE HCL 5 MG PO TABS
5.0000 mg | ORAL_TABLET | ORAL | Status: DC | PRN
  Administered 2021-07-18 (×2): 10 mg via ORAL

## 2021-07-18 MED ORDER — CHLORHEXIDINE GLUCONATE 0.12 % MT SOLN
OROMUCOSAL | Status: AC
  Administered 2021-07-18: 15 mL via OROMUCOSAL
  Filled 2021-07-18: qty 15

## 2021-07-18 MED ORDER — ACETAMINOPHEN 10 MG/ML IV SOLN
INTRAVENOUS | Status: AC
  Filled 2021-07-18: qty 100

## 2021-07-18 MED ORDER — ASPIRIN-DIPYRIDAMOLE ER 25-200 MG PO CP12
1.0000 | ORAL_CAPSULE | Freq: Two times a day (BID) | ORAL | Status: DC
  Administered 2021-07-18 – 2021-07-23 (×8): 1 via ORAL
  Filled 2021-07-18 (×10): qty 1

## 2021-07-18 MED ORDER — PHENYLEPHRINE HCL-NACL 20-0.9 MG/250ML-% IV SOLN
INTRAVENOUS | Status: AC
  Filled 2021-07-18: qty 250

## 2021-07-18 MED ORDER — SODIUM CHLORIDE FLUSH 0.9 % IV SOLN
INTRAVENOUS | Status: AC
  Filled 2021-07-18: qty 80

## 2021-07-18 MED ORDER — TRAZODONE HCL 100 MG PO TABS
100.0000 mg | ORAL_TABLET | Freq: Every day | ORAL | Status: DC
  Administered 2021-07-18 – 2021-07-22 (×5): 100 mg via ORAL
  Filled 2021-07-18 (×5): qty 1

## 2021-07-18 MED ORDER — OXYCODONE HCL 5 MG PO TABS
10.0000 mg | ORAL_TABLET | ORAL | Status: DC | PRN
  Filled 2021-07-18: qty 2

## 2021-07-18 MED ORDER — SENNOSIDES-DOCUSATE SODIUM 8.6-50 MG PO TABS: 1.0000 | ORAL_TABLET | Freq: Every evening | ORAL | Status: DC | PRN

## 2021-07-18 MED ORDER — VITAMIN B-12 1000 MCG PO TABS
500.0000 ug | ORAL_TABLET | Freq: Every day | ORAL | Status: DC
  Administered 2021-07-18 – 2021-07-23 (×6): 500 ug via ORAL
  Filled 2021-07-18 (×6): qty 1

## 2021-07-18 MED ORDER — BUPIVACAINE HCL (PF) 0.5 % IJ SOLN
INTRAMUSCULAR | Status: AC
  Filled 2021-07-18: qty 10

## 2021-07-18 MED ORDER — SODIUM CHLORIDE 0.9 % IR SOLN: Status: DC | PRN

## 2021-07-18 MED ORDER — PROPOFOL 1000 MG/100ML IV EMUL
INTRAVENOUS | Status: AC
Start: 2021-07-18 — End: ?
  Filled 2021-07-18: qty 100

## 2021-07-18 MED ORDER — OXYCODONE HCL 5 MG PO TABS
ORAL_TABLET | ORAL | Status: AC
  Filled 2021-07-18: qty 2

## 2021-07-18 MED ORDER — PHENYLEPHRINE HCL-NACL 20-0.9 MG/250ML-% IV SOLN
INTRAVENOUS | Status: DC | PRN
  Administered 2021-07-18: 20 ug/min via INTRAVENOUS

## 2021-07-18 MED ORDER — ENOXAPARIN SODIUM 30 MG/0.3ML IJ SOSY
30.0000 mg | PREFILLED_SYRINGE | Freq: Two times a day (BID) | INTRAMUSCULAR | Status: DC
  Administered 2021-07-19 – 2021-07-23 (×9): 30 mg via SUBCUTANEOUS
  Filled 2021-07-18 (×9): qty 0.3

## 2021-07-18 MED ORDER — LISINOPRIL-HYDROCHLOROTHIAZIDE 20-25 MG PO TABS: 1.0000 | ORAL_TABLET | Freq: Every day | ORAL | Status: DC

## 2021-07-18 MED ORDER — NEOMYCIN-POLYMYXIN B GU 40-200000 IR SOLN
Status: AC
  Filled 2021-07-18: qty 20

## 2021-07-18 MED ORDER — ONDANSETRON HCL 4 MG/2ML IJ SOLN: 4.0000 mg | Freq: Four times a day (QID) | INTRAMUSCULAR | Status: DC | PRN

## 2021-07-18 MED ORDER — PHENOL 1.4 % MT LIQD: 1.0000 | OROMUCOSAL | Status: DC | PRN

## 2021-07-18 MED ORDER — ACETAMINOPHEN 10 MG/ML IV SOLN
INTRAVENOUS | Status: DC | PRN
  Administered 2021-07-18: 1000 mg via INTRAVENOUS

## 2021-07-18 MED ORDER — METHOCARBAMOL 1000 MG/10ML IJ SOLN
500.0000 mg | Freq: Four times a day (QID) | INTRAVENOUS | Status: DC | PRN
  Administered 2021-07-18: 500 mg via INTRAVENOUS
  Filled 2021-07-18 (×2): qty 5

## 2021-07-18 MED ORDER — METOCLOPRAMIDE HCL 5 MG/ML IJ SOLN: 5.0000 mg | Freq: Three times a day (TID) | INTRAMUSCULAR | Status: DC | PRN

## 2021-07-18 MED ORDER — BUPIVACAINE HCL (PF) 0.5 % IJ SOLN
INTRAMUSCULAR | Status: DC | PRN
  Administered 2021-07-18: 2.3 mL

## 2021-07-18 MED ORDER — VITAMIN D 25 MCG (1000 UNIT) PO TABS
4000.0000 [IU] | ORAL_TABLET | Freq: Every day | ORAL | Status: DC
  Administered 2021-07-18 – 2021-07-23 (×6): 4000 [IU] via ORAL
  Filled 2021-07-18 (×6): qty 4

## 2021-07-18 MED ORDER — MORPHINE SULFATE (PF) 10 MG/ML IV SOLN
INTRAVENOUS | Status: AC
  Filled 2021-07-18: qty 1

## 2021-07-18 MED ORDER — FLUTICASONE PROPIONATE 50 MCG/ACT NA SUSP: 2.0000 | Freq: Every day | NASAL | Status: DC | PRN

## 2021-07-18 MED ORDER — PHENYLEPHRINE 80 MCG/ML (10ML) SYRINGE FOR IV PUSH (FOR BLOOD PRESSURE SUPPORT)
PREFILLED_SYRINGE | INTRAVENOUS | Status: DC | PRN
  Administered 2021-07-18 (×3): 80 ug via INTRAVENOUS

## 2021-07-18 MED ORDER — PROMETHAZINE HCL 25 MG/ML IJ SOLN: 6.2500 mg | INTRAMUSCULAR | Status: DC | PRN

## 2021-07-18 MED ORDER — SODIUM CHLORIDE 0.9 % IV SOLN: INTRAVENOUS | Status: DC

## 2021-07-18 MED ORDER — ACETAMINOPHEN 325 MG PO TABS: 325.0000 mg | ORAL_TABLET | Freq: Four times a day (QID) | ORAL | Status: DC | PRN

## 2021-07-18 MED ORDER — ONDANSETRON HCL 4 MG PO TABS: 4.0000 mg | ORAL_TABLET | Freq: Four times a day (QID) | ORAL | Status: DC | PRN

## 2021-07-18 MED ORDER — DOCUSATE SODIUM 100 MG PO CAPS
100.0000 mg | ORAL_CAPSULE | Freq: Two times a day (BID) | ORAL | Status: DC
  Administered 2021-07-18 – 2021-07-22 (×5): 100 mg via ORAL
  Filled 2021-07-18 (×11): qty 1

## 2021-07-18 MED ORDER — PANTOPRAZOLE SODIUM 40 MG PO TBEC
40.0000 mg | DELAYED_RELEASE_TABLET | Freq: Every day | ORAL | Status: DC
  Administered 2021-07-18 – 2021-07-23 (×6): 40 mg via ORAL
  Filled 2021-07-18 (×6): qty 1

## 2021-07-18 MED ORDER — FLUOXETINE HCL 20 MG PO CAPS
40.0000 mg | ORAL_CAPSULE | Freq: Every day | ORAL | Status: DC
  Administered 2021-07-19 – 2021-07-23 (×5): 40 mg via ORAL
  Filled 2021-07-18 (×5): qty 2

## 2021-07-18 MED ORDER — HYDROCHLOROTHIAZIDE 25 MG PO TABS
25.0000 mg | ORAL_TABLET | Freq: Every day | ORAL | Status: DC
  Administered 2021-07-18 – 2021-07-23 (×5): 25 mg via ORAL
  Filled 2021-07-18 (×5): qty 1

## 2021-07-18 MED ORDER — BUPIVACAINE LIPOSOME 1.3 % IJ SUSP
INTRAMUSCULAR | Status: AC
  Filled 2021-07-18: qty 40

## 2021-07-18 MED ORDER — CEFAZOLIN SODIUM-DEXTROSE 2-4 GM/100ML-% IV SOLN
INTRAVENOUS | Status: AC
  Filled 2021-07-18: qty 100

## 2021-07-18 MED ORDER — BISACODYL 5 MG PO TBEC
5.0000 mg | DELAYED_RELEASE_TABLET | Freq: Every day | ORAL | Status: DC | PRN
  Filled 2021-07-18: qty 1

## 2021-07-18 MED ORDER — ALPRAZOLAM 1 MG PO TABS
1.0000 mg | ORAL_TABLET | Freq: Every day | ORAL | Status: DC | PRN
  Administered 2021-07-21: 1 mg via ORAL
  Filled 2021-07-18: qty 1

## 2021-07-18 MED ORDER — SODIUM CHLORIDE 0.9 % IV SOLN
INTRAVENOUS | Status: DC
Start: 2021-07-18 — End: 2021-07-23

## 2021-07-18 MED ORDER — ALUM & MAG HYDROXIDE-SIMETH 200-200-20 MG/5ML PO SUSP: 30.0000 mL | ORAL | Status: DC | PRN

## 2021-07-18 MED ORDER — CEFAZOLIN SODIUM-DEXTROSE 1-4 GM/50ML-% IV SOLN
1.0000 g | Freq: Four times a day (QID) | INTRAVENOUS | Status: AC
  Administered 2021-07-18: 1 g via INTRAVENOUS
  Filled 2021-07-18: qty 50

## 2021-07-18 MED ORDER — SODIUM CHLORIDE (PF) 0.9 % IJ SOLN
INTRAMUSCULAR | Status: DC | PRN
  Administered 2021-07-18: 91 mL via INTRAMUSCULAR

## 2021-07-18 MED ORDER — FAMOTIDINE 20 MG PO TABS
ORAL_TABLET | ORAL | Status: AC
  Filled 2021-07-18: qty 1

## 2021-07-18 MED ORDER — FLEET ENEMA 7-19 GM/118ML RE ENEM: 1.0000 | ENEMA | Freq: Once | RECTAL | Status: DC | PRN

## 2021-07-18 MED ORDER — METFORMIN HCL 500 MG PO TABS
500.0000 mg | ORAL_TABLET | Freq: Two times a day (BID) | ORAL | Status: DC
  Administered 2021-07-18 – 2021-07-19 (×3): 500 mg via ORAL
  Filled 2021-07-18 (×6): qty 1

## 2021-07-18 MED ORDER — MENTHOL 3 MG MT LOZG: 1.0000 | LOZENGE | OROMUCOSAL | Status: DC | PRN

## 2021-07-18 MED ORDER — HYDROMORPHONE HCL 1 MG/ML IJ SOLN
0.5000 mg | INTRAMUSCULAR | Status: DC | PRN
  Administered 2021-07-19: 1 mg via INTRAVENOUS
  Filled 2021-07-18: qty 1

## 2021-07-18 MED ORDER — CEFAZOLIN SODIUM-DEXTROSE 2-4 GM/100ML-% IV SOLN
2.0000 g | INTRAVENOUS | Status: AC
Start: 2021-07-18 — End: 2021-07-18
  Administered 2021-07-18: 2 g via INTRAVENOUS

## 2021-07-18 MED ORDER — ZOLPIDEM TARTRATE 5 MG PO TABS: 5.0000 mg | ORAL_TABLET | Freq: Every evening | ORAL | Status: DC | PRN

## 2021-07-18 MED ORDER — CHLORHEXIDINE GLUCONATE 0.12 % MT SOLN: 15.0000 mL | Freq: Once | OROMUCOSAL | Status: AC

## 2021-07-18 MED ORDER — FENTANYL CITRATE (PF) 100 MCG/2ML IJ SOLN
INTRAMUSCULAR | Status: AC
  Filled 2021-07-18: qty 2

## 2021-07-18 MED ORDER — SURGIPHOR WOUND IRRIGATION SYSTEM - OPTIME: TOPICAL | Status: DC | PRN

## 2021-07-18 MED ORDER — BUPIVACAINE-EPINEPHRINE (PF) 0.25% -1:200000 IJ SOLN
INTRAMUSCULAR | Status: AC
  Filled 2021-07-18: qty 60

## 2021-07-18 MED ORDER — PIOGLITAZONE HCL 15 MG PO TABS
15.0000 mg | ORAL_TABLET | Freq: Every day | ORAL | Status: DC
  Administered 2021-07-18 – 2021-07-21 (×4): 15 mg via ORAL
  Filled 2021-07-18 (×7): qty 1

## 2021-07-18 MED ORDER — METHOCARBAMOL 500 MG PO TABS
500.0000 mg | ORAL_TABLET | Freq: Four times a day (QID) | ORAL | Status: DC | PRN
  Administered 2021-07-19 – 2021-07-21 (×6): 500 mg via ORAL
  Filled 2021-07-18 (×7): qty 1

## 2021-07-18 MED ORDER — POLYVINYL ALCOHOL 1.4 % OP SOLN
1.0000 [drp] | Freq: Every day | OPHTHALMIC | Status: DC | PRN
Start: 2021-07-18 — End: 2021-07-23

## 2021-07-18 MED ORDER — PROPOFOL 500 MG/50ML IV EMUL
INTRAVENOUS | Status: DC | PRN
  Administered 2021-07-18: 100 ug/kg/min via INTRAVENOUS

## 2021-07-18 MED ORDER — ATORVASTATIN CALCIUM 10 MG PO TABS
10.0000 mg | ORAL_TABLET | Freq: Every day | ORAL | Status: DC
  Administered 2021-07-18 – 2021-07-23 (×6): 10 mg via ORAL
  Filled 2021-07-18 (×6): qty 1

## 2021-07-18 MED ORDER — MIDAZOLAM HCL 2 MG/2ML IJ SOLN
INTRAMUSCULAR | Status: AC
  Filled 2021-07-18: qty 2

## 2021-07-18 MED ORDER — LISINOPRIL 20 MG PO TABS
20.0000 mg | ORAL_TABLET | Freq: Every day | ORAL | Status: DC
Start: 2021-07-18 — End: 2021-07-23
  Administered 2021-07-18 – 2021-07-23 (×5): 20 mg via ORAL
  Filled 2021-07-18 (×6): qty 1

## 2021-07-18 MED ORDER — LORATADINE 10 MG PO TABS
10.0000 mg | ORAL_TABLET | Freq: Every day | ORAL | Status: DC
Start: 2021-07-18 — End: 2021-07-23
  Administered 2021-07-18 – 2021-07-23 (×6): 10 mg via ORAL
  Filled 2021-07-18 (×6): qty 1

## 2021-07-18 MED ORDER — METHIMAZOLE 10 MG PO TABS
10.0000 mg | ORAL_TABLET | Freq: Every day | ORAL | Status: DC
  Administered 2021-07-19 – 2021-07-23 (×5): 10 mg via ORAL
  Filled 2021-07-18 (×5): qty 1

## 2021-07-18 MED ORDER — METOCLOPRAMIDE HCL 5 MG PO TABS: 5.0000 mg | ORAL_TABLET | Freq: Three times a day (TID) | ORAL | Status: DC | PRN

## 2021-07-18 MED ORDER — ORAL CARE MOUTH RINSE: 15.0000 mL | Freq: Once | OROMUCOSAL | Status: AC

## 2021-07-18 MED ORDER — METOPROLOL TARTRATE 50 MG PO TABS
100.0000 mg | ORAL_TABLET | Freq: Two times a day (BID) | ORAL | Status: DC
  Administered 2021-07-19 – 2021-07-23 (×5): 100 mg via ORAL
  Filled 2021-07-18 (×7): qty 2

## 2021-07-18 MED ORDER — INSULIN ASPART 100 UNIT/ML IJ SOLN
0.0000 [IU] | Freq: Three times a day (TID) | INTRAMUSCULAR | Status: DC
  Administered 2021-07-18 – 2021-07-21 (×6): 2 [IU] via SUBCUTANEOUS
  Administered 2021-07-22: 3 [IU] via SUBCUTANEOUS
  Administered 2021-07-22 – 2021-07-23 (×2): 2 [IU] via SUBCUTANEOUS
  Filled 2021-07-18 (×10): qty 1

## 2021-07-18 MED ORDER — DIPHENHYDRAMINE HCL 12.5 MG/5ML PO ELIX: 12.5000 mg | ORAL_SOLUTION | ORAL | Status: DC | PRN

## 2021-07-18 SURGICAL SUPPLY — 68 items
BLADE SAGITTAL 25.0X1.19X90 (BLADE) ×2 IMPLANT
BLADE SAW 90X13X1.19 OSCILLAT (BLADE) ×2 IMPLANT
BNDG ELASTIC 6X5.8 VLCR STR LF (GAUZE/BANDAGES/DRESSINGS) ×2 IMPLANT
CANISTER WOUND CARE 500ML ATS (WOUND CARE) ×2 IMPLANT
CEMENT FEMORAL COMP SZ3P LEFT (Femur) ×1 IMPLANT
CEMENT HV SMART SET (Cement) ×4 IMPLANT
CEMENT PATELLA RESURF SZ1 (Cement) ×1 IMPLANT
CHLORAPREP W/TINT 26 (MISCELLANEOUS) ×4 IMPLANT
COOLER POLAR GLACIER W/PUMP (MISCELLANEOUS) ×2 IMPLANT
CUFF TOURN SGL QUICK 24 (TOURNIQUET CUFF)
CUFF TOURN SGL QUICK 34 (TOURNIQUET CUFF)
CUFF TRNQT CYL 24X4X16.5-23 (TOURNIQUET CUFF) IMPLANT
CUFF TRNQT CYL 34X4.125X (TOURNIQUET CUFF) IMPLANT
DERMABOND ADVANCED (GAUZE/BANDAGES/DRESSINGS) ×1
DERMABOND ADVANCED .7 DNX12 (GAUZE/BANDAGES/DRESSINGS) IMPLANT
DRAPE 3/4 80X56 (DRAPES) ×4 IMPLANT
DRSG MEPILEX SACRM 8.7X9.8 (GAUZE/BANDAGES/DRESSINGS) ×2 IMPLANT
DRSG TELFA 3X8 NADH STRL (GAUZE/BANDAGES/DRESSINGS) ×1 IMPLANT
ELECT CAUTERY BLADE 6.4 (BLADE) ×2 IMPLANT
ELECT REM PT RETURN 9FT ADLT (ELECTROSURGICAL) ×2
ELECTRODE REM PT RTRN 9FT ADLT (ELECTROSURGICAL) ×1 IMPLANT
GAUZE 4X4 16PLY ~~LOC~~+RFID DBL (SPONGE) ×1 IMPLANT
GAUZE XEROFORM 1X8 LF (GAUZE/BANDAGES/DRESSINGS) ×1 IMPLANT
GLOVE SURG ORTHO 8.0 STRL STRW (GLOVE) ×2 IMPLANT
GLOVE SURG SYN 9.0  PF PI (GLOVE) ×1
GLOVE SURG SYN 9.0 PF PI (GLOVE) ×1 IMPLANT
GLOVE SURG UNDER LTX SZ8 (GLOVE) ×2 IMPLANT
GLOVE SURG UNDER POLY LF SZ9 (GLOVE) ×2 IMPLANT
GOWN SRG 2XL LVL 4 RGLN SLV (GOWNS) ×1 IMPLANT
GOWN STRL NON-REIN 2XL LVL4 (GOWNS) ×1
GOWN STRL REUS W/ TWL LRG LVL3 (GOWN DISPOSABLE) ×1 IMPLANT
GOWN STRL REUS W/ TWL XL LVL3 (GOWN DISPOSABLE) ×1 IMPLANT
GOWN STRL REUS W/TWL LRG LVL3 (GOWN DISPOSABLE) ×1
GOWN STRL REUS W/TWL XL LVL3 (GOWN DISPOSABLE) ×1
HOLDER FOLEY CATH W/STRAP (MISCELLANEOUS) ×2 IMPLANT
HOOD PEEL AWAY FLYTE STAYCOOL (MISCELLANEOUS) ×4 IMPLANT
INSERT TIBIAL SZ3 L HT 10M FLX (Insert) ×1 IMPLANT
IV NS IRRIG 3000ML ARTHROMATIC (IV SOLUTION) ×2 IMPLANT
KIT PREVENA INCISION MGT20CM45 (CANNISTER) ×2 IMPLANT
KIT TURNOVER KIT A (KITS) ×2 IMPLANT
MANIFOLD NEPTUNE II (INSTRUMENTS) ×4 IMPLANT
NDL SAFETY ECLIPSE 18X1.5 (NEEDLE) ×1 IMPLANT
NDL SPNL 20GX3.5 QUINCKE YW (NEEDLE) ×1 IMPLANT
NEEDLE HYPO 18GX1.5 SHARP (NEEDLE) ×1
NEEDLE SPNL 20GX3.5 QUINCKE YW (NEEDLE) ×2 IMPLANT
NS IRRIG 1000ML POUR BTL (IV SOLUTION) ×2 IMPLANT
PACK TOTAL KNEE (MISCELLANEOUS) ×2 IMPLANT
PAD WRAPON POLAR KNEE (MISCELLANEOUS) ×1 IMPLANT
PULSAVAC PLUS IRRIG FAN TIP (DISPOSABLE) ×2
SCALPEL PROTECTED #10 DISP (BLADE) ×4 IMPLANT
SOLUTION IRRIG SURGIPHOR (IV SOLUTION) ×2 IMPLANT
STAPLER SKIN PROX 35W (STAPLE) ×2 IMPLANT
STEM EXTENSION 11MMX30MM (Stem) ×1 IMPLANT
SUCTION FRAZIER HANDLE 10FR (MISCELLANEOUS) ×1
SUCTION TUBE FRAZIER 10FR DISP (MISCELLANEOUS) ×1 IMPLANT
SUT DVC 2 QUILL PDO  T11 36X36 (SUTURE) ×1
SUT DVC 2 QUILL PDO T11 36X36 (SUTURE) ×1 IMPLANT
SUT ETHIBOND 2 V 37 (SUTURE) IMPLANT
SUT V-LOC 90 ABS DVC 3-0 CL (SUTURE) ×2 IMPLANT
SYR 20ML LL LF (SYRINGE) ×2 IMPLANT
SYR 50ML LL SCALE MARK (SYRINGE) ×4 IMPLANT
TIBIAL TRAY FIXED MEDACTA 0207 (Bone Implant) ×1 IMPLANT
TIP FAN IRRIG PULSAVAC PLUS (DISPOSABLE) ×1 IMPLANT
TOWEL OR 17X26 4PK STRL BLUE (TOWEL DISPOSABLE) ×2 IMPLANT
TOWER CARTRIDGE SMART MIX (DISPOSABLE) ×2 IMPLANT
TRAY FOLEY MTR SLVR 16FR STAT (SET/KITS/TRAYS/PACK) ×2 IMPLANT
WATER STERILE IRR 1000ML POUR (IV SOLUTION) ×2 IMPLANT
WRAPON POLAR PAD KNEE (MISCELLANEOUS) ×2

## 2021-07-18 NOTE — TOC Progression Note (Signed)
Transition of Care [facility]) - Progression Note    Patient Details  Name: Adriana Sanders MRN: [id number] Date of Birth: 02-12-1941  Transition of Care [facility]) CM/SW [place], RN Phone Number: 07/18/2021, 2:15 PM  Clinical Narrative:      Patient is set up with [facility] for [facility] services, prior to surgery by Surgeons office      Expected Discharge Plan and Services                                                 Social Determinants of Health (SDOH) Interventions    Readmission Risk Interventions     View : No data to display.

## 2021-07-18 NOTE — Evaluation (Signed)
Physical Therapy Evaluation Patient Details Name: Adriana Sanders MRN: [id number] DOB: Oct 13, 1940 Today's Date: 07/18/2021  History of Present Illness  Patient is an 81 year old female s/p left total knee arthroplasty  Clinical Impression  Physical Therapy session completed this date. Patient tolerated session fairly well despite being drowsy from pain medication. Patient reported a 7/10 pain in L knee at beginning of session. Upon entering room patient was supine in bed with family, RN, and NT present. Home set up was received from patient's daughter. Patient lives in a 2 story home with 2 STE and a R hand rail. There are ~12 steps with a R HR within the home, however patient will be living on the main floor upon discharge. Daughter will be able to assist PRN as she works for 12 hours out of the day. At baseline patient ambulates with a RW (patient also has a SPC as well). Daughter assists patient intermittently.   Increased time and effort to complete all activities due to patient's Parkinson's. Patient demonstrated decreased ROM and strength in BLEs. L knee flexion was 89 degrees during initial evaluation, and MMT ranged from 3to3+/5. Patient was able to complete supine to sit bed mobility at SBA with increased assistance required from bed rails. Sit to stand from EOB was completed at elevated height, cueing on hand placement, and Min A with RW. With tactile cueing and Min A for RW navigation, patient was able to complete ~7steps at CGA-MIN A. Patient was left in bed with all needs met and in reach with daughter present. HEP needs to be brought in next session and reviewed with patient. Patient would continue to benefit from skilled physical therapy in order to optimize patient's return to PLOF. Recommend STE upon discharge from acute hospitalization.      Recommendations for follow up therapy are one component of a multi-disciplinary discharge planning process, led by the attending physician.   Recommendations may be updated based on patient status, additional functional criteria and insurance authorization.  Follow Up Recommendations Skilled nursing-short term rehab (<3 hours/day)    Assistance Recommended at Discharge Frequent or constant Supervision/Assistance  Patient can return home with the following  A little help with walking and/or transfers;A little help with bathing/dressing/bathroom;Help with stairs or ramp for entrance    Equipment Recommendations Other (comment) (TBD)  Recommendations for Other Services       Functional Status Assessment Patient has had a recent decline in their functional status and demonstrates the ability to make significant improvements in function in a reasonable and predictable amount of time.     Precautions / Restrictions Precautions Precautions: Fall Restrictions Weight Bearing Restrictions: Yes LLE Weight Bearing: Weight bearing as tolerated      Mobility  Bed Mobility Overal bed mobility: Needs Assistance Bed Mobility: Supine to Sit, Sit to Supine     Supine to sit: Min guard Sit to supine: Min assist   General bed mobility comments: increased use of bed hand rails to complete transfer, Min A at ankles to assist back into bed    Transfers Overall transfer level: Needs assistance Equipment used: Rolling walker (2 wheels) Transfers: Sit to/from Stand Sit to Stand: Min assist           General transfer comment: cueing on hand placement    Ambulation/Gait Ambulation/Gait assistance: Min guard, Min assist Gait Distance (Feet): 7 Feet (2 steps forward/2 steps back, 3 lateral steps to the R to [facility]) Assistive device: Rolling walker (2 wheels) Gait Pattern/deviations: Step-to  pattern Gait velocity: decreased     General Gait Details: Min A with RW navigation, tactile cueing on LLE to promote initiation of movement, increased time to initiation movement due to PD  Stairs            Wheelchair Mobility     Modified Rankin (Stroke Patients Only)       Balance Overall balance assessment: Needs assistance Sitting-balance support: Bilateral upper extremity supported, Feet supported Sitting balance-Leahy Scale: Fair     Standing balance support: During functional activity, Reliant on assistive device for balance, Bilateral upper extremity supported Standing balance-Leahy Scale: Poor                               Pertinent Vitals/Pain Pain Assessment Pain Assessment: 0-10 Pain Score: 7  Pain Location: L knee Pain Descriptors / Indicators: Discomfort, Aching Pain Intervention(s): Monitored during session, Repositioned, Limited activity within patient's tolerance    Home Living Family/patient expects to be discharged to:: Private residence Living Arrangements: Children Available Help at Discharge: Family;Available PRN/intermittently (daughter works for 12hr/day) Type of Home: House Home Access: Stairs to enter Entrance Stairs-Rails: Right Entrance Stairs-Number of Steps: 2 Alternate Level Stairs-Number of Steps: 12 Home Layout: Two level;1/2 bath on main level Home Equipment: Rolling Walker (2 wheels);BSC/3in1;Cane - single point      Prior Function Prior Level of Function : Independent/Modified Independent;Needs assist             Mobility Comments: ambulates with a RW at baseline ADLs Comments: assistance intermittently from daughter     Hand Dominance        Extremity/Trunk Assessment   Upper Extremity Assessment Upper Extremity Assessment: Generalized weakness    Lower Extremity Assessment Lower Extremity Assessment: Generalized weakness;LLE deficits/detail;RLE deficits/detail RLE Deficits / Details: at least 3+/5 strength LLE Deficits / Details: Decreased knee flexion due to sugical limb, at least 3/5 due to patient's ability to perform a SLR LLE: Unable to fully assess due to pain       Communication   Communication: No difficulties   Cognition Arousal/Alertness: Awake/alert Behavior During Therapy: WFL for tasks assessed/performed Overall Cognitive Status: Within Functional Limits for tasks assessed                                 General Comments: A&Ox3 self location and situation        General Comments      Exercises Total Joint Exercises Goniometric ROM: 89 degrees of active L knee flexion   Assessment/Plan    PT Assessment Patient needs continued PT services  PT Problem List Decreased strength;Decreased mobility;Decreased range of motion;Decreased activity tolerance;Decreased balance;Pain       PT Treatment Interventions DME instruction;Therapeutic activities;Therapeutic exercise;Gait training;Stair training;Balance training    PT Goals (Current goals can be found in the Care Plan section)  Acute Rehab PT Goals Patient Stated Goal: patient did not state a goal PT Goal Formulation: With patient Time For Goal Achievement: 08/01/21 Potential to Achieve Goals: Fair    Frequency BID     Co-evaluation               AM-PAC PT "6 Clicks" Mobility  Outcome Measure Help needed turning from your back to your side while in a flat bed without using bedrails?: A Little Help needed moving from lying on your back to sitting on the side of  a flat bed without using bedrails?: A Little Help needed moving to and from a bed to a chair (including a wheelchair)?: A Lot Help needed standing up from a chair using your arms (e.g., wheelchair or bedside chair)?: A Lot Help needed to walk in hospital room?: A Lot Help needed climbing 3-5 steps with a railing? : A Lot 6 Click Score: 14    End of Session Equipment Utilized During Treatment: Gait belt Activity Tolerance: Patient tolerated treatment well Patient left: in bed;with call bell/phone within reach;with bed alarm set;with family/visitor present Nurse Communication: Mobility status PT Visit Diagnosis: Unsteadiness on feet (R26.81);Muscle  weakness (generalized) (M62.81);Pain Pain - Right/Left: Left Pain - part of body: Knee    Time: 4731-9243 PT Time Calculation (min) (ACUTE ONLY): 28 min   Charges:   PT Evaluation $PT Eval Moderate Complexity: 1 Mod PT Treatments $Gait Training: 8-22 mins        Iva Boop, PT  07/18/21. 3:04 PM

## 2021-07-18 NOTE — H&P (Signed)
Chief Complaint  Patient presents with   Pre-op Exam  Left TKA scheduled 07/18/21 with Dr. Rudene Christians    History of the Present Illness: Adriana Sanders is a 81 y.o. female here today for history and physical for left total knee arthroplasty with Dr. Hessie Knows on 07/18/2021. Patient has severe left knee osteoarthritis with slight varus deformity and near complete loss of joint space in the medial compartment with severe sclerotic changes along the tibial plateau and medial femoral condyle. Advanced patellofemoral arthritic changes noted. Patient has failed conservative treatment for her left knee. Her pain interferes with her ability to walk. Her knee will swell, buckle and give way. She has had successful right total knee arthroplasty over the last year. She is doing very well. She is requesting left total knee arthroplasty. Patient has seen Dr. Rudene Christians, discussed total knee arthroplasty, risk benefits and complications and agreed and consented to the procedure  She had a right total knee arthroplasty 1 year ago. She also has a history of hemiarthroplasty of the right hip.  The patient takes Aggrenox for a stroke. She is taking Prozac 20 mg.  The patient's primary care physician is Dr. Kary Kos.  I have reviewed past medical, surgical, social and family history, and allergies as documented in the EMR.  Past Medical History: Past Medical History:  Diagnosis Date   Anxiety   Depression   Diabetes mellitus type 2, uncomplicated (CMS-HCC)   Hyperlipidemia   Hypertension   Panic disorder   Stroke (CMS-HCC) 03/04/1999   Thyroid disease   Past Surgical History: Past Surgical History:  Procedure Laterality Date   Left Hip Hemiarthroplasty Left 04/23/2017  Hessie Knows, MD   ARTHROPLASTY TOTAL KNEE Right 05/22/2020  Dr. Rudene Christians   Past Family History: Family History  Problem Relation Age of Onset   No Known Problems Mother   Medications: Current Outpatient Medications Ordered in Epic   Medication Sig Dispense Refill   ALPRAZolam (XANAX) 1 MG tablet Take 1 tablet (1 mg total) by mouth once daily as needed 90 tablet 0   aspirin 81 MG chewable tablet Take 1 tablet by mouth 2 (two) times daily   aspirin-dipyridamole (AGGRENOX) 25-200 mg ER capsule Take 1 capsule by mouth 2 (two) times daily 180 capsule 3   atorvastatin (LIPITOR) 10 MG tablet Take 1 tablet (10 mg total) by mouth once daily 90 tablet 3   carbidopa-levodopa (SINEMET) 25-100 mg tablet Take 1 tablet by mouth 3 (three) times daily 270 tablet 1   diclofenac (VOLTAREN) 1 % topical gel Apply 4 g topically 4 (four) times daily 300 g 11   FLUoxetine (PROZAC) 20 MG capsule Take 3 capsules (60 mg total) by mouth once daily 270 capsule 1   lisinopriL-hydrochlorothiazide (ZESTORETIC) 20-25 mg tablet Take 1 tablet by mouth once daily 90 tablet 3   metFORMIN (GLUCOPHAGE) 500 MG tablet Take 1 tablet (500 mg total) by mouth 2 (two) times daily 180 tablet 3   methIMAzole (TAPAZOLE) 10 MG tablet TAKE 1 TABLET BY MOUTH EVERY DAY 90 tablet 1   methIMAzole (TAPAZOLE) 5 MG tablet TAKE 1 TABLET BY MOUTH EVERY DAY 90 tablet 3   metoprolol tartrate (LOPRESSOR) 100 MG tablet Take 1 tablet (100 mg total) by mouth 2 (two) times daily with meals 180 tablet 3   traZODone (DESYREL) 100 MG tablet Take 1 tablet (100 mg total) by mouth at bedtime 90 tablet 3   No current Epic-ordered facility-administered medications on file.   Allergies: Allergies  Allergen  Reactions   Acetaminophen Unknown   Hydrocodone Bitartrate Unknown   Vicodin [Hydrocodone-Acetaminophen] Unknown    Body mass index is 33.63 kg/m.  Review of Systems: A comprehensive 14 point ROS was performed, reviewed, and the pertinent orthopaedic findings are documented in the HPI.  Vitals:  07/03/21 1026  BP: 118/72    General Physical Examination:   General:  Well developed, well nourished, no apparent distress, normal affect, [name] in a wheelchair  HEENT: Head  normocephalic, atraumatic, PERRL.   Abdomen: Soft, non tender, non distended, Bowel sounds present.  Heart: Examination of the heart reveals regular, rate, and rhythm. There is no murmur noted on ascultation. There is a normal apical pulse.  Lungs: Lungs are clear to auscultation. There is no wheeze, rhonchi, or crackles. There is normal expansion of bilateral chest walls.   Left knee: On exam, left knee has range of motion of 5 to 115 degrees. Mild venous stasis changes. No warmth or erythema. Varus deformity of the left knee that is not passively correctable. Small left leg pulse. Normal hip range of motion on the left side with no discomfort  Radiographs:  AP, lateral, standing, and sunrise x-rays of the left knee were reviewed by me today from 05/24/2021. These show significant varus deformity, near complete loss of medial joint space with large osteophytes medially, and chondrocalcinosis with varus deformity. Lateral view shows calcifications of the popliteal artery and significant erosion of the posteromedial tibia with large osteophytes posteriorly on the femur and tibia. Sunrise view shows severe patellofemoral arthritis.  X-ray Impression Severe tricompartmental osteoarthritis with deformity and bone loss, left knee.  Assessment: ICD-10-CM  1. Primary osteoarthritis of left knee M17.12   Plan:  67. 81 year old female with advanced left knee osteoarthritis. Pain is severe and limiting her ability to ambulate. Her knee will buckle and give way. She has failed conservative treatment. Risks, benefits, complications of a left total knee arthroplasty have been discussed with the patient. Patient has agreed and consented to procedure with Dr. Hessie Knows on 07/18/2021.  Surgical Risks:  The nature of the condition and the proposed procedure has been reviewed in detail with the patient. Surgical versus non-surgical options and prognosis for recovery have been reviewed and the inherent  risks and benefits of each have been discussed including the risks of infection, bleeding, injury to nerves/blood vessels/tendons, incomplete relief of symptoms, persisting pain and/or stiffness, loss of function, complex regional pain syndrome, failure of the procedure, as appropriate.   Electronically signed by Feliberto Gottron, [place] at 07/03/2021 10:55 AM EDT  Reviewed  H+P. No changes noted.

## 2021-07-18 NOTE — Op Note (Signed)
07/18/2021  9:48 AM  PATIENT:  Adriana Sanders   MRN: [id number]  PRE-OPERATIVE DIAGNOSIS:  Primary localized osteoarthritis of left knee   POST-OPERATIVE DIAGNOSIS:  Same   PROCEDURE:  Procedure(s): Left TOTAL KNEE ARTHROPLASTY   SURGEON: Laurene Footman, MD   ASSISTANTS: Rachelle Hora, PA-C   ANESTHESIA:   spinal   EBL: 100 cc   BLOOD ADMINISTERED:none   DRAINS:  Incisional wound VAC     LOCAL MEDICATIONS USED:  MARCAINE    and OTHER morphine and Exparel   SPECIMEN:  No Specimen   DISPOSITION OF SPECIMEN:  N/A   COUNTS:  YES   TOURNIQUET: 21 minutes at 300 mm Hg   IMPLANTS: Medacta  GMK sphere system with 3+ left femur, 3 left tibia with short stem and 10 mm insert.  Size 1 patella, all components cemented.   DICTATION: Viviann Spare Dictation   patient was brought to the operating room and spinal anesthesia was obtained.  While tourniquet was being applied preop a small skin tear occurred medial and proximal to the incision this was closed with Dermabond and during the procedure a sterile Telfa was placed over this and was dressed as well at the close of the case.  After prepping and draping the left leg in sterile fashion, and after patient identification and timeout procedures were completed, midline skin incision was made followed by medial parapatellar arthrotomy with severe medial compartment osteoarthritis, severe patellofemoral arthritis and moderate lateral compartment arthritis, partial synovectomy was also carried out.   The ACL and PCL and fat pad were excised along with anterior horns of the meniscus. The proximal tibia cutting guide from  the extra medullary system was applied and the proximal tibia cut carried out.  The distal femoral cut was carried out in using intramedullary guide.  After the distal cut was made femoral sizing guide placed and it measured to 3+.     The 3+ femoral cutting guide applied with anterior posterior and chamfer cuts made.  The posterior horns  of the menisci were removed at this point.   Injection of the above medication was carried out after the femoral and tibial cuts were carried out.  The 3 baseplate trial was placed pinned into position and proximal tibial preparation carried out with drilling hand reaming and the keel punch followed by placement of the 3+ femur and sizing the tibial insert size 10 millimeter gave the best fit with stability and full extension.  The distal femoral drill holes were made in the notch cut for the trochlear groove was then carried out with trials were then removed the patella was cut using the patellar cutting guide and it sized to a size 1 after drill holes have been made tourniquet was raised at this point.  The knee was irrigated with pulsatile lavage and the bony surfaces dried the tibial component was cemented into place first.  Excess cement was removed and the polyethylene insert placed with a torque screw placed with a torque screwdriver tightened.  The distal femoral component was placed and the knee was held in extension as the patellar button was clamped into place.  After the cement was set, excess cement was removed and the knee was again irrigated thoroughly thoroughly irrigated.  The tourniquet was let down and hemostasis checked with electrocautery. The arthrotomy was repaired with a heavy Quill suture,  followed by 3-0 V lock subcuticular closure, skin staples followed by incisional wound VAC and Polar Care.[name]   PLAN OF CARE:  Admit for overnight observation   PATIENT DISPOSITION:  PACU - hemodynamically stable.

## 2021-07-18 NOTE — Progress Notes (Signed)
Patient awake/alert x4. Moving bil lower ext, faint sensation, no pain.  SBP with phenylephrine 90's MAP 60's, off phenylephrine 79-80 sbp. U/0 47m thus far. Anesthesia aware, ordered blood work. Patient made aware of latter.

## 2021-07-18 NOTE — Transfer of Care (Signed)
Immediate Anesthesia Transfer of Care Note  Patient: Adriana Sanders  Procedure(s) Performed: TOTAL KNEE ARTHROPLASTY (Left: Knee)  Patient Location: PACU  Anesthesia Type:Spinal  Level of Consciousness: awake and drowsy  Airway & Oxygen Therapy: Patient Spontanous Breathing and Patient connected to nasal cannula oxygen  Post-op Assessment: Report given to RN and Post -op Vital signs reviewed and stable  Post vital signs: Reviewed and stable   First SBP in 60s. Given 52mg phenylephrine. Responded well with SBP in 90s. Began Phenylephrine gtt at 261m/min. VSS and MDA notified.  Last Vitals:  Vitals Value Taken Time  BP 99/50 07/18/21 0950  Temp    Pulse 44 07/18/21 0956  Resp 11 07/18/21 0956  SpO2 100 % 07/18/21 0956    Last Pain:  Vitals:   07/18/21 0600  TempSrc: Oral         Complications: No notable events documented.

## 2021-07-18 NOTE — Progress Notes (Signed)
Patient awake/alert x4. Able to move bil ext, bend bil knee's. C/o's discomfort to left knee, medicated as ordered. Lunch provided 100% intake.  Bp stable off phenylephrine, sbp 90-100's  ok for transfer to ortho floor.

## 2021-07-18 NOTE — Anesthesia Preprocedure Evaluation (Addendum)
Anesthesia Evaluation  Patient identified by MRN, date of birth, ID band Patient awake    Reviewed: Allergy & Precautions, NPO status , Patient's Chart, lab work & pertinent test results, reviewed documented beta blocker date and time   History of Anesthesia Complications Negative for: history of anesthetic complications  Airway Mallampati: II  TM Distance: >3 FB Neck ROM: Full    Dental  (+) Partial Lower, Partial Upper   Pulmonary neg sleep apnea, neg COPD, former smoker,    breath sounds clear to auscultation- rhonchi (-) wheezing      Cardiovascular Exercise Tolerance: Poor hypertension, Pt. on medications and Pt. on home beta blockers + Peripheral Vascular Disease  (-) CAD, (-) Past MI, (-) Cardiac Stents and (-) CABG + dysrhythmias  Rhythm:Regular Rate:Normal - Systolic murmurs and - Diastolic murmurs EKG reviewed from the 8th and today. Similar dysthymia    Neuro/Psych PSYCHIATRIC DISORDERS Anxiety Depression Parkinson's disease  CVA, No Residual Symptoms    GI/Hepatic negative GI ROS, Neg liver ROS,   Endo/Other  diabetes, Well Controlled, Type 2, Oral Hypoglycemic AgentsHyperthyroidism   Renal/GU Renal InsufficiencyRenal disease     Musculoskeletal  (+) Arthritis ,   Abdominal (+) - obese,   Peds  Hematology negative hematology ROS (+)   Anesthesia Other Findings Past Medical History: No date: Anxiety No date: Arthritis     Comment:  both knees No date: Cancer ([place])     Comment:  basal cell of skin, thyroid cancer.  had radiation No date: Depression No date: Diabetes mellitus without complication (HCC) No date: Dysrhythmia     Comment:  skips a beat No date: Hyperlipemia No date: Hypertension No date: Hyperthyroidism No date: Neuromuscular disorder ([place]) No date: Panic disorder No date: Parkinsonism ([place]) 2001: Stroke ([place])   Reproductive/Obstetrics                             Lab Results  Component Value Date   WBC 6.8 05/25/2020   HGB 9.6 (L) 05/25/2020   HCT 28.0 (L) 05/25/2020   MCV 86.4 05/25/2020   PLT 163 05/25/2020    Anesthesia Physical  Anesthesia Plan  ASA: III  Anesthesia Plan: Spinal   Post-op Pain Management:    Induction:   PONV Risk Score and Plan: 2 and Propofol infusion  Airway Management Planned: Natural Airway  Additional Equipment:   Intra-op Plan:   Post-operative Plan:   Informed Consent: I have reviewed the patients History and Physical, chart, labs and discussed the procedure including the risks, benefits and alternatives for the proposed anesthesia with the patient or authorized representative who has indicated his/her understanding and acceptance.     Dental advisory given  Plan Discussed with: CRNA and Anesthesiologist  Anesthesia Plan Comments:        Anesthesia Quick Evaluation

## 2021-07-19 DIAGNOSIS — Z923 Personal history of irradiation: Secondary | ICD-10-CM | POA: Diagnosis not present

## 2021-07-19 DIAGNOSIS — Z8673 Personal history of transient ischemic attack (TIA), and cerebral infarction without residual deficits: Secondary | ICD-10-CM | POA: Diagnosis not present

## 2021-07-19 DIAGNOSIS — Z96651 Presence of right artificial knee joint: Secondary | ICD-10-CM | POA: Diagnosis present

## 2021-07-19 DIAGNOSIS — D62 Acute posthemorrhagic anemia: Secondary | ICD-10-CM | POA: Diagnosis not present

## 2021-07-19 DIAGNOSIS — N390 Urinary tract infection, site not specified: Secondary | ICD-10-CM | POA: Diagnosis present

## 2021-07-19 DIAGNOSIS — F410 Panic disorder [episodic paroxysmal anxiety] without agoraphobia: Secondary | ICD-10-CM | POA: Diagnosis present

## 2021-07-19 DIAGNOSIS — Z7982 Long term (current) use of aspirin: Secondary | ICD-10-CM | POA: Diagnosis not present

## 2021-07-19 DIAGNOSIS — I10 Essential (primary) hypertension: Secondary | ICD-10-CM | POA: Diagnosis present

## 2021-07-19 DIAGNOSIS — E785 Hyperlipidemia, unspecified: Secondary | ICD-10-CM | POA: Diagnosis present

## 2021-07-19 DIAGNOSIS — Z96641 Presence of right artificial hip joint: Secondary | ICD-10-CM | POA: Diagnosis present

## 2021-07-19 DIAGNOSIS — G20 Parkinson's disease: Secondary | ICD-10-CM | POA: Diagnosis present

## 2021-07-19 DIAGNOSIS — Z79899 Other long term (current) drug therapy: Secondary | ICD-10-CM | POA: Diagnosis not present

## 2021-07-19 DIAGNOSIS — Z888 Allergy status to other drugs, medicaments and biological substances status: Secondary | ICD-10-CM | POA: Diagnosis not present

## 2021-07-19 DIAGNOSIS — F32A Depression, unspecified: Secondary | ICD-10-CM | POA: Diagnosis present

## 2021-07-19 DIAGNOSIS — Z7902 Long term (current) use of antithrombotics/antiplatelets: Secondary | ICD-10-CM | POA: Diagnosis not present

## 2021-07-19 DIAGNOSIS — M1712 Unilateral primary osteoarthritis, left knee: Secondary | ICD-10-CM | POA: Diagnosis present

## 2021-07-19 DIAGNOSIS — Z885 Allergy status to narcotic agent status: Secondary | ICD-10-CM | POA: Diagnosis not present

## 2021-07-19 DIAGNOSIS — E1151 Type 2 diabetes mellitus with diabetic peripheral angiopathy without gangrene: Secondary | ICD-10-CM | POA: Diagnosis present

## 2021-07-19 DIAGNOSIS — M810 Age-related osteoporosis without current pathological fracture: Secondary | ICD-10-CM | POA: Diagnosis present

## 2021-07-19 DIAGNOSIS — F419 Anxiety disorder, unspecified: Secondary | ICD-10-CM | POA: Diagnosis present

## 2021-07-19 DIAGNOSIS — Z7984 Long term (current) use of oral hypoglycemic drugs: Secondary | ICD-10-CM | POA: Diagnosis not present

## 2021-07-19 DIAGNOSIS — Z85850 Personal history of malignant neoplasm of thyroid: Secondary | ICD-10-CM | POA: Diagnosis not present

## 2021-07-19 DIAGNOSIS — Z972 Presence of dental prosthetic device (complete) (partial): Secondary | ICD-10-CM | POA: Diagnosis not present

## 2021-07-19 DIAGNOSIS — Z85828 Personal history of other malignant neoplasm of skin: Secondary | ICD-10-CM | POA: Diagnosis not present

## 2021-07-19 LAB — BASIC METABOLIC PANEL
Anion gap: 8 (ref 5–15)
BUN: 15 mg/dL (ref 8–23)
CO2: 22 mmol/L (ref 22–32)
Calcium: 8.4 mg/dL — ABNORMAL LOW (ref 8.9–10.3)
Chloride: 104 mmol/L (ref 98–111)
Creatinine, Ser: 0.91 mg/dL (ref 0.44–1.00)
GFR, Estimated: >60 mL/min (ref >60)
Glucose, Bld: 171 mg/dL — ABNORMAL HIGH (ref 70–99)
Potassium: 3.5 mmol/L (ref 3.5–5.1)
Sodium: 134 mmol/L — ABNORMAL LOW (ref 135–145)

## 2021-07-19 LAB — URINALYSIS, ROUTINE W REFLEX MICROSCOPIC
Bacteria, UA: NONE SEEN
Bilirubin Urine: NEGATIVE
Glucose, UA: NEGATIVE mg/dL
Hgb urine dipstick: NEGATIVE
Ketones, ur: NEGATIVE mg/dL
Nitrite: NEGATIVE
Protein, ur: NEGATIVE mg/dL
Specific Gravity, Urine: 1.015 (ref 1.005–1.030)
pH: 5 (ref 5.0–8.0)

## 2021-07-19 LAB — CBC
HCT: 32.1 % — ABNORMAL LOW (ref 36.0–46.0)
Hemoglobin: 10 g/dL — ABNORMAL LOW (ref 12.0–15.0)
MCH: 29.3 pg (ref 26.0–34.0)
MCHC: 31.2 g/dL (ref 30.0–36.0)
MCV: 94.1 fL (ref 80.0–100.0)
Platelets: 171 10*3/uL (ref 150–400)
RBC: 3.41 MIL/uL — ABNORMAL LOW (ref 3.87–5.11)
RDW: 13.5 % (ref 11.5–15.5)
WBC: 6.6 10*3/uL (ref 4.0–10.5)
nRBC: 0 % (ref 0.0–0.2)

## 2021-07-19 LAB — GLUCOSE, CAPILLARY
Glucose-Capillary: 143 mg/dL — ABNORMAL HIGH (ref 70–99)
Glucose-Capillary: 124 mg/dL — ABNORMAL HIGH (ref 70–99)
Glucose-Capillary: 123 mg/dL — ABNORMAL HIGH (ref 70–99)
Glucose-Capillary: 82 mg/dL (ref 70–99)

## 2021-07-19 MED ORDER — ACETAMINOPHEN 500 MG PO TABS
1000.0000 mg | ORAL_TABLET | Freq: Four times a day (QID) | ORAL | Status: DC
  Administered 2021-07-19 – 2021-07-23 (×17): 1000 mg via ORAL
  Filled 2021-07-19 (×19): qty 2

## 2021-07-19 MED ORDER — KETOROLAC TROMETHAMINE 30 MG/ML IJ SOLN
30.0000 mg | Freq: Once | INTRAMUSCULAR | Status: AC
  Administered 2021-07-19: 30 mg via INTRAVENOUS
  Filled 2021-07-19: qty 1

## 2021-07-19 MED ORDER — OXYCODONE HCL 5 MG PO TABS: 2.5000 mg | ORAL_TABLET | Freq: Four times a day (QID) | ORAL | Status: DC | PRN

## 2021-07-19 MED ORDER — OXYCODONE HCL 5 MG PO TABS
2.5000 mg | ORAL_TABLET | Freq: Four times a day (QID) | ORAL | Status: DC | PRN
  Administered 2021-07-19: 2.5 mg via ORAL
  Administered 2021-07-20 – 2021-07-21 (×4): 5 mg via ORAL
  Filled 2021-07-19 (×5): qty 1

## 2021-07-19 NOTE — Progress Notes (Signed)
1435 Pt has voided. Will send urine sample down. Verbal order with readback for '30mg'$  of IV toradol x1 dose per Dorise Hiss PA

## 2021-07-19 NOTE — NC FL2 (Signed)
[place] LEVEL OF CARE SCREENING TOOL     IDENTIFICATION  Patient Name: Adriana Sanders Birthdate: 08/27/1940 Sex: female Admission Date (Current Location): 07/18/2021  [facility] and [place] Number:  Engineering geologist and Address:  [facility], [address] [place], [place] [address]      Provider Number: [id number]  Attending Physician Name and Address:  Hessie Knows, MD  Relative Name and Phone Number:  Otho Perl [phone]    Current Level of Care: Hospital Recommended Level of Care: [place] Prior Approval Number:    Date Approved/Denied:   PASRR Number: [id number]  Discharge Plan: SNF    Current Diagnoses: Patient Active Problem List   Diagnosis Date Noted   S/P TKR (total knee replacement) using cement, left 07/18/2021   S/P TKR (total knee replacement) using cement, right 05/22/2020   Leg pain 01/14/2019   PAD (peripheral artery disease) ([place]) 01/14/2019   Anxiety 01/10/2019   Depression 01/10/2019   Diabetes mellitus type 2, uncomplicated ([place]) 27/51/7001   Hyperlipidemia 01/10/2019   Hypertension 01/10/2019   Panic disorder 01/10/2019   Stroke ([place]) 01/10/2019   Parkinson disease ([place]) 01/10/2019   S/P hip hemiarthroplasty 06/13/2017   Age-related osteoporosis with current pathological fracture with routine healing 04/27/2017   Closed left hip fracture ([place]) 04/22/2017    Orientation RESPIRATION BLADDER Height & Weight     Self, Time, Situation, Place  Normal Continent, External catheter Weight: 72.6 kg Height:  '5\' 4"'$  (162.6 cm)  BEHAVIORAL SYMPTOMS/MOOD NEUROLOGICAL BOWEL NUTRITION STATUS      Continent Diet (see DC summary)  AMBULATORY STATUS COMMUNICATION OF NEEDS Skin   Extensive Assist   Normal, Surgical wounds                       Personal Care Assistance Level of Assistance  Bathing, Feeding, Dressing Bathing Assistance: Maximum assistance Feeding assistance: Limited  assistance Dressing Assistance: Maximum assistance     Functional Limitations Info             SPECIAL CARE FACTORS FREQUENCY  PT (By licensed PT), OT (By licensed OT)     PT Frequency: 5 times per week OT Frequency: 5 times per week            Contractures Contractures Info: Not present    Additional Factors Info  Code Status, Allergies Code Status Info: FUll code Allergies Info: Vicodin           Current Medications (07/19/2021):  This is the current hospital active medication list Current Facility-Administered Medications  Medication Dose Route Frequency Provider Last Rate Last Admin   0.9 %  sodium chloride infusion   Intravenous Continuous Arita Miss, MD   Stopped at 07/18/21 1900   0.9 %  sodium chloride infusion   Intravenous Continuous Hessie Knows, MD 75 mL/hr at 07/19/21 0413 Infusion Verify at 07/19/21 0413   acetaminophen (TYLENOL) tablet 1,000 mg  1,000 mg Oral Q6H Duanne Guess, PA-C   1,000 mg at 07/19/21 7494   ALPRAZolam (XANAX) tablet 1 mg  1 mg Oral Daily PRN Hessie Knows, MD       alum & mag hydroxide-simeth (MAALOX/MYLANTA) 200-200-20 MG/5ML suspension 30 mL  30 mL Oral Q4H PRN Hessie Knows, MD       atorvastatin (LIPITOR) tablet 10 mg  10 mg Oral Daily Hessie Knows, MD   10 mg at 07/19/21 0928   bisacodyl (DULCOLAX) EC tablet 5 mg  5  mg Oral Daily PRN Hessie Knows, MD       cholecalciferol (VITAMIN D3) tablet 4,000 Units  4,000 Units Oral Daily Hessie Knows, MD   4,000 Units at 07/19/21 6063   diphenhydrAMINE (BENADRYL) 12.5 MG/5ML elixir 12.5-25 mg  12.5-25 mg Oral Q4H PRN Hessie Knows, MD       dipyridamole-aspirin (AGGRENOX) 200-25 MG per 12 hr capsule 1 capsule  1 capsule Oral BID Hessie Knows, MD   1 capsule at 07/19/21 0929   docusate sodium (COLACE) capsule 100 mg  100 mg Oral BID Hessie Knows, MD   100 mg at 07/19/21 0928   enoxaparin (LOVENOX) injection 30 mg  30 mg Subcutaneous Q12H Hessie Knows, MD   30 mg at 07/19/21 0160    FLUoxetine (PROZAC) capsule 40 mg  40 mg Oral Daily Hessie Knows, MD   40 mg at 07/19/21 0929   fluticasone (FLONASE) 50 MCG/ACT nasal spray 2 spray  2 spray Each Nare Daily PRN Hessie Knows, MD       lisinopril (ZESTRIL) tablet 20 mg  20 mg Oral Daily Hessie Knows, MD   20 mg at 07/19/21 1093   And   hydrochlorothiazide (HYDRODIURIL) tablet 25 mg  25 mg Oral Daily Hessie Knows, MD   25 mg at 07/19/21 2355   HYDROmorphone (DILAUDID) injection 0.5-1 mg  0.5-1 mg Intravenous Q4H PRN Hessie Knows, MD   1 mg at 07/19/21 0548   insulin aspart (novoLOG) injection 0-15 Units  0-15 Units Subcutaneous TID WC Hessie Knows, MD   2 Units at 07/19/21 0929   loratadine (CLARITIN) tablet 10 mg  10 mg Oral Daily Hessie Knows, MD   10 mg at 07/19/21 7322   menthol-cetylpyridinium (CEPACOL) lozenge 3 mg  1 lozenge Oral PRN Hessie Knows, MD       Or   phenol (CHLORASEPTIC) mouth spray 1 spray  1 spray Mouth/Throat PRN Hessie Knows, MD       metFORMIN (GLUCOPHAGE) tablet 500 mg  500 mg Oral BID WC Hessie Knows, MD   500 mg at 07/19/21 0254   methimazole (TAPAZOLE) tablet 10 mg  10 mg Oral Daily Hessie Knows, MD   10 mg at 07/19/21 0929   methocarbamol (ROBAXIN) tablet 500 mg  500 mg Oral Q6H PRN Hessie Knows, MD   500 mg at 07/19/21 0547   Or   methocarbamol (ROBAXIN) 500 mg in dextrose 5 % 50 mL IVPB  500 mg Intravenous Q6H PRN Hessie Knows, MD   Stopped at 07/18/21 1900   metoCLOPramide (REGLAN) tablet 5-10 mg  5-10 mg Oral Q8H PRN Hessie Knows, MD       Or   metoCLOPramide (REGLAN) injection 5-10 mg  5-10 mg Intravenous Q8H PRN Hessie Knows, MD       metoprolol tartrate (LOPRESSOR) tablet 100 mg  100 mg Oral BID WC Hessie Knows, MD   100 mg at 07/19/21 0928   ondansetron (ZOFRAN) tablet 4 mg  4 mg Oral Q6H PRN Hessie Knows, MD       Or   ondansetron [facility]) injection 4 mg  4 mg Intravenous Q6H PRN Hessie Knows, MD       oxyCODONE (Oxy IR/ROXICODONE) immediate release tablet 2.5-5 mg  2.5-5  mg Oral Q6H PRN Duanne Guess, PA-C       pantoprazole (PROTONIX) EC tablet 40 mg  40 mg Oral Daily Hessie Knows, MD   40 mg at 07/19/21 0928   pioglitazone (ACTOS) tablet 15 mg  15 mg  Oral Daily Hessie Knows, MD   15 mg at 07/19/21 3710   polyvinyl alcohol (LIQUIFILM TEARS) 1.4 % ophthalmic solution 1 drop  1 drop Both Eyes Daily PRN Hessie Knows, MD       senna-docusate (Senokot-S) tablet 1 tablet  1 tablet Oral QHS PRN Hessie Knows, MD       sodium phosphate (FLEET) 7-19 GM/118ML enema 1 enema  1 enema Rectal Once PRN Hessie Knows, MD       traZODone (DESYREL) tablet 100 mg  100 mg Oral QHS Hessie Knows, MD   100 mg at 07/18/21 2204   vitamin B-12 (CYANOCOBALAMIN) tablet 500 mcg  500 mcg Oral Daily Hessie Knows, MD   500 mcg at 07/19/21 6269   zolpidem (AMBIEN) tablet 5 mg  5 mg Oral QHS PRN Hessie Knows, MD         Discharge Medications: Please see discharge summary for a list of discharge medications.  Relevant Imaging Results:  Relevant Lab Results:   Additional Information SS#: [id number]. Pfizer 05/05/19 and 05/27/19. No booster.  Conception Oms, RN

## 2021-07-19 NOTE — Evaluation (Signed)
Occupational Therapy Evaluation Patient Details Name: Adriana Sanders MRN: [id number] DOB: Sep 07, 1940 Today's Date: 07/19/2021   History of Present Illness Patient is an 81 year old female s/p left total knee arthroplasty   Clinical Impression   Patient presenting with decreased Ind in self care,balance, functional mobility/transfers, endurance, and safety awareness. Patient reports being mod I with self care and mobility at baseline but does need occasional help with self care from daughter when having a bad day. She lives with her daughter who is able to assist her when she gets home from work.  Patient is very pain limited this session and reports 7/10 pain while in bed. OT attempting bed mobility with mod - max A and pt declined OOB activity. OT encourages pt to eat lunch while taking pain medications so she will not become nauseated. She is able to open all containers herself. Patient will benefit from acute OT to increase overall independence in the areas of ADLs, functional mobility, and safety awareness in order to safely discharge to next venue of care.      Recommendations for follow up therapy are one component of a multi-disciplinary discharge planning process, led by the attending physician.  Recommendations may be updated based on patient status, additional functional criteria and insurance authorization.   Follow Up Recommendations  Skilled nursing-short term rehab (<3 hours/day)    Assistance Recommended at Discharge Frequent or constant Supervision/Assistance  Patient can return home with the following A lot of help with walking and/or transfers;A lot of help with bathing/dressing/bathroom;Assistance with cooking/housework;Assist for transportation;Help with stairs or ramp for entrance    Functional Status Assessment  Patient has had a recent decline in their functional status and demonstrates the ability to make significant improvements in function in a reasonable and predictable  amount of time.  Equipment Recommendations  Other (comment) (defer to next venue of care)       Precautions / Restrictions Precautions Precautions: Fall Restrictions Weight Bearing Restrictions: Yes LLE Weight Bearing: Weight bearing as tolerated      Mobility Bed Mobility Overal bed mobility: Needs Assistance Bed Mobility: Rolling Rolling: Mod assist, Max assist              Transfers                   General transfer comment: declined secondary to increased pain      Balance Overall balance assessment: Needs assistance Sitting-balance support: Bilateral upper extremity supported, Feet supported Sitting balance-Leahy Scale: Fair     Standing balance support: During functional activity, Reliant on assistive device for balance, Bilateral upper extremity supported Standing balance-Leahy Scale: Poor                             ADL either performed or assessed with clinical judgement   ADL Overall ADL's : Needs assistance/impaired Eating/Feeding: Modified independent   Grooming: Wash/dry hands;Wash/dry face;Bed level;Set up;Supervision/safety                                       Vision Patient Visual Report: No change from baseline              Pertinent Vitals/Pain Pain Assessment Pain Assessment: 0-10 Pain Score: 7  Pain Location: L knee Pain Descriptors / Indicators: Discomfort, Aching Pain Intervention(s): Monitored during session, Limited activity within patient's tolerance, Repositioned  Hand Dominance Right   Extremity/Trunk Assessment Upper Extremity Assessment Upper Extremity Assessment: Generalized weakness   Lower Extremity Assessment Lower Extremity Assessment: Defer to PT evaluation       Communication Communication Communication: No difficulties   Cognition Arousal/Alertness: Awake/alert Behavior During Therapy: WFL for tasks assessed/performed Overall Cognitive Status: Within Functional  Limits for tasks assessed                                 General Comments: Pt is pleasant and cooperative but slow to process during session                [place] expects to be discharged to:: Private residence Living Arrangements: Children Available Help at Discharge: Family;Available PRN/intermittently Type of Home: House Home Access: Stairs to enter [employer] of Steps: 2 Entrance Stairs-Rails: Right Home Layout: Two level;1/2 bath on main level Alternate Level Stairs-Number of Steps: 12 Alternate Level Stairs-Rails: Right Bathroom Shower/Tub: Tub/shower unit;Curtain   Bathroom Toilet: Standard     Home Equipment: Conservation officer, nature (2 wheels);BSC/3in1;Cane - single point          Prior Functioning/Environment Prior Level of Function : Independent/Modified Independent;Needs assist             Mobility Comments: ambulates with a RW at baseline ADLs Comments: assistance intermittently from daughter        OT Problem List: Decreased strength;Cardiopulmonary status limiting activity;Decreased activity tolerance;Decreased safety awareness;Impaired balance (sitting and/or standing);Decreased knowledge of use of DME or AE      OT Treatment/Interventions: Self-care/ADL training;Balance training;Therapeutic exercise;Therapeutic activities;Energy conservation;DME and/or AE instruction;Visual/perceptual remediation/compensation;Patient/family education    OT Goals(Current goals can be found in the care plan section) Acute Rehab OT Goals Patient Stated Goal: to goto rehab OT Goal Formulation: With patient Time For Goal Achievement: 08/02/21 Potential to Achieve Goals: Fair ADL Goals Pt Will Perform Grooming: with min guard assist;standing Pt Will Perform Lower Body Dressing: with min assist;sit to/from stand Pt Will Transfer to Toilet: with min guard assist;ambulating Pt Will Perform Toileting - Clothing Manipulation and  hygiene: with min assist;sit to/from stand  OT Frequency: Min 2X/week       AM-PAC OT "6 Clicks" Daily Activity     Outcome Measure Help from another person eating meals?: None Help from another person taking care of personal grooming?: A Little Help from another person toileting, which includes using toliet, bedpan, or urinal?: A Lot Help from another person bathing (including washing, rinsing, drying)?: A Lot Help from another person to put on and taking off regular upper body clothing?: A Little Help from another person to put on and taking off regular lower body clothing?: A Lot 6 Click Score: 16   End of Session    Activity Tolerance: Patient limited by pain Patient left: in bed;with bed alarm set;with call bell/phone within reach  OT Visit Diagnosis: Unsteadiness on feet (R26.81);Muscle weakness (generalized) (M62.81);History of falling (Z91.81)                Time: 9767-3419 OT Time Calculation (min): 15 min Charges:  OT General Charges $OT Visit: 1 Visit OT Evaluation $OT Eval Moderate Complexity: 1 [address] [place], OTR/L , CBIS ascom [phone]  07/19/21, 4:02 PM

## 2021-07-19 NOTE — Progress Notes (Signed)
Physical Therapy Treatment Patient Details Name: Adriana Sanders MRN: [id number] DOB: 25-Nov-1940 Today's Date: 07/19/2021   History of Present Illness Patient is an 81 year old female s/p left total knee arthroplasty    PT Comments    Physical Therapy session completed this date. Patient tolerated session poorly due to increased knee pain compared to yesterdays session. Patient also demonstrated mild confusion with disorientation to situation. Patient's face demonstrated at least a 6/10 L knee pain. Upon entering room patient reported needed to go to the bathroom. Due to increased pain, patient demonstrated increased time and difficulty initiating movement (on-top of increased time and effort due to patient's PD). Mod-Max A required for all bed mobility. Patient continues to require Min A (for RW navigation) and tactile cueing to LLE and BUEs to promote movement and proper hand positioning with sit to stands. Patient took ~7 steps when completing step pivot transfer to/from [facility] with RW at CGA/MIN A. NT called to change patient's sheets due to them being soiled. Bring and review HEP packet next session. Patient is progressing towards her goals and would continue to benefit from skilled physical therapy in order to optimize patient's return to PLOF. Continue to recommend STR upon discharge from acute hospitalization.   Recommendations for follow up therapy are one component of a multi-disciplinary discharge planning process, led by the attending physician.  Recommendations may be updated based on patient status, additional functional criteria and insurance authorization.  Follow Up Recommendations  Skilled nursing-short term rehab (<3 hours/day)     Assistance Recommended at Discharge Frequent or constant Supervision/Assistance  Patient can return home with the following A little help with walking and/or transfers;A little help with bathing/dressing/bathroom;Help with stairs or ramp for entrance    Equipment Recommendations  Other (comment) (TBD)    Recommendations for Other Services       Precautions / Restrictions Precautions Precautions: Fall Restrictions Weight Bearing Restrictions: Yes LLE Weight Bearing: Weight bearing as tolerated     Mobility  Bed Mobility Overal bed mobility: Needs Assistance Bed Mobility: Supine to Sit, Sit to Supine     Supine to sit: Mod assist Sit to supine: Max assist        Transfers Overall transfer level: Needs assistance Equipment used: Rolling walker (2 wheels) Transfers: Sit to/from Stand Sit to Stand: Min assist, From elevated surface           General transfer comment: cueing on hand placement, increased time and effort to complete    Ambulation/Gait Ambulation/Gait assistance: Min guard, Min assist Gait Distance (Feet): 7 Feet Assistive device: Rolling walker (2 wheels) Gait Pattern/deviations: Step-to pattern Gait velocity: decreased     General Gait Details: Min A with RW navigation, tactile cueing on LLE to promote initiation of movement, increased time to initiation movement due to PD   Stairs             Wheelchair Mobility    Modified Rankin (Stroke Patients Only)       Balance Overall balance assessment: Needs assistance Sitting-balance support: Bilateral upper extremity supported, Feet supported Sitting balance-Leahy Scale: Fair     Standing balance support: During functional activity, Reliant on assistive device for balance, Bilateral upper extremity supported Standing balance-Leahy Scale: Poor                              Cognition Arousal/Alertness: Awake/alert Behavior During Therapy: WFL for tasks assessed/performed Overall Cognitive Status: Within Functional Limits  for tasks assessed                                 General Comments: Alert and oriented to self and location, disoriented to situation        Exercises      General Comments         Pertinent Vitals/Pain Pain Assessment Pain Assessment: Faces Faces Pain Scale: Hurts even more Pain Location: L knee Pain Descriptors / Indicators: Discomfort, Aching Pain Intervention(s): Monitored during session, Limited activity within patient's tolerance, Repositioned    Home Living                          Prior Function            PT Goals (current goals can now be found in the care plan section) Acute Rehab PT Goals Patient Stated Goal: patient did not state a goal PT Goal Formulation: With patient Time For Goal Achievement: 08/01/21 Potential to Achieve Goals: Fair Additional Goals Additional Goal #1: Patient will demonstrate all bed mobility at Mod I to increase ease and independence with all bed mobility transfers    Frequency    BID      PT Plan Current plan remains appropriate    Co-evaluation              AM-PAC PT "6 Clicks" Mobility   Outcome Measure  Help needed turning from your back to your side while in a flat bed without using bedrails?: A Lot Help needed moving from lying on your back to sitting on the side of a flat bed without using bedrails?: A Lot Help needed moving to and from a bed to a chair (including a wheelchair)?: A Lot Help needed standing up from a chair using your arms (e.g., wheelchair or bedside chair)?: A Lot Help needed to walk in hospital room?: A Lot Help needed climbing 3-5 steps with a railing? : A Lot 6 Click Score: 12    End of Session Equipment Utilized During Treatment: Gait belt Activity Tolerance: Patient tolerated treatment well Patient left: in bed;with call bell/phone within reach;with bed alarm set;with family/visitor present;with nursing/sitter in room Nurse Communication: Mobility status PT Visit Diagnosis: Unsteadiness on feet (R26.81);Muscle weakness (generalized) (M62.81);Pain Pain - Right/Left: Left Pain - part of body: Knee     Time: 3790-2409 PT Time Calculation (min) (ACUTE  ONLY): 20 min  Charges:  $Therapeutic Activity: 8-22 mins                     Iva Boop, PT  07/19/21. 10:17 AM

## 2021-07-19 NOTE — Anesthesia Postprocedure Evaluation (Signed)
Anesthesia Post Note  Patient: Adriana Sanders  Procedure(s) Performed: TOTAL KNEE ARTHROPLASTY (Left: Knee)  Patient location during evaluation: PACU Anesthesia Type: Spinal Level of consciousness: awake and alert Pain management: pain level controlled Vital Signs Assessment: post-procedure vital signs reviewed and stable Respiratory status: spontaneous breathing, nonlabored ventilation and respiratory function stable Cardiovascular status: blood pressure returned to baseline and stable Postop Assessment: no apparent nausea or vomiting Anesthetic complications: no Comments: Prolonged pacu stay for hypotension on a phenylephrine drip. Spinal resolved. IVF administered. Bedside echo and physical exam with signs of hypovolemia. Pt with no complaints.    No notable events documented.   Last Vitals:  Vitals:   07/18/21 2322 07/19/21 0418  BP: 136/64 133/67  Pulse: 62 62  Resp: 18 20  Temp: 37.2 C 37.1 C  SpO2: 96% 93%    Last Pain:  Vitals:   07/19/21 0610  TempSrc:   PainSc: [place]

## 2021-07-19 NOTE — TOC Progression Note (Signed)
Transition of Care [facility]) - Progression Note    Patient Details  Name: Adriana Sanders MRN: [id number] Date of Birth: [birth year]  Transition of [facility]) CM/SW [place], RN Phone Number: 07/19/2021, 11:18 AM  Clinical Narrative:     Spoke with the patient and her daughter I explained that she is under Observation but will be changed to Inpatient, There has to be a 3 night stay to go to STR, Bedsearch sent, FL2 completed, PASSR obtained  Expected Discharge Plan: Skilled Nursing Facility Barriers to Discharge: Continued Medical Work up, SNF Pending bed offer  Expected Discharge Plan and Services Expected Discharge Plan: [place]                                               Social Determinants of Health (SDOH) Interventions    Readmission Risk Interventions     View : No data to display.

## 2021-07-19 NOTE — Progress Notes (Signed)
   Subjective: 1 Day Post-Op Procedure(s) (LRB): TOTAL KNEE ARTHROPLASTY (Left) Patient reports pain as mild and moderate.   Patient is with altered mental status this morning.  Alert to place only. Denies any CP, SOB, ABD pain. We will continue therapy today.  Plan is to go Skilled nursing facility after hospital stay.  Objective: Vital signs in last 24 hours: Temp:  [97.1 F (36.2 C)-98.9 F (37.2 C)] 98.7 F (37.1 C) (05/19 0418) Pulse Rate:  [41-62] 62 (05/19 0418) Resp:  [10-20] 20 (05/19 0418) BP: (67-136)/(33-80) 133/67 (05/19 0418) SpO2:  [93 %-100 %] 93 % (05/19 0418)  Intake/Output from previous day: 05/18 0701 - 05/19 0700 In: 2594.1 [P.O.:225; I.V.:2169.1; IV Piggyback:200] Out: 1100 [Urine:1075; Blood:25] Intake/Output this shift: No intake/output data recorded.  Recent Labs    07/18/21 1206 07/19/21 0611  HGB 10.7* 10.0*   Recent Labs    07/18/21 1206 07/19/21 0611  WBC 5.1 6.6  RBC 3.70* 3.41*  HCT 34.6* 32.1*  PLT 151 171   Recent Labs    07/18/21 1206 07/19/21 0611  NA 135 134*  K 4.2 3.5  CL 106 104  CO2 21* 22  BUN 21 15  CREATININE 1.21* 0.91  GLUCOSE 114* 171*  CALCIUM 8.3* 8.4*   No results for input(s): LABPT, INR in the last 72 hours.  EXAM General - Patient is Alert and Confused Extremity - Neurovascular intact Sensation intact distally Intact pulses distally Dorsiflexion/Plantar flexion intact Dressing - dressing C/D/I and no drainage Motor Function - intact, moving foot and toes well on exam.   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   Weight as of this encounter: 72.6 kg. Advance diet Up with therapy Pain well controlled.  Patient slept well last night. Altered mental status -we will check urinalysis.  Patient with history of right total knee arthroplasty, suffered cognitive issues with narcotics, will limit narcotics today and stick with Tylenol. Vital signs are stable Care management to assist with discharge to skilled nursing facility  DVT Prophylaxis - Lovenox, TED hose, and SCDs Weight-Bearing as tolerated to left leg   T. Rachelle Hora, PA-C [place] 07/19/2021, 7:37 AM

## 2021-07-19 NOTE — Progress Notes (Signed)
Physical Therapy Treatment Patient Details Name: Adriana Sanders MRN: [id number] DOB: 1940/07/27 Today's Date: 07/19/2021   History of Present Illness Patient is an 81 year old female s/p left total knee arthroplasty    PT Comments    Pt received in supine position and agreeable to therapy.  Pt is having difficulty moving LE and has the knee bent due to being slightly turned sideways.  Pt educated on importance of leaving the knee straight.  Pt notes she has not had any pain meds prior to arrival, only tylenol due to drowsiness.  Pt participating in HEP in supine position.  Pt requiring significant assistance for L LE mobility.  Pt required assistance to transfer to the side of the bed.  Nurse staff arrives to room to attempt to get a urine sample and assisted with transferring pt to [facility] as patient was stating that she needed to void.  Pt requiring verbal cues to perform transfers due to grasping onto therapists arms or sink counter instead of utilizing the walker.  Pt then transferred back to bed after voiding.  Pt unable to ambulate with good confidence due to pain and safety at this time.  Current discharge plans to SNF remain appropriate at this time.  Pt will continue to benefit from skilled therapy in order to address deficits listed below.    Recommendations for follow up therapy are one component of a multi-disciplinary discharge planning process, led by the attending physician.  Recommendations may be updated based on patient status, additional functional criteria and insurance authorization.  Follow Up Recommendations  Skilled nursing-short term rehab (<3 hours/day)     Assistance Recommended at Discharge Frequent or constant Supervision/Assistance  Patient can return home with the following A little help with walking and/or transfers;A little help with bathing/dressing/bathroom;Help with stairs or ramp for entrance   Equipment Recommendations  Other (comment) (TBD)    Recommendations  for Other Services       Precautions / Restrictions Precautions Precautions: Fall Restrictions Weight Bearing Restrictions: Yes LLE Weight Bearing: Weight bearing as tolerated     Mobility  Bed Mobility Overal bed mobility: Needs Assistance Bed Mobility: Rolling     Supine to sit: Mod assist Sit to supine: Max assist   General bed mobility comments: increased use of bed hand rails to complete transfer, maxA for navigation of LE's back into bed.    Transfers Overall transfer level: Needs assistance Equipment used: Rolling walker (2 wheels) Transfers: Sit to/from Stand Sit to Stand: Mod assist           General transfer comment: transferred to [facility] for voiding.    Ambulation/Gait         Gait velocity: decreased     General Gait Details: pt unable to participate in full gait training and only able to side-step at EOB and pivot towards BSC.   Stairs             Wheelchair Mobility    Modified Rankin (Stroke Patients Only)       Balance Overall balance assessment: Needs assistance Sitting-balance support: Bilateral upper extremity supported, Feet supported Sitting balance-Leahy Scale: Fair     Standing balance support: During functional activity, Reliant on assistive device for balance, Bilateral upper extremity supported Standing balance-Leahy Scale: Poor                              Cognition Arousal/Alertness: Awake/alert Behavior During Therapy: WFL for tasks  assessed/performed Overall Cognitive Status: Within Functional Limits for tasks assessed                                 General Comments: Pt is pleasant and cooperative but slow to process during session        Exercises      General Comments        Pertinent Vitals/Pain Pain Assessment Pain Assessment: 0-10 Pain Score: 8  Pain Location: L knee Pain Descriptors / Indicators: Discomfort, Aching Pain Intervention(s): Limited activity within  patient's tolerance, Monitored during session, Repositioned, Patient requesting pain meds-RN notified    Home Living Family/patient expects to be discharged to:: Private residence Living Arrangements: Children Available Help at Discharge: Family;Available PRN/intermittently Type of Home: House Home Access: Stairs to enter Entrance Stairs-Rails: Right Entrance Stairs-Number of Steps: 2 Alternate Level Stairs-Number of Steps: 12 Home Layout: Two level;1/2 bath on main level Home Equipment: Conservation officer, nature (2 wheels);BSC/3in1;Cane - single point      Prior Function            PT Goals (current goals can now be found in the care plan section) Acute Rehab PT Goals Patient Stated Goal: patient did not state a goal PT Goal Formulation: With patient Time For Goal Achievement: 08/01/21 Potential to Achieve Goals: Fair Additional Goals Additional Goal #1: Patient will demonstrate all bed mobility at Mod I to increase ease and independence with all bed mobility transfers Progress towards PT goals: Progressing toward goals    Frequency    BID      PT Plan Current plan remains appropriate    Co-evaluation              AM-PAC PT "6 Clicks" Mobility   Outcome Measure  Help needed turning from your back to your side while in a flat bed without using bedrails?: A Lot Help needed moving from lying on your back to sitting on the side of a flat bed without using bedrails?: A Lot Help needed moving to and from a bed to a chair (including a wheelchair)?: A Lot Help needed standing up from a chair using your arms (e.g., wheelchair or bedside chair)?: A Lot Help needed to walk in hospital room?: A Lot Help needed climbing 3-5 steps with a railing? : A Lot 6 Click Score: 12    End of Session Equipment Utilized During Treatment: Gait belt Activity Tolerance: Patient tolerated treatment well Patient left: in bed;with call bell/phone within reach;with bed alarm set;with family/visitor  present;with nursing/sitter in room Nurse Communication: Mobility status PT Visit Diagnosis: Unsteadiness on feet (R26.81);Muscle weakness (generalized) (M62.81);Pain Pain - Right/Left: Left Pain - part of body: Knee     Time: 8022-3361 PT Time Calculation (min) (ACUTE ONLY): 44 min  Charges:  $Therapeutic Exercise: 8-22 mins $Therapeutic Activity: 23-37 mins                     Gwenlyn Saran, PT, DPT 07/19/21, 3:34 PM    Adriana Sanders 07/19/2021, 3:26 PM

## 2021-07-19 NOTE — TOC Progression Note (Signed)
Transition of Care [facility]) - Progression Note    Patient Details  Name: Adriana Sanders MRN: [id number] Date of Birth: 1940/11/19  Transition of Care [facility]) CM/SW [place], RN Phone Number: 07/19/2021, 2:36 PM  Clinical Narrative:    Called the patient's daughter tracy to review the bed offers, left a general Voice mail for a call back   Expected Discharge Plan: [place] Barriers to Discharge: Continued Medical Work up, SNF Pending bed offer  Expected Discharge Plan and Services Expected Discharge Plan: [place]                                               Social Determinants of Health (SDOH) Interventions    Readmission Risk Interventions     View : No data to display.

## 2021-07-20 LAB — CBC
HCT: 27.7 % — ABNORMAL LOW (ref 36.0–46.0)
Hemoglobin: 8.9 g/dL — ABNORMAL LOW (ref 12.0–15.0)
MCH: 29.2 pg (ref 26.0–34.0)
MCHC: 32.1 g/dL (ref 30.0–36.0)
MCV: 90.8 fL (ref 80.0–100.0)
Platelets: 158 10*3/uL (ref 150–400)
RBC: 3.05 MIL/uL — ABNORMAL LOW (ref 3.87–5.11)
RDW: 13.4 % (ref 11.5–15.5)
WBC: 6.3 10*3/uL (ref 4.0–10.5)
nRBC: 0 % (ref 0.0–0.2)

## 2021-07-20 LAB — GLUCOSE, CAPILLARY
Glucose-Capillary: 107 mg/dL — ABNORMAL HIGH (ref 70–99)
Glucose-Capillary: 102 mg/dL — ABNORMAL HIGH (ref 70–99)
Glucose-Capillary: 122 mg/dL — ABNORMAL HIGH (ref 70–99)
Glucose-Capillary: 86 mg/dL (ref 70–99)

## 2021-07-20 MED ORDER — FE FUMARATE-B12-VIT C-FA-IFC PO CAPS: 1.0000 | ORAL_CAPSULE | Freq: Two times a day (BID) | ORAL | 0 refills | Status: AC

## 2021-07-20 MED ORDER — CEPHALEXIN 500 MG PO CAPS
500.0000 mg | ORAL_CAPSULE | Freq: Four times a day (QID) | ORAL | Status: DC
  Administered 2021-07-20 – 2021-07-23 (×13): 500 mg via ORAL
  Filled 2021-07-20 (×14): qty 1

## 2021-07-20 MED ORDER — ENOXAPARIN SODIUM 40 MG/0.4ML IJ SOSY: 40.0000 mg | PREFILLED_SYRINGE | INTRAMUSCULAR | 0 refills | Status: AC

## 2021-07-20 MED ORDER — METHOCARBAMOL 500 MG PO TABS: 500.0000 mg | ORAL_TABLET | Freq: Four times a day (QID) | ORAL | 0 refills | Status: AC | PRN

## 2021-07-20 MED ORDER — OXYCODONE HCL 5 MG PO TABS: 2.5000 mg | ORAL_TABLET | Freq: Four times a day (QID) | ORAL | 0 refills | Status: AC | PRN

## 2021-07-20 MED ORDER — FE FUMARATE-B12-VIT C-FA-IFC PO CAPS
1.0000 | ORAL_CAPSULE | Freq: Two times a day (BID) | ORAL | Status: DC
  Administered 2021-07-20 – 2021-07-23 (×7): 1 via ORAL
  Filled 2021-07-20 (×7): qty 1

## 2021-07-20 MED ORDER — ACETAMINOPHEN 500 MG PO TABS: 1000.0000 mg | ORAL_TABLET | Freq: Four times a day (QID) | ORAL | 0 refills | Status: AC

## 2021-07-20 MED ORDER — CARBIDOPA-LEVODOPA 25-100 MG PO TABS
1.0000 | ORAL_TABLET | Freq: Three times a day (TID) | ORAL | Status: DC
  Administered 2021-07-20 – 2021-07-23 (×9): 1 via ORAL
  Filled 2021-07-20 (×9): qty 1

## 2021-07-20 MED ORDER — CEPHALEXIN 500 MG PO CAPS: 500.0000 mg | ORAL_CAPSULE | Freq: Four times a day (QID) | ORAL | Status: DC

## 2021-07-20 NOTE — Progress Notes (Signed)
Physical Therapy Treatment Patient Details Name: Adriana Sanders MRN: [id number] DOB: Aug 10, 1940 Today's Date: 07/20/2021   History of Present Illness Patient is an 81 year old female s/p left total knee arthroplasty    PT Comments    Patient alert, agreeable to PT, did inform this author her IV had come out, RN entered behind PT to assess. Supine to sit with modA for LLE management, extended time and reliance on bed rails. Sit <> stand several times this session, EOB, BSC, and recliner, minA with RW. The patient was able to step pivot to [facility] and to recliner, CGA as well as perform pericare with CGA. After a rest break, she was able to ambulate ~54f with CGA and close chair follow. Step to gait pattern with decreased gait velocity, but no LOB noted, pt did fatigue quickly. Returned to chair in room with all needs in reach at end of session. The patient would benefit from further skilled PT intervention to continue to progress towards goals. Recommendation remains appropriate.    Recommendations for follow up therapy are one component of a multi-disciplinary discharge planning process, led by the attending physician.  Recommendations may be updated based on patient status, additional functional criteria and insurance authorization.  Follow Up Recommendations  Skilled nursing-short term rehab (<3 hours/day)     Assistance Recommended at Discharge Frequent or constant Supervision/Assistance  Patient can return home with the following A little help with walking and/or transfers;A little help with bathing/dressing/bathroom;Help with stairs or ramp for entrance   Equipment Recommendations  Other (comment) (TBD)    Recommendations for Other Services       Precautions / Restrictions Precautions Precautions: Fall Restrictions Weight Bearing Restrictions: Yes LLE Weight Bearing: Weight bearing as tolerated     Mobility  Bed Mobility Overal bed mobility: Needs Assistance Bed Mobility: Supine  to Sit     Supine to sit: Mod assist     General bed mobility comments: modA for LLE management, pt able to use bed rails and scoot anteriorly without physical assist    Transfers Overall transfer level: Needs assistance Equipment used: Rolling walker (2 wheels) Transfers: Sit to/from Stand Sit to Stand: Min assist           General transfer comment: modA [facility]OB, [facility]   Ambulation/Gait Ambulation/Gait assistance: Min guard Gait Distance (Feet): 15 Feet Assistive device: Rolling walker (2 wheels) Gait Pattern/deviations: Step-to pattern, Antalgic Gait velocity: decreased     General Gait Details: gait limited today due to arrival of breakfast and pt need to void   Stairs             Wheelchair Mobility    Modified Rankin (Stroke Patients Only)       Balance Overall balance assessment: Needs assistance Sitting-balance support: Bilateral upper extremity supported, Feet supported Sitting balance-Leahy Scale: Fair     Standing balance support: During functional activity, Reliant on assistive device for balance, Bilateral upper extremity supported Standing balance-Leahy Scale: Fair Standing balance comment: able to minimally stand for pericare, CGA-minA for steadying                            Cognition Arousal/Alertness: Awake/alert Behavior During Therapy: WFL for tasks assessed/performed Overall Cognitive Status: Within Functional Limits for tasks assessed  Exercises Total Joint Exercises Ankle Circles/Pumps: AROM, Strengthening, Both, 10 reps, Seated Heel Slides: AAROM, Strengthening, Left, 10 reps, Seated Long Arc Quad: AAROM, Strengthening, Left, 10 reps, Seated    General Comments        Pertinent Vitals/Pain Pain Assessment Pain Assessment: 0-10 Pain Score: 7  Pain Location: L knee Pain Descriptors / Indicators: Discomfort, Aching, Sore Pain  Intervention(s): Limited activity within patient's tolerance, Monitored during session, Repositioned, Ice applied, Patient requesting pain meds-RN notified    Home Living                          Prior Function            PT Goals (current goals can now be found in the care plan section) Progress towards PT goals: Progressing toward goals    Frequency    BID      PT Plan Current plan remains appropriate    Co-evaluation              AM-PAC PT "6 Clicks" Mobility   Outcome Measure  Help needed turning from your back to your side while in a flat bed without using bedrails?: A Little Help needed moving from lying on your back to sitting on the side of a flat bed without using bedrails?: A Lot Help needed moving to and from a bed to a chair (including a wheelchair)?: A Little Help needed standing up from a chair using your arms (e.g., wheelchair or bedside chair)?: A Little Help needed to walk in hospital room?: A Little Help needed climbing 3-5 steps with a railing? : Total 6 Click Score: 15    End of Session Equipment Utilized During Treatment: Gait belt Activity Tolerance: Patient tolerated treatment well Patient left: with call bell/phone within reach;in chair;with chair alarm set Nurse Communication: Mobility status PT Visit Diagnosis: Unsteadiness on feet (R26.81);Muscle weakness (generalized) (M62.81);Pain Pain - Right/Left: Left Pain - part of body: Knee     Time: 3295-1884 PT Time Calculation (min) (ACUTE ONLY): 27 min  Charges:  $Therapeutic Exercise: 8-22 mins $Therapeutic Activity: 8-22 mins                     Lieutenant Diego PT, DPT 2:04 PM,07/20/21

## 2021-07-20 NOTE — Progress Notes (Addendum)
   Subjective: 2 Days Post-Op Procedure(s) (LRB): TOTAL KNEE ARTHROPLASTY (Left) Patient reports pain as mild Altered mental status seems to have resolved.  Patient denies feeling confused and seems to be very alert this morning answering all questions appropriately, alert and oriented x3. Denies any CP, SOB, ABD pain. We will continue therapy today.  Patient states she tolerated PT well this morning with no complaints.  Unable to view PT notes at this time. Plan is to go Skilled nursing facility after hospital stay.  Objective: Vital signs in last 24 hours: Temp:  [97.4 F (36.3 C)-98.8 F (37.1 C)] 98.4 F (36.9 C) (05/20 0735) Pulse Rate:  [62-68] 66 (05/20 0735) Resp:  [16-17] 16 (05/20 0735) BP: (110-129)/(52-62) 122/58 (05/20 0735) SpO2:  [95 %-100 %] 98 % (05/20 0735)  Intake/Output from previous day: 05/19 0701 - 05/20 0700 In: 200 [P.O.:200] Out: 300 [Urine:300] Intake/Output this shift: No intake/output data recorded.  Recent Labs    07/18/21 1206 07/19/21 0611 07/20/21 0536  HGB 10.7* 10.0* 8.9*   Recent Labs    07/19/21 0611 07/20/21 0536  WBC 6.6 6.3  RBC 3.41* 3.05*  HCT 32.1* 27.7*  PLT 171 158   Recent Labs    07/18/21 1206 07/19/21 0611  NA 135 134*  K 4.2 3.5  CL 106 104  CO2 21* 22  BUN 21 15  CREATININE 1.21* 0.91  GLUCOSE 114* 171*  CALCIUM 8.3* 8.4*   No results for input(s): LABPT, INR in the last 72 hours.  EXAM General - Patient is Alert and Confused Extremity - Neurovascular intact Sensation intact distally Intact pulses distally Dorsiflexion/Plantar flexion intact Dressing - dressing C/D/I and no drainage, Prevena intact with no drainage Motor Function - intact, moving foot and toes well on exam.   Past Medical History:  Diagnosis Date   Anxiety    Arthritis    both knees   Cancer ([place])    basal cell of skin, thyroid cancer.  had radiation   Depression    Diabetes mellitus without complication ([place])    Dysrhythmia     skips a beat   Hyperlipemia    Hypertension    Hyperthyroidism    Neuromuscular disorder ([place])    Panic disorder    Parkinsonism ([place])    Stroke ([place]) 2001    Assessment/Plan:   2 Days Post-Op Procedure(s) (LRB): TOTAL KNEE ARTHROPLASTY (Left) Principal Problem:   S/P TKR (total knee replacement) using cement, left  Estimated body mass index is 27.47 kg/m as calculated from the following:   Height as of this encounter: '5\' 4"'$  (1.626 m).   Weight as of this encounter: 72.6 kg. Advance diet Up with therapy Pain well controlled.  Patient slept well last night.  Acute post op blood loss anemia - Hgb 8.9. Start Iron supplement and recheck Hgb in the am  Altered mental status -UA with positive WBCs and leukocytes. Culture pending. Will start 5-day course of cephalexin.  Patient doing well with Tylenol and very low-dose narcotics.  Pain seems to be well controlled and mental status much improved, back to baseline.  Vital signs are stable  Care management to assist with discharge to skilled nursing facility  DVT Prophylaxis - Lovenox, TED hose, and SCDs Weight-Bearing as tolerated to left leg   T. Rachelle Hora, PA-C [place] 07/20/2021, 9:23 AM

## 2021-07-20 NOTE — TOC Progression Note (Signed)
Transition of Care [facility]) - Progression Note    Patient Details  Name: Adriana Sanders MRN: [id number] Date of Birth: 09-13-1940  Transition of Care [facility]) CM/SW Contact  Reylynn Vanalstine [place], [place] Phone Number: 07/20/2021, 4:25 PM  Clinical Narrative:    Phone call to patient to discuss bed offers. Patient requested that this social worker speak to her daughter [name]. Current bed offers provided and declined. Per daughter, she would like [facility]. [facility] pending at this time. Phone call to Magda Paganini to discuss potential bed offer, left voice mail requesting a return call.  [address] LCSW Transition of Care [phone]    Expected Discharge Plan: [place] Barriers to Discharge: Continued Medical Work up, SNF Pending bed offer  Expected Discharge Plan and Services Expected Discharge Plan: [place]                                               Social Determinants of Health (SDOH) Interventions    Readmission Risk Interventions     View : No data to display.

## 2021-07-20 NOTE — Discharge Summary (Signed)
Physician Discharge Summary  Patient ID: Adriana Sanders MRN: [id number] DOB/AGE: Oct 16, 1940 81 y.o.  Admit date: 07/18/2021 Discharge date: 07/23/2021  Admission Diagnoses:  S/P TKR (total knee replacement) using cement, left [Z96.652]   Discharge Diagnoses: Patient Active Problem List   Diagnosis Date Noted   S/P TKR (total knee replacement) using cement, left 07/18/2021   S/P TKR (total knee replacement) using cement, right 05/22/2020   Leg pain 01/14/2019   PAD (peripheral artery disease) ([place]) 01/14/2019   Anxiety 01/10/2019   Depression 01/10/2019   Diabetes mellitus type 2, uncomplicated ([place]) 16/96/7893   Hyperlipidemia 01/10/2019   Hypertension 01/10/2019   Panic disorder 01/10/2019   Stroke ([place]) 01/10/2019   Parkinson disease ([place]) 01/10/2019   S/P hip hemiarthroplasty 06/13/2017   Age-related osteoporosis with current pathological fracture with routine healing 04/27/2017   Closed left hip fracture ([place]) 04/22/2017    Past Medical History:  Diagnosis Date   Anxiety    Arthritis    both knees   Cancer ([place])    basal cell of skin, thyroid cancer.  had radiation   Depression    Diabetes mellitus without complication ([place])    Dysrhythmia    skips a beat   Hyperlipemia    Hypertension    Hyperthyroidism    Neuromuscular disorder ([place])    Panic disorder    Parkinsonism ([place])    Stroke ([place]) 2001     Transfusion: none   Consultants (if any):   Discharged Condition: Improved  Hospital Course: Adriana Sanders is an 81 y.o. female who was admitted 07/18/2021 with a diagnosis of S/P TKR (total knee replacement) using cement, left and went to the operating room on 07/18/2021 and underwent the above named procedures.    Surgeries: Procedure(s): TOTAL KNEE ARTHROPLASTY on 07/18/2021 Patient tolerated the surgery well. Taken to PACU where she was stabilized and then transferred to the orthopedic floor.  Started on Lovenox 30 mg q 12 hrs. Foot pumps applied  bilaterally at 80 mm. Heels elevated on bed with rolled towels. No evidence of DVT. Negative [place]. Physical therapy started on day #1 for gait training and transfer. OT started day #1 for ADL and assisted devices.  On postop day 1, patient with altered mental status.  Narcotics were held and urinalysis obtained showing mild UTI positive with leukocytes and WBCs.  Patient started on cephalexin.  Narcotics limited pain controlled with Tylenol.  On postop day 2, mental status back to baseline.  Patient's foley was d/c on day #1. Patient's IV  was d/c on day #2.  Patient was doing well postop day 3 through postop day 5, patient stable and ready for discharge, insurance pending authorization and patient was able to discharge on postop day 5  On post op day # 5 patient was stable and ready for discharge to SNF.    She was given perioperative antibiotics:  Anti-infectives (From admission, onward)    Start     Dose/Rate Route Frequency Ordered Stop   07/20/21 1200  cephALEXin (KEFLEX) capsule 500 mg        500 mg Oral Every 6 hours 07/20/21 0923 07/25/21 1159   07/20/21 0000  cephALEXin (KEFLEX) 500 MG capsule        500 mg Oral Every 6 hours 07/20/21 0928     07/18/21 1330  ceFAZolin (ANCEF) IVPB 1 g/50 mL premix        1 g 100 mL/hr over 30 Minutes Intravenous Every 6 hours 07/18/21 1354 07/19/21 0129  07/18/21 0641  ceFAZolin (ANCEF) 2-4 GM/100ML-% IVPB       Note to Pharmacy: Jordan Hawks H: cabinet override      07/18/21 0641 07/18/21 0925   07/18/21 0600  ceFAZolin (ANCEF) IVPB 2g/100 mL premix        2 g 200 mL/hr over 30 Minutes Intravenous On call to O.R. 07/18/21 0111 07/18/21 0735     .  She was given sequential compression devices, early ambulation, and Lovenox TEDs for DVT prophylaxis.  She benefited maximally from the hospital stay and there were no complications.    Recent vital signs:  Vitals:   07/23/21 0259 07/23/21 0809  BP: 114/64 127/68  Pulse: (!) 57 72   Resp: 17 18  Temp: 97.6 F (36.4 C) 98 F (36.7 C)  SpO2: 98% 99%    Recent laboratory studies:  Lab Results  Component Value Date   HGB 8.5 (L) 07/22/2021   HGB 8.5 (L) 07/21/2021   HGB 8.9 (L) 07/20/2021   Lab Results  Component Value Date   WBC 4.4 07/22/2021   PLT 171 07/22/2021   Lab Results  Component Value Date   INR 1.0 07/08/2021   Lab Results  Component Value Date   NA 134 (L) 07/19/2021   K 3.5 07/19/2021   CL 104 07/19/2021   CO2 22 07/19/2021   BUN 15 07/19/2021   CREATININE 0.91 07/19/2021   GLUCOSE 171 (H) 07/19/2021    Discharge Medications:   Allergies as of 07/23/2021       Reactions   Vicodin [hydrocodone-acetaminophen] Anaphylaxis   Unsure of any immediate reaction, but 24 hours had a stroke.  Medical MD thought this may have been related.        Medication List     STOP taking these medications    oxyCODONE-acetaminophen 5-325 MG tablet Commonly known as: PERCOCET/ROXICET       TAKE these medications    acetaminophen 500 MG tablet Commonly known as: TYLENOL Take 2 tablets (1,000 mg total) by mouth every 6 (six) hours. What changed:  how much to take when to take this reasons to take this   ALPRAZolam 1 MG tablet Commonly known as: XANAX Take 1 mg by mouth daily as needed for anxiety.   aspirin 81 MG chewable tablet Chew 1 tablet (81 mg total) by mouth 2 (two) times daily.   atorvastatin 10 MG tablet Commonly known as: LIPITOR Take 10 mg by mouth daily.   bisacodyl 5 MG EC tablet Commonly known as: DULCOLAX Take 1 tablet (5 mg total) by mouth daily as needed for moderate constipation.   carbidopa-levodopa 25-100 MG tablet Commonly known as: SINEMET IR Take 1 tablet by mouth 3 (three) times daily.   cephALEXin 500 MG capsule Commonly known as: KEFLEX Take 1 capsule (500 mg total) by mouth every 6 (six) hours.   Cholecalciferol 100 MCG (4000 UT) Caps Take 4,000 Units by mouth daily.   diclofenac Sodium 1 %  Gel Commonly known as: VOLTAREN Apply 4 g topically daily as needed for pain.   dipyridamole-aspirin 200-25 MG 12hr capsule Commonly known as: AGGRENOX Take 1 capsule by mouth 2 (two) times daily.   enoxaparin 40 MG/0.4ML injection Commonly known as: LOVENOX Inject 0.4 mLs (40 mg total) into the skin daily for 14 days.   ferrous [id number] C-folic acid capsule Commonly known as: TRINSICON / FOLTRIN Take 1 capsule by mouth 2 (two) times daily.   fexofenadine 180 MG tablet Commonly known as: ALLEGRA Take  180 mg by mouth daily.   FLUoxetine 40 MG capsule Commonly known as: PROZAC Take 40 mg by mouth daily.   FLUoxetine 20 MG capsule Commonly known as: PROZAC Take 60 mg by mouth daily.   fluticasone 50 MCG/ACT nasal spray Commonly known as: FLONASE Place 2 sprays into both nostrils daily.   lisinopril-hydrochlorothiazide 20-25 MG tablet Commonly known as: ZESTORETIC Take 1 tablet by mouth daily.   metFORMIN 500 MG tablet Commonly known as: GLUCOPHAGE Take 500 mg by mouth 2 (two) times daily with a meal.   methimazole 10 MG tablet Commonly known as: TAPAZOLE Take 10 mg by mouth daily.   methocarbamol 500 MG tablet Commonly known as: ROBAXIN Take 1 tablet (500 mg total) by mouth every 6 (six) hours as needed for muscle spasms.   metoprolol tartrate 100 MG tablet Commonly known as: LOPRESSOR Take 100 mg by mouth 2 (two) times daily with a meal.   oxyCODONE 5 MG immediate release tablet Commonly known as: Oxy IR/ROXICODONE Take 0.5-1 tablets (2.5-5 mg total) by mouth every 6 (six) hours as needed for moderate pain (pain score 4-6).   pioglitazone 15 MG tablet Commonly known as: ACTOS Take 15 mg by mouth daily.   SYSTANE OP Place 1 drop into both eyes daily.   traZODone 100 MG tablet Commonly known as: DESYREL Take 100 mg by mouth at bedtime.   vitamin B-12 500 MCG tablet Commonly known as: CYANOCOBALAMIN Take 500 mcg by mouth daily.                Durable Medical Equipment  (From admission, onward)           Start     Ordered   07/18/21 1355  DME Walker rolling  Once       Question Answer Comment  Walker: With [place]   Patient needs a walker to treat with the following condition S/P TKR (total knee replacement) using cement, left      07/18/21 1354   07/18/21 1355  DME 3 n 1  Once        07/18/21 1354   07/18/21 1355  DME Bedside commode  Once       Question:  Patient needs a bedside commode to treat with the following condition  Answer:  S/P TKR (total knee replacement) using cement, left   07/18/21 1354            Diagnostic Studies: DG Knee 1-2 Views Left  Result Date: 07/18/2021 CLINICAL DATA:  Post knee replacement EXAM: LEFT KNEE - 1-2 VIEW COMPARISON:  None Available. FINDINGS: Postoperative changes of left total knee arthroplasty. Postoperative soft tissue swelling and air noted. IMPRESSION: Standard postoperative appearance of left total knee arthroplasty. Electronically Signed   By: Macy Mis M.D.   On: 07/18/2021 10:35    Disposition:      Contact information for follow-up providers     Duanne Guess, PA-C Follow up in 2 week(s).   Specialties: Orthopedic Surgery, Emergency Medicine Contact information: [place] [address] [phone]              Contact information for after-discharge care     Destination     [facility] Preferred SNF .   Service: Skilled Nursing Contact information: [place] [phone]                      Signed: Dorise Hiss  CHRISTOPHER 07/23/2021, 9:44 AM

## 2021-07-20 NOTE — Progress Notes (Signed)
Physical Therapy Treatment Patient Details Name: Adriana Sanders MRN: [id number] DOB: 06/08/1940 Today's Date: 07/20/2021   History of Present Illness Patient is an 81 year old female s/p left total knee arthroplasty    PT Comments    Pt easily wakes to touch, agreeable to PT. Reported 5/10 L knee pain. Bed mobility performed with modA for LLE management, pt reliant on bed rails but able to scoot anteriorly without physical assist. Sit <> stand with modA, able to step pivot to [facility] with CGA and RW. Pericare in standing with CGA-minA for steadying, and pt able to take several steps towards recliner in room with CGA as well. Very slow, careful steps, no LOB noted. True ambulation deferred due to breakfast, pt set up with all needs in reach. The patient would benefit from further skilled PT intervention to continue to progress towards goals. Recommendation remains appropriate.     Recommendations for follow up therapy are one component of a multi-disciplinary discharge planning process, led by the attending physician.  Recommendations may be updated based on patient status, additional functional criteria and insurance authorization.  Follow Up Recommendations  Skilled nursing-short term rehab (<3 hours/day)     Assistance Recommended at Discharge Frequent or constant Supervision/Assistance  Patient can return home with the following A little help with walking and/or transfers;A little help with bathing/dressing/bathroom;Help with stairs or ramp for entrance   Equipment Recommendations  Other (comment) (TBD)    Recommendations for Other Services       Precautions / Restrictions Precautions Precautions: Fall Restrictions Weight Bearing Restrictions: Yes LLE Weight Bearing: Weight bearing as tolerated     Mobility  Bed Mobility Overal bed mobility: Needs Assistance Bed Mobility: Supine to Sit     Supine to sit: Mod assist     General bed mobility comments: modA for LLE management,  pt able to use bed rails and scoot anteriorly without physical assist    Transfers Overall transfer level: Needs assistance Equipment used: Rolling walker (2 wheels) Transfers: Sit to/from Stand Sit to Stand: Mod assist, Min assist           General transfer comment: modA from EOB, minA from [facility]    Ambulation/Gait Ambulation/Gait assistance: Min guard Gait Distance (Feet): 3 Feet Assistive device: Rolling walker (2 wheels)         General Gait Details: gait limited today due to arrival of breakfast and pt need to void   Stairs             Wheelchair Mobility    Modified Rankin (Stroke Patients Only)       Balance Overall balance assessment: Needs assistance Sitting-balance support: Bilateral upper extremity supported, Feet supported Sitting balance-Leahy Scale: Fair     Standing balance support: During functional activity, Reliant on assistive device for balance, Bilateral upper extremity supported Standing balance-Leahy Scale: Fair Standing balance comment: able to minimally stand for pericare, CGA-minA for steadying                            Cognition Arousal/Alertness: Awake/alert Behavior During Therapy: WFL for tasks assessed/performed Overall Cognitive Status: Within Functional Limits for tasks assessed                                          Exercises Total Joint Exercises Ankle Circles/Pumps: AROM, Strengthening, Both, 10 reps, Seated  Heel Slides: AAROM, Strengthening, Left, 10 reps, Seated Long Arc Quad: AAROM, Strengthening, Left, 10 reps, Seated    General Comments        Pertinent Vitals/Pain Pain Assessment Pain Assessment: 0-10 Pain Score: 5  Pain Location: L knee Pain Descriptors / Indicators: Discomfort, Aching, Sore Pain Intervention(s): Limited activity within patient's tolerance, Monitored during session, Repositioned, Ice applied    Home Living                          Prior  Function            PT Goals (current goals can now be found in the care plan section)      Frequency    BID      PT Plan Current plan remains appropriate    Co-evaluation              AM-PAC PT "6 Clicks" Mobility   Outcome Measure  Help needed turning from your back to your side while in a flat bed without using bedrails?: A Lot Help needed moving from lying on your back to sitting on the side of a flat bed without using bedrails?: A Lot Help needed moving to and from a bed to a chair (including a wheelchair)?: A Little Help needed standing up from a chair using your arms (e.g., wheelchair or bedside chair)?: A Little Help needed to walk in hospital room?: A Little Help needed climbing 3-5 steps with a railing? : Total 6 Click Score: 14    End of Session Equipment Utilized During Treatment: Gait belt Activity Tolerance: Patient tolerated treatment well Patient left: with call bell/phone within reach;in chair;with chair alarm set Nurse Communication: Mobility status PT Visit Diagnosis: Unsteadiness on feet (R26.81);Muscle weakness (generalized) (M62.81);Pain Pain - Right/Left: Left Pain - part of body: Knee     Time: 4650-3546 PT Time Calculation (min) (ACUTE ONLY): 25 min  Charges:  $Therapeutic Exercise: 8-22 mins $Therapeutic Activity: 8-22 mins                     Lieutenant Diego PT, DPT 9:26 AM,07/20/21

## 2021-07-20 NOTE — Discharge Instructions (Signed)

## 2021-07-21 LAB — GLUCOSE, CAPILLARY
Glucose-Capillary: 92 mg/dL (ref 70–99)
Glucose-Capillary: 125 mg/dL — ABNORMAL HIGH (ref 70–99)
Glucose-Capillary: 111 mg/dL — ABNORMAL HIGH (ref 70–99)
Glucose-Capillary: 284 mg/dL — ABNORMAL HIGH (ref 70–99)
Glucose-Capillary: 201 mg/dL — ABNORMAL HIGH (ref 70–99)

## 2021-07-21 LAB — CBC
HCT: 26.6 % — ABNORMAL LOW (ref 36.0–46.0)
Hemoglobin: 8.5 g/dL — ABNORMAL LOW (ref 12.0–15.0)
MCH: 29 pg (ref 26.0–34.0)
MCHC: 32 g/dL (ref 30.0–36.0)
MCV: 90.8 fL (ref 80.0–100.0)
Platelets: 150 10*3/uL (ref 150–400)
RBC: 2.93 MIL/uL — ABNORMAL LOW (ref 3.87–5.11)
RDW: 13.6 % (ref 11.5–15.5)
WBC: 5.6 10*3/uL (ref 4.0–10.5)
nRBC: 0 % (ref 0.0–0.2)

## 2021-07-21 LAB — URINE CULTURE: Culture: <10000 — AB

## 2021-07-21 MED ORDER — OXYCODONE HCL 5 MG PO TABS
2.5000 mg | ORAL_TABLET | Freq: Four times a day (QID) | ORAL | Status: DC | PRN
  Administered 2021-07-21 – 2021-07-23 (×5): 2.5 mg via ORAL
  Filled 2021-07-21 (×5): qty 1

## 2021-07-21 MED ORDER — SENNA 8.6 MG PO TABS
1.0000 | ORAL_TABLET | Freq: Two times a day (BID) | ORAL | Status: DC
Start: 2021-07-21 — End: 2021-07-23
  Administered 2021-07-21 – 2021-07-22 (×2): 8.6 mg via ORAL
  Filled 2021-07-21 (×4): qty 1

## 2021-07-21 NOTE — Progress Notes (Signed)
   Subjective: 3 Days Post-Op Procedure(s) (LRB): TOTAL KNEE ARTHROPLASTY (Left) Patient sleeping this am. Easily awakened. Patient denies any complaints. Pain is mild. Denies any CP, SOB, ABD pain. We will continue therapy today. Plan is to go Skilled nursing facility after hospital stay.  Objective: Vital signs in last 24 hours: Temp:  [97.5 F (36.4 C)-98.4 F (36.9 C)] 98.4 F (36.9 C) (05/21 0352) Pulse Rate:  [58-72] 72 (05/21 0847) Resp:  [16-17] 16 (05/21 0847) BP: (113-141)/(53-70) 141/70 (05/21 0847) SpO2:  [97 %-99 %] 98 % (05/21 0847)  Intake/Output from previous day: 05/20 0701 - 05/21 0700 In: 240 [P.O.:240] Out: 375 [Urine:375] Intake/Output this shift: No intake/output data recorded.  Recent Labs    07/18/21 1206 07/19/21 0611 07/20/21 0536 07/21/21 0420  HGB 10.7* 10.0* 8.9* 8.5*   Recent Labs    07/20/21 0536 07/21/21 0420  WBC 6.3 5.6  RBC 3.05* 2.93*  HCT 27.7* 26.6*  PLT 158 150   Recent Labs    07/18/21 1206 07/19/21 0611  NA 135 134*  K 4.2 3.5  CL 106 104  CO2 21* 22  BUN 21 15  CREATININE 1.21* 0.91  GLUCOSE 114* 171*  CALCIUM 8.3* 8.4*   No results for input(s): LABPT, INR in the last 72 hours.  EXAM General - Patient is Alert and Appropriate. Lethargic. Extremity - Neurovascular intact Sensation intact distally Intact pulses distally Dorsiflexion/Plantar flexion intact Dressing - dressing C/D/I and no drainage, Prevena intact with no drainage Motor Function - intact, moving foot and toes well on exam.   Past Medical History:  Diagnosis Date   Anxiety    Arthritis    both knees   Cancer ([place])    basal cell of skin, thyroid cancer.  had radiation   Depression    Diabetes mellitus without complication ([place])    Dysrhythmia    skips a beat   Hyperlipemia    Hypertension    Hyperthyroidism    Neuromuscular disorder ([place])    Panic disorder    Parkinsonism ([place])    Stroke ([place]) 2001    Assessment/Plan:   3  Days Post-Op Procedure(s) (LRB): TOTAL KNEE ARTHROPLASTY (Left) Principal Problem:   S/P TKR (total knee replacement) using cement, left  Estimated body mass index is 27.47 kg/m as calculated from the following:   Height as of this encounter: '5\' 4"'$  (1.626 m).   Weight as of this encounter: 72.6 kg. Advance diet Up with therapy Pain well controlled.   Acute post op blood loss anemia - Hgb 8.5. Continue with Iron supplement and recheck Hgb in the am  Altered mental status - Improving. Thought to be medication induced. Patient alert and oriented but lethargic this am. Will cut oxycodone dose down to 2.5 mg and continue to monitor mental status. Avoid narcotics unless severe pain, Tylenol scheduled  UTI - 5 day course of Cephalexin. Culture pending.  Vital signs are stable  Work on [place] management to assist with discharge to skilled nursing facility  DVT Prophylaxis - Lovenox, TED hose, and SCDs Weight-Bearing as tolerated to left leg   T. Rachelle Hora, PA-C [place] 07/21/2021, 10:25 AM

## 2021-07-21 NOTE — Progress Notes (Signed)
Physical Therapy Treatment Patient Details Name: Adriana Sanders MRN: [id number] DOB: 09/10/1940 Today's Date: 07/21/2021   History of Present Illness Patient is an 81 year old female s/p left total knee arthroplasty    PT Comments    Pt alert, immediately asking to void on PT entrance to room. She reported 7-8/10 L knee pain, and RN in room at end of session to administer pain medication. The patient demonstrated improved bed mobility today, able to move LLE without assist, still reliant on bed rails and HOB elevated, CGA. Sit <> stand several times during session, min-modA. The patient transferred to [facility] with RW and CGA, able to void and perform pericare in standing with CGA-minA for steadying. After a seated rest break, she ambulated ~41f but was unable to go any further due to elevated pain and fatigue. Pt in room with RN at bedside at end of session. The patient would benefit from further skilled PT intervention to continue to progress towards goals. Recommendation remains appropriate.    Recommendations for follow up therapy are one component of a multi-disciplinary discharge planning process, led by the attending physician.  Recommendations may be updated based on patient status, additional functional criteria and insurance authorization.  Follow Up Recommendations  Skilled nursing-short term rehab (<3 hours/day)     Assistance Recommended at Discharge Frequent or constant Supervision/Assistance  Patient can return home with the following A little help with walking and/or transfers;A little help with bathing/dressing/bathroom;Help with stairs or ramp for entrance   Equipment Recommendations  Other (comment) (TBD)    Recommendations for Other Services       Precautions / Restrictions Precautions Precautions: Fall Restrictions Weight Bearing Restrictions: Yes LLE Weight Bearing: Weight bearing as tolerated     Mobility  Bed Mobility Overal bed mobility: Needs Assistance Bed  Mobility: Supine to Sit     Supine to sit: Min guard, HOB elevated     General bed mobility comments: pt with improved ability to move LLE with assistance today, still very reliant on bed rails and extra time    Transfers Overall transfer level: Needs assistance Equipment used: Rolling walker (2 wheels) Transfers: Sit to/from Stand Sit to Stand: Mod assist, Min assist           General transfer comment: modA [facility] ([facility] support)    Ambulation/Gait Ambulation/Gait assistance: Min guard Gait Distance (Feet): 22 Feet Assistive device: Rolling walker (2 wheels) Gait Pattern/deviations: Step-to pattern, Antalgic Gait velocity: decreased         Stairs             Wheelchair Mobility    Modified Rankin (Stroke Patients Only)       Balance Overall balance assessment: Needs assistance Sitting-balance support: Bilateral upper extremity supported, Feet supported Sitting balance-Leahy Scale: Fair     Standing balance support: During functional activity, Reliant on assistive device for balance, Bilateral upper extremity supported Standing balance-Leahy Scale: Fair Standing balance comment: able to minimally stand for pericare, CGA-minA for steadying                            Cognition Arousal/Alertness: Awake/alert Behavior During Therapy: WFL for tasks assessed/performed Overall Cognitive Status: Within Functional Limits for tasks assessed  Exercises      General Comments        Pertinent Vitals/Pain Pain Assessment Pain Assessment: 0-10 Pain Score: 8  Pain Location: L knee Pain Descriptors / Indicators: Discomfort, Aching, Sore Pain Intervention(s): Limited activity within patient's tolerance, Monitored during session, Repositioned, Patient requesting pain meds-RN notified, Ice applied    Home Living                          Prior  Function            PT Goals (current goals can now be found in the care plan section) Progress towards PT goals: Progressing toward goals    Frequency    BID      PT Plan Current plan remains appropriate    Co-evaluation              AM-PAC PT "6 Clicks" Mobility   Outcome Measure  Help needed turning from your back to your side while in a flat bed without using bedrails?: A Little Help needed moving from lying on your back to sitting on the side of a flat bed without using bedrails?: A Lot Help needed moving to and from a bed to a chair (including a wheelchair)?: A Little Help needed standing up from a chair using your arms (e.g., wheelchair or bedside chair)?: A Little Help needed to walk in hospital room?: A Little Help needed climbing 3-5 steps with a railing? : Total 6 Click Score: 15    End of Session Equipment Utilized During Treatment: Gait belt Activity Tolerance: Patient tolerated treatment well;Patient limited by pain Patient left: with call bell/phone within reach;in chair;with chair alarm set Nurse Communication: Mobility status PT Visit Diagnosis: Unsteadiness on feet (R26.81);Muscle weakness (generalized) (M62.81);Pain Pain - Right/Left: Left Pain - part of body: Knee     Time: 0820-0849 PT Time Calculation (min) (ACUTE ONLY): 29 min  Charges:  $Therapeutic Activity: 23-37 mins                     Lieutenant Diego PT, DPT 10:41 AM,07/21/21

## 2021-07-22 LAB — CBC
HCT: 25.8 % — ABNORMAL LOW (ref 36.0–46.0)
Hemoglobin: 8.5 g/dL — ABNORMAL LOW (ref 12.0–15.0)
MCH: 29.7 pg (ref 26.0–34.0)
MCHC: 32.9 g/dL (ref 30.0–36.0)
MCV: 90.2 fL (ref 80.0–100.0)
Platelets: 171 10*3/uL (ref 150–400)
RBC: 2.86 MIL/uL — ABNORMAL LOW (ref 3.87–5.11)
RDW: 13.6 % (ref 11.5–15.5)
WBC: 4.4 10*3/uL (ref 4.0–10.5)
nRBC: 0 % (ref 0.0–0.2)

## 2021-07-22 LAB — GLUCOSE, CAPILLARY
Glucose-Capillary: 185 mg/dL — ABNORMAL HIGH (ref 70–99)
Glucose-Capillary: 135 mg/dL — ABNORMAL HIGH (ref 70–99)
Glucose-Capillary: 115 mg/dL — ABNORMAL HIGH (ref 70–99)
Glucose-Capillary: 142 mg/dL — ABNORMAL HIGH (ref 70–99)

## 2021-07-22 NOTE — Care Management Important Message (Signed)
Important Message  Patient Details  Name: Adriana Sanders MRN: [id number] Date of Birth: 03-24-1940   Medicare Important Message Given:  Yes     Juliann Pulse A Shogo Larkey 07/22/2021, 11:59 AM

## 2021-07-22 NOTE — Progress Notes (Signed)
Occupational Therapy Treatment Patient Details Name: Adriana Sanders MRN: [id number] DOB: Nov 23, 1940 Today's Date: 07/22/2021   History of present illness Patient is an 81 year old female s/p left total knee arthroplasty   OT comments  Upon entering the room, pt supine in bed with daughter present in room. Pt reporting urgency for toileting. Pt performs bed mobility with min A for trunk support and increased time. Pt stands with RW and min guard. Min guard for short distance ambulation ~ 6' to [facility] for urgent toileting needs. Pt does report 7/10 pain and RN giving medication prior to therapist arrival. Pt stands with min A for balance while pt is able to perform her own hygiene. Pt ambulating back to recliner chair at end of session with plans for RN to change dressing. All needs within reach and pt reports comfort in recliner chair. Pt continues to benefit from OT intervention with recommendation for short term rehab to address functional deficits before returning home.    Recommendations for follow up therapy are one component of a multi-disciplinary discharge planning process, led by the attending physician.  Recommendations may be updated based on patient status, additional functional criteria and insurance authorization.    Follow Up Recommendations  Skilled nursing-short term rehab (<3 hours/day)       Patient can return home with the following  A lot of help with walking and/or transfers;A lot of help with bathing/dressing/bathroom;Assistance with cooking/housework;Assist for transportation;Help with stairs or ramp for entrance   Equipment Recommendations  Other (comment) (defer to next venue of care)    Recommendations for Other Services      Precautions / Restrictions Precautions Precautions: Fall Restrictions Weight Bearing Restrictions: Yes LLE Weight Bearing: Weight bearing as tolerated Other Position/Activity Restrictions: wound vac       Mobility Bed Mobility Overal bed  mobility: Needs Assistance Bed Mobility: Supine to Sit Rolling: Min assist         General bed mobility comments: assistance for trunk support to EOB    Transfers Overall transfer level: Needs assistance Equipment used: Rolling walker (2 wheels) Transfers: Sit to/from Stand Sit to Stand: Min guard                 Balance Overall balance assessment: Needs assistance Sitting-balance support: Bilateral upper extremity supported, Feet supported Sitting balance-Leahy Scale: Fair     Standing balance support: During functional activity, Reliant on assistive device for balance, Bilateral upper extremity supported Standing balance-Leahy Scale: Fair                             ADL either performed or assessed with clinical judgement   ADL Overall ADL's : Needs assistance/impaired Eating/Feeding: Modified independent   Grooming: Wash/dry hands;Standing;Min guard                   Toilet Transfer: Min guard;Rolling walker (2 wheels);BSC/3in1   Toileting- Clothing Manipulation and Hygiene: Minimal assistance;Sit to/from stand              Extremity/Trunk Assessment Upper Extremity Assessment Upper Extremity Assessment: Generalized weakness   Lower Extremity Assessment Lower Extremity Assessment: Defer to PT evaluation        Vision Patient Visual Report: No change from baseline            Cognition Arousal/Alertness: Awake/alert Behavior During Therapy: WFL for tasks assessed/performed Overall Cognitive Status: Within Functional Limits for tasks assessed  General Comments: Pt is pleasant and cooperative and motivated for OT intervention.                   Pertinent Vitals/ Pain       Pain Assessment Pain Assessment: 0-10 Pain Score: 7  Pain Location: L knee Pain Descriptors / Indicators: Discomfort, Aching, Sore, Tightness Pain Intervention(s): Limited activity within patient's  tolerance, Monitored during session, Repositioned         Frequency  Min 2X/week        Progress Toward Goals  OT Goals(current goals can now be found in the care plan section)  Progress towards OT goals: Progressing toward goals  Acute Rehab OT Goals Patient Stated Goal: to go to rehab OT Goal Formulation: With patient/family Time For Goal Achievement: 08/02/21 Potential to Achieve Goals: [place] Discharge plan remains appropriate       AM-PAC OT "6 Clicks" Daily Activity     Outcome Measure   Help from another person eating meals?: None Help from another person taking care of personal grooming?: A Little Help from another person toileting, which includes using toliet, bedpan, or urinal?: A Lot Help from another person bathing (including washing, rinsing, drying)?: A Lot Help from another person to put on and taking off regular upper body clothing?: A Little Help from another person to put on and taking off regular lower body clothing?: A Lot 6 Click Score: 16    End of Session Equipment Utilized During Treatment: Rolling walker (2 wheels)  OT Visit Diagnosis: Unsteadiness on feet (R26.81);Muscle weakness (generalized) (M62.81);History of falling (Z91.81)   Activity Tolerance Patient tolerated treatment well   Patient Left with call bell/phone within reach;in chair;with family/visitor present   Nurse Communication          Time: 1442-1500 OT Time Calculation (min): 18 min  Charges: OT General Charges $OT Visit: 1 Visit OT Treatments $Self Care/Home Management : 8-22 mins  Darleen Crocker, MS, OTR/L , CBIS ascom [phone]  07/22/21, 3:37 PM

## 2021-07-22 NOTE — Progress Notes (Signed)
   Subjective: 4 Days Post-Op Procedure(s) (LRB): TOTAL KNEE ARTHROPLASTY (Left) Patient reports pain as moderate, 8/10. Last dose of oxycodone was last night. Patient is alert with normal mental status this am. Denies any CP, SOB, ABD pain. + BM We will continue therapy today Plan is to go Skilled nursing facility after hospital stay.  Objective: Vital signs in last 24 hours: Temp:  [97.5 F (36.4 C)-98.1 F (36.7 C)] 97.6 F (36.4 C) (05/22 0532) Pulse Rate:  [72-91] 88 (05/22 0532) Resp:  [16] 16 (05/22 0532) BP: (98-141)/(45-70) 104/56 (05/22 0532) SpO2:  [96 %-100 %] 96 % (05/22 0532)  Intake/Output from previous day: 05/21 0701 - 05/22 0700 In: 1303.6 [P.O.:240; I.V.:1063.6] Out: -  Intake/Output this shift: No intake/output data recorded.  Recent Labs    07/20/21 0536 07/21/21 0420  HGB 8.9* 8.5*   Recent Labs    07/20/21 0536 07/21/21 0420  WBC 6.3 5.6  RBC 3.05* 2.93*  HCT 27.7* 26.6*  PLT 158 150   No results for input(s): NA, K, CL, CO2, BUN, CREATININE, GLUCOSE, CALCIUM in the last 72 hours.  No results for input(s): LABPT, INR in the last 72 hours.  EXAM General - Patient is Alert and Oriented Extremity - Neurovascular intact Sensation intact distally Intact pulses distally Dorsiflexion/Plantar flexion intact Dressing - dressing C/D/I and no drainage, Prevena intact with no drainage Motor Function - intact, moving foot and toes well on exam.   Past Medical History:  Diagnosis Date   Anxiety    Arthritis    both knees   Cancer ([place])    basal cell of skin, thyroid cancer.  had radiation   Depression    Diabetes mellitus without complication ([place])    Dysrhythmia    skips a beat   Hyperlipemia    Hypertension    Hyperthyroidism    Neuromuscular disorder ([place])    Panic disorder    Parkinsonism ([place])    Stroke ([place]) 2001    Assessment/Plan:   4 Days Post-Op Procedure(s) (LRB): TOTAL KNEE ARTHROPLASTY (Left) Principal Problem:   S/P  TKR (total knee replacement) using cement, left  Estimated body mass index is 27.47 kg/m as calculated from the following:   Height as of this encounter: '5\' 4"'$  (1.626 m).   Weight as of this encounter: 72.6 kg. Advance diet Up with therapy  Acute post op blood loss anemia - Hgb pending this am. Continue with Iron supplement and recheck Hgb in the am  Altered mental status - resolved.   UTI - On oral abx. Cultures pending  Vital signs are stable  Care management to assist with discharge to skilled nursing facility today  DVT Prophylaxis - Lovenox, TED hose, and SCDs Weight-Bearing as tolerated to left leg   T. Rachelle Hora, PA-C [place] 07/22/2021, 8:01 AM

## 2021-07-22 NOTE — Progress Notes (Signed)
Physical Therapy Treatment Patient Details Name: Adriana Sanders MRN: [id number] DOB: 04-24-1940 Today's Date: 07/22/2021   History of Present Illness Patient is an 81 year old female s/p left total knee arthroplasty    PT Comments    Patient alert, with RN at bedside. Reported 8/10 L knee pain throughout session. She was able to stand and pivot to [facility], with CGA, extended time, one reminder for hand placement. MinA from [facility] for [place]. After a seated rest break the patient was able to ambulate ~82f with RW, CGA, and close chair follow. Very short, shuffled steps, decreased weight bearing on LLE due to pain, but pt motivated to complete ambulation. Still very fatigued and limited in ability to ambulate household distances. Pt up in chair at end of session with all needs in reach. The patient would benefit from further skilled PT intervention to continue to progress towards goals. Recommendation remains appropriate.        Recommendations for follow up therapy are one component of a multi-disciplinary discharge planning process, led by the attending physician.  Recommendations may be updated based on patient status, additional functional criteria and insurance authorization.  Follow Up Recommendations  Skilled nursing-short term rehab (<3 hours/day)     Assistance Recommended at Discharge Frequent or constant Supervision/Assistance  Patient can return home with the following A little help with walking and/or transfers;A little help with bathing/dressing/bathroom;Help with stairs or ramp for entrance   Equipment Recommendations  Other (comment) (TBD)    Recommendations for Other Services       Precautions / Restrictions Precautions Precautions: Fall Restrictions Weight Bearing Restrictions: Yes LLE Weight Bearing: Weight bearing as tolerated     Mobility  Bed Mobility               General bed mobility comments: pt up in recliner at start/end of session     Transfers Overall transfer level: Needs assistance Equipment used: Rolling walker (2 wheels) Transfers: Sit to/from Stand Sit to Stand: Min guard, Min assist           [facility] comment[facility] from [facility]iner with extra time and reliance on chair arms    Ambulation/Gait Ambulation/Gait assistance: Min guard Gait Distance (Feet): 35 Feet Assistive device: Rolling walker (2 wheels) Gait Pattern/deviations: Step-to pattern, Antalgic Gait velocity: decreased     General Gait Details: very short, shuffled steps, a few standing pt led rest breaks   Stairs             Wheelchair Mobility    Modified Rankin (Stroke Patients Only)       Balance Overall balance assessment: Needs assistance Sitting-balance support: Bilateral upper extremity supported, Feet supported Sitting balance-Leahy Scale: Fair     Standing balance support: During functional activity, Reliant on assistive device for balance, Bilateral upper extremity supported Standing balance-Leahy Scale: Fair Standing balance comment: able to minimally stand for pericare, CGA-minA for steadying                            Cognition Arousal/Alertness: Awake/alert Behavior During Therapy: WFL for tasks assessed/performed Overall Cognitive Status: Within Functional Limits for tasks assessed                                          Exercises      General Comments  Pertinent Vitals/Pain Pain Assessment Pain Assessment: 0-10 Pain Score: 8  Pain Location: L knee Pain Descriptors / Indicators: Discomfort, Aching, Sore, Tightness Pain Intervention(s): Limited activity within patient's tolerance, Monitored during session, Repositioned, Patient requesting pain meds-RN notified, Ice applied    Home Living                          Prior Function            PT Goals (current goals can now be found in the care plan section) Progress towards  PT goals: Progressing toward goals    Frequency    BID      PT Plan Current plan remains appropriate    Co-evaluation              AM-PAC PT "6 Clicks" Mobility   Outcome Measure  Help needed turning from your back to your side while in a flat bed without using bedrails?: A Little Help needed moving from lying on your back to sitting on the side of a flat bed without using bedrails?: A Little Help needed moving to and from a bed to a chair (including a wheelchair)?: A Little Help needed standing up from a chair using your arms (e.g., wheelchair or bedside chair)?: A Little Help needed to walk in hospital room?: A Little Help needed climbing 3-5 steps with a railing? : Total 6 Click Score: 16    End of Session Equipment Utilized During Treatment: Gait belt Activity Tolerance: Patient tolerated treatment well;Patient limited by pain Patient left: with call bell/phone within reach;in chair;with chair alarm set Nurse Communication: Mobility status PT Visit Diagnosis: Unsteadiness on feet (R26.81);Muscle weakness (generalized) (M62.81);Pain Pain - Right/Left: Left Pain - part of body: Knee     Time: 2683-4196 PT Time Calculation (min) (ACUTE ONLY): 25 min  Charges:  $Therapeutic Activity: 23-37 mins                     Lieutenant Diego PT, DPT 11:48 AM,07/22/21

## 2021-07-22 NOTE — Progress Notes (Signed)
1531 Skin tear noted just above wound vac draining serosanguinous fluids. Skin tear cleaned and dressed with xeroform and non adherent gauze with tegaderm

## 2021-07-22 NOTE — Progress Notes (Signed)
Physical Therapy Treatment Patient Details Name: Adriana Sanders MRN: [id number] DOB: 02-04-1941 Today's Date: 07/22/2021   History of Present Illness Patient is an 81 year old female s/p left total knee arthroplasty    PT Comments    Pt alert, in chair at start of session. L knee noted for drainage, RN aware and in room to address at end of session. The patient demonstrated good progression towards goals this session. Sit <> Stand with CGA and RW (extra time from recliner and from [facility]). She was able to ambulate ~33f with close chair follow and RW/CGA, pt needed 1-2 standing rest breaks due to fatigue and pain. Exhibited decreased step height/length bilaterally, as well as antalgic. Returned to room with all needs in reach. The patient would benefit from further skilled PT intervention to continue to progress towards goals. Recommendation remains appropriate.    Recommendations for follow up therapy are one component of a multi-disciplinary discharge planning process, led by the attending physician.  Recommendations may be updated based on patient status, additional functional criteria and insurance authorization.  Follow Up Recommendations  Skilled nursing-short term rehab (<3 hours/day)     Assistance Recommended at Discharge Frequent or constant Supervision/Assistance  Patient can return home with the following A little help with walking and/or transfers;A little help with bathing/dressing/bathroom;Help with stairs or ramp for entrance   Equipment Recommendations  Other (comment) (TBD)    Recommendations for Other Services       Precautions / Restrictions Precautions Precautions: Fall Restrictions Weight Bearing Restrictions: Yes LLE Weight Bearing: Weight bearing as tolerated     Mobility  Bed Mobility               General bed mobility comments: pt up in recliner at start/end of session    Transfers Overall transfer level: Needs assistance Equipment used: Rolling  walker (2 wheels) Transfers: Sit to/from Stand Sit to Stand: Min guard           [facility]    Ambulation/Gait Ambulation/Gait assistance: Min guard Gait Distance (Feet): 45 Feet Assistive device: Rolling walker (2 wheels) Gait Pattern/deviations: Step-to pattern, Antalgic Gait velocity: decreased     General Gait Details: very short, shuffled steps, a few standing pt led rest breaks, very effortful for pt today   Stairs             Wheelchair Mobility    Modified Rankin (Stroke Patients Only)       Balance Overall balance assessment: Needs assistance Sitting-balance support: Bilateral upper extremity supported, Feet supported Sitting balance-Leahy Scale: Fair     Standing balance support: During functional activity, Reliant on assistive device for balance, Bilateral upper extremity supported Standing balance-Leahy Scale: Fair Standing balance comment: able to minimally stand for pericare, CGA-minA for steadying                            Cognition Arousal/Alertness: Awake/alert Behavior During Therapy: WFL for tasks assessed/performed Overall Cognitive Status: Within Functional Limits for tasks assessed                                          Exercises Other Exercises Other Exercises: BSC transfer, pericare CGA    General Comments        Pertinent Vitals/Pain Pain Assessment Pain Assessment: 0-10 Pain Score: 7  Pain Location: L knee Pain Descriptors / Indicators: Discomfort, Aching, Sore, Tightness Pain Intervention(s): Limited activity within patient's tolerance, Monitored during session, Repositioned, Ice applied    Home Living                          Prior Function            PT Goals (current goals can now be found in the care plan section) Progress towards PT goals: Progressing toward goals    Frequency    BID      PT Plan Current plan  remains appropriate    Co-evaluation              AM-PAC PT "6 Clicks" Mobility   Outcome Measure  Help needed turning from your back to your side while in a flat bed without using bedrails?: A Little Help needed moving from lying on your back to sitting on the side of a flat bed without using bedrails?: A Little Help needed moving to and from a bed to a chair (including a wheelchair)?: A Little Help needed standing up from a chair using your arms (e.g., wheelchair or bedside chair)?: A Little Help needed to walk in hospital room?: A Little Help needed climbing 3-5 steps with a railing? : Total 6 Click Score: 16    End of Session Equipment Utilized During Treatment: Gait belt Activity Tolerance: Patient tolerated treatment well;Patient limited by pain Patient left: with call bell/phone within reach;in chair;with chair alarm set;with nursing/sitter in room Nurse Communication: Mobility status PT Visit Diagnosis: Unsteadiness on feet (R26.81);Muscle weakness (generalized) (M62.81);Pain Pain - Right/Left: Left Pain - part of body: Knee     Time: 6269-4854 PT Time Calculation (min) (ACUTE ONLY): 17 min  Charges:  $Therapeutic Activity: 8-22 mins                     Lieutenant Diego PT, DPT 3:29 PM,07/22/21

## 2021-07-22 NOTE — Plan of Care (Signed)

## 2021-07-22 NOTE — TOC Progression Note (Signed)
Transition of Care [facility]) - Progression Note    Patient Details  Name: Adriana Sanders MRN: [id number] Date of Birth: Apr 15, 1940  Transition of Care [facility]) CM/SW [place], RN Phone Number: 07/22/2021, 12:05 PM  Clinical Narrative:    Met with the patient and called the daughter from the room to review the bed offers, She chose [facility], I notified Tammy at [facility],    Expected Discharge Plan: [place] Barriers to Discharge: Continued Medical Work up, SNF Pending bed offer  Expected Discharge Plan and Services Expected Discharge Plan: [place]                                               Social Determinants of Health (SDOH) Interventions    Readmission Risk Interventions     View : No data to display.

## 2021-07-23 LAB — GLUCOSE, CAPILLARY: Glucose-Capillary: 129 mg/dL — ABNORMAL HIGH (ref 70–99)

## 2021-07-23 NOTE — TOC Progression Note (Signed)
Transition of Care [facility]) - Progression Note    Patient Details  Name: Adriana Sanders MRN: [id number] Date of Birth: 1940/11/11  Transition of Care [facility]) CM/SW [place], RN Phone Number: 07/23/2021, 11:18 AM  Clinical Narrative:     Patient going to room [facility] at [facility] her daughter is aware EMS called  Expected Discharge Plan: Skilled Nursing Facility Barriers to Discharge: Continued Medical Work up, SNF Pending bed offer  Expected Discharge Plan and Services Expected Discharge Plan: [place]         Expected Discharge Date: 07/23/21                                     Social Determinants of Health (SDOH) Interventions    Readmission Risk Interventions     View : No data to display.

## 2021-07-23 NOTE — Progress Notes (Addendum)
[place] PA aware of pt skin tear to left leg right above knee. Skin tear has been draining serous fluid, nursing has been dressing it with xeroform and non adherent dressing. PA agrees.  1125 Report given to Bibo at the [place]. All questions and concerns answered.   1210 Wound vac switched to prevena at this time   1521 EMS here to transport pt to [facility] at this time

## 2021-07-23 NOTE — Progress Notes (Signed)
Physical Therapy Treatment Patient Details Name: Adriana Sanders MRN: [id number] DOB: Dec 06, 1940 Today's Date: 07/23/2021   History of Present Illness Patient is an 81 year old female s/p left total knee arthroplasty    PT Comments    Physical Therapy session completed this date. Patient continues to make improvements with every session. Upon arrival patient was in bed with family, RN, and NT in room. Patient was able to complete all bed mobility at SUP with increased time/effort, HOB elevated, and use of bed rails. No physical assistance required. Transfers to/from [facility] was completed at CGA/SBA. With 1-2 standing rest breaks, patient was able to complete ~59feet during ambulation bout with RW at CGA/SBA. Patient additionally completed LAQ and multiple sit to stand reps to improve BLE strengthening and ROM. Patient was left in bed with all needs met and in reach with daughter present. Patient would continue to benefit from skilled physical therapy in  order to optimize patient's return to PLOF. Continue to recommend STE upon discharge from acute hospitalization.     Recommendations for follow up therapy are one component of a multi-disciplinary discharge planning process, led by the attending physician.  Recommendations may be updated based on patient status, additional functional criteria and insurance authorization.  Follow Up Recommendations  Skilled nursing-short term rehab (<3 hours/day)     Assistance Recommended at Discharge Frequent or constant Supervision/Assistance  Patient can return home with the following A little help with walking and/or transfers;A little help with bathing/dressing/bathroom;Help with stairs or ramp for entrance   Equipment Recommendations  Other (comment) (TBD)    Recommendations for Other Services       Precautions / Restrictions Precautions Precautions: Fall Restrictions Weight Bearing Restrictions: Yes LLE Weight Bearing: Weight bearing as  tolerated Other Position/Activity Restrictions: wound vac     Mobility  Bed Mobility Overal bed mobility: Needs Assistance Bed Mobility: Supine to Sit, Sit to Supine     Supine to sit: Supervision, HOB elevated Sit to supine: Supervision, HOB elevated   General bed mobility comments: increased use of bed rails to complete supine to sitting transfer    Transfers Overall transfer level: Needs assistance Equipment used: Rolling walker (2 wheels) Transfers: Sit to/from Stand Sit to Stand:  (SBA)           General transfer comment: SBA from EOB and BSC, x5 additional reps for strengthening    Ambulation/Gait Ambulation/Gait assistance: Min guard Gait Distance (Feet): 85 Feet Assistive device: Rolling walker (2 wheels) Gait Pattern/deviations: Step-to pattern, Antalgic Gait velocity: decreased     General Gait Details: very short, shuffled steps, no LOB noted   Stairs             Wheelchair Mobility    Modified Rankin (Stroke Patients Only)       Balance Overall balance assessment: Needs assistance Sitting-balance support: Bilateral upper extremity supported, Feet supported Sitting balance-Leahy Scale: Fair     Standing balance support: During functional activity, Reliant on assistive device for balance, Bilateral upper extremity supported Standing balance-Leahy Scale: Fair Standing balance comment: able to stand for pericare,SBA for safety                            Cognition Arousal/Alertness: Awake/alert Behavior During Therapy: [facility] for tasks assessed/performed Overall Cognitive Status: Within Functional Limits for tasks assessed  Exercises Total Joint Exercises Long Arc Quad: AAROM, Strengthening, Left, 10 reps, Seated    General Comments        Pertinent Vitals/Pain Pain Assessment Pain Assessment: 0-10 Pain Score: 5  Faces Pain Scale: Hurts little more Pain  Location: L knee Pain Descriptors / Indicators: Discomfort, Aching, Sore, Tightness Pain Intervention(s): Monitored during session, Repositioned    Home Living                          Prior Function            PT Goals (current goals can now be found in the care plan section) Acute Rehab PT Goals Patient Stated Goal: patient did not state a goal PT Goal Formulation: With patient Time For Goal Achievement: 08/01/21 Potential to Achieve Goals: Fair Additional Goals Additional Goal #1: Patient will demonstrate all bed mobility at Mod I to increase ease and independence with all bed mobility transfers Progress towards PT goals: Progressing toward goals    Frequency    BID      PT Plan Current plan remains appropriate    Co-evaluation              AM-PAC PT "6 Clicks" Mobility   Outcome Measure  Help needed turning from your back to your side while in a flat bed without using bedrails?: A Little Help needed moving from lying on your back to sitting on the side of a flat bed without using bedrails?: A Little Help needed moving to and from a bed to a chair (including a wheelchair)?: A Little Help needed standing up from a chair using your arms (e.g., wheelchair or bedside chair)?: A Little Help needed to walk in hospital room?: A Little Help needed climbing 3-5 steps with a railing? : A Lot 6 Click Score: 17    End of Session Equipment Utilized During Treatment: Gait belt Activity Tolerance: Patient tolerated treatment well;Patient limited by pain Patient left: in bed;with call bell/phone within reach;with family/visitor present Nurse Communication: Mobility status PT Visit Diagnosis: Unsteadiness on feet (R26.81);Muscle weakness (generalized) (M62.81);Pain Pain - Right/Left: Left Pain - part of body: Knee     Time: 1217-1242 PT Time Calculation (min) (ACUTE ONLY): 25 min  Charges:  $Gait Training: 8-22 mins $Therapeutic Activity: 8-22 mins                     Iva Boop, PT  07/23/21. 12:52 PM

## 2021-07-23 NOTE — Progress Notes (Signed)
   Subjective: 5 Days Post-Op Procedure(s) (LRB): TOTAL KNEE ARTHROPLASTY (Left) Patient reports pain as mild. Doing well this am Denies any CP, SOB, ABD pain. + BM We will continue therapy today Plan is to go Skilled nursing facility after hospital stay.  Objective: Vital signs in last 24 hours: Temp:  [97.6 F (36.4 C)-98.5 F (36.9 C)] 98 F (36.7 C) (05/23 0809) Pulse Rate:  [57-110] 72 (05/23 0809) Resp:  [17-18] 18 (05/23 0809) BP: (114-142)/(64-82) 127/68 (05/23 0809) SpO2:  [97 %-99 %] 99 % (05/23 0809)  Intake/Output from previous day: 05/22 0701 - 05/23 0700 In: 200 [P.O.:200] Out: 200 [Urine:200] Intake/Output this shift: No intake/output data recorded.  Recent Labs    07/21/21 0420 07/22/21 0834  HGB 8.5* 8.5*   Recent Labs    07/21/21 0420 07/22/21 0834  WBC 5.6 4.4  RBC 2.93* 2.86*  HCT 26.6* 25.8*  PLT 150 171   No results for input(s): NA, K, CL, CO2, BUN, CREATININE, GLUCOSE, CALCIUM in the last 72 hours.  No results for input(s): LABPT, INR in the last 72 hours.  EXAM General - Patient is Alert and Oriented Extremity - Neurovascular intact Sensation intact distally Intact pulses distally Dorsiflexion/Plantar flexion intact Dressing - dressing C/D/I and no drainage, Prevena intact with no drainage Motor Function - intact, moving foot and toes well on exam.   Past Medical History:  Diagnosis Date   Anxiety    Arthritis    both knees   Cancer ([place])    basal cell of skin, thyroid cancer.  had radiation   Depression    Diabetes mellitus without complication ([place])    Dysrhythmia    skips a beat   Hyperlipemia    Hypertension    Hyperthyroidism    Neuromuscular disorder ([place])    Panic disorder    Parkinsonism ([place])    Stroke ([place]) 2001    Assessment/Plan:   5 Days Post-Op Procedure(s) (LRB): TOTAL KNEE ARTHROPLASTY (Left) Principal Problem:   S/P TKR (total knee replacement) using cement, left  Estimated body mass index is  27.47 kg/m as calculated from the following:   Height as of this encounter: '5\' 4"'$  (1.626 m).   Weight as of this encounter: 72.6 kg. Advance diet Up with therapy  Acute post op blood loss anemia - Hgb stable.continue with Iron supplement   Altered mental status - resolved.   Vital signs are stable  Care management to assist with discharge to skilled nursing facility today  DVT Prophylaxis - Lovenox, TED hose, and SCDs Weight-Bearing as tolerated to left leg   T. Rachelle Hora, PA-C [place] 07/23/2021, 9:40 AM

## 2021-07-23 NOTE — Progress Notes (Signed)
Physical Therapy Treatment Patient Details Name: Adriana Sanders MRN: [id number] DOB: [birth year] Today's Date: 07/23/2021   History of Present Illness Patient is an 81 year old female s/p left total knee arthroplasty    PT Comments    Physical Therapy session completed this date. Patient tolerated session well and was agreeable to treatment. Upon entering room patient was seated in recliner requesting to utilize the restroom. At rest pain was a 7/10 that increased with physical activity. Sit to stands from recliner x2 and BSC were completed at [facility]. Step transfers completed with CGA, and patient was able to tolerate increased distance with ambulation within the room this session (~40 feet) with CGA. Sit to supine completed at CGA (minimal assistance at the ankles) and Mod A to reposition in bed. Patient is progressing towards her goals and would continue to benefit from skilled physical therapy in order to optimize patient's return to PLOF. Continue to recommend STR upon discharge from acute hospitalization.     Recommendations for follow up therapy are one component of a multi-disciplinary discharge planning process, led by the attending physician.  Recommendations may be updated based on patient status, additional functional criteria and insurance authorization.  Follow Up Recommendations  Skilled nursing-short term rehab (<3 hours/day)     Assistance Recommended at Discharge Frequent or constant Supervision/Assistance  Patient can return home with the following     Equipment Recommendations  Other (comment) (TBD)    Recommendations for Other Services       Precautions / Restrictions Precautions Precautions: Fall Restrictions Weight Bearing Restrictions: Yes LLE Weight Bearing: Weight bearing as tolerated Other Position/Activity Restrictions: wound vac     Mobility  Bed Mobility Overal bed mobility: Needs Assistance Bed Mobility: Sit to Supine       Sit to supine: Min guard    General bed mobility comments: Mod A+1 to reposition in bed    Transfers Overall transfer level: Needs assistance Equipment used: Rolling walker (2 wheels) Transfers: Sit to/from Stand Sit to Stand: Min guard           General transfer comment: CGA from recliner x2 and BSC    Ambulation/Gait Ambulation/Gait assistance: Min guard Gait Distance (Feet): 40 Feet Assistive device: Rolling walker (2 wheels) Gait Pattern/deviations: Step-to pattern, Antalgic Gait velocity: decreased     General Gait Details: very short, shuffled steps, x10 feet from recliner to BSC, x20 feet to doorway and back, x10 from recliner to EOB   Stairs             Wheelchair Mobility    Modified Rankin (Stroke Patients Only)       Balance Overall balance assessment: Needs assistance Sitting-balance support: Bilateral upper extremity supported, Feet supported Sitting balance-Leahy Scale: Fair     Standing balance support: During functional activity, Reliant on assistive device for balance, Bilateral upper extremity supported Standing balance-Leahy Scale: Fair Standing balance comment: able to minimally stand for pericare, CGA-minA for steadying                            Cognition Arousal/Alertness: Awake/alert Behavior During Therapy: WFL for tasks assessed/performed Overall Cognitive Status: Within Functional Limits for tasks assessed                                          Exercises      General  Comments        Pertinent Vitals/Pain Pain Assessment Pain Score: 8  Pain Location: L knee Pain Descriptors / Indicators: Discomfort, Aching, Sore, Tightness Pain Intervention(s): Monitored during session, Limited activity within patient's tolerance, Repositioned    Home Living                          Prior Function            PT Goals (current goals can now be found in the care plan section) Acute Rehab PT Goals Patient Stated  Goal: patient did not state a goal PT Goal Formulation: With patient Time For Goal Achievement: 08/01/21 Potential to Achieve Goals: Fair Additional Goals Additional Goal #1: Patient will demonstrate all bed mobility at Mod I to increase ease and independence with all bed mobility transfers Progress towards PT goals: Progressing toward goals    Frequency    BID      PT Plan Current plan remains appropriate    Co-evaluation              AM-PAC PT "6 Clicks" Mobility   Outcome Measure  Help needed turning from your back to your side while in a flat bed without using bedrails?: A Little Help needed moving from lying on your back to sitting on the side of a flat bed without using bedrails?: A Little Help needed moving to and from a bed to a chair (including a wheelchair)?: A Little Help needed standing up from a chair using your arms (e.g., wheelchair or bedside chair)?: A Little Help needed to walk in hospital room?: A Little Help needed climbing 3-5 steps with a railing? : Total 6 Click Score: 16    End of Session Equipment Utilized During Treatment: Gait belt Activity Tolerance: Patient tolerated treatment well;Patient limited by pain Patient left: in bed;with nursing/sitter in room;with call bell/phone within reach Nurse Communication: Mobility status PT Visit Diagnosis: Unsteadiness on feet (R26.81);Muscle weakness (generalized) (M62.81);Pain Pain - Right/Left: Left Pain - part of body: Knee     Time: 0912-0932 PT Time Calculation (min) (ACUTE ONLY): 20 min  Charges:  $Therapeutic Activity: 8-22 mins                     Iva Boop, PT  07/23/21. 9:45 AM

## 2021-07-24 DIAGNOSIS — Z87440 Personal history of urinary (tract) infections: Secondary | ICD-10-CM | POA: Insufficient documentation

## 2021-07-24 DIAGNOSIS — E0590 Thyrotoxicosis, unspecified without thyrotoxic crisis or storm: Secondary | ICD-10-CM | POA: Insufficient documentation

## 2021-07-24 DIAGNOSIS — F410 Panic disorder [episodic paroxysmal anxiety] without agoraphobia: Secondary | ICD-10-CM | POA: Insufficient documentation

## 2021-07-24 DIAGNOSIS — E1165 Type 2 diabetes mellitus with hyperglycemia: Secondary | ICD-10-CM | POA: Insufficient documentation

## 2021-07-24 DIAGNOSIS — J302 Other seasonal allergic rhinitis: Secondary | ICD-10-CM | POA: Insufficient documentation

## 2021-07-26 LAB — BLOOD GAS, VENOUS
Acid-base deficit: 0.6 mmol/L (ref 0.0–2.0)
Bicarbonate: 27.8 mmol/L (ref 20.0–28.0)
Patient temperature: 37
pCO2, Ven: 62 mmHg — ABNORMAL HIGH (ref 44–60)
pH, Ven: 7.26 (ref 7.25–7.43)

## 2021-12-13 ENCOUNTER — Ambulatory Visit (INDEPENDENT_AMBULATORY_CARE_PROVIDER_SITE_OTHER): Payer: Medicare Other

## 2021-12-13 ENCOUNTER — Ambulatory Visit
Admission: EM | Admit: 2021-12-13 | Discharge: 2021-12-13 | Disposition: A | Discharge status: Home / Self Care | Payer: Medicare Other

## 2021-12-13 DIAGNOSIS — M25571 Pain in right ankle and joints of right foot: Secondary | ICD-10-CM | POA: Diagnosis not present

## 2021-12-13 DIAGNOSIS — Z8673 Personal history of transient ischemic attack (TIA), and cerebral infarction without residual deficits: Secondary | ICD-10-CM | POA: Insufficient documentation

## 2021-12-13 NOTE — ED Provider Notes (Signed)
Roderic Palau    CSN: [id number] Arrival date & time: 12/13/21  1410      History   Chief Complaint Chief Complaint  Patient presents with   Foot Pain    HPI Adriana Sanders is a 81 y.o. female.    Foot Pain  Patient is accompanied by her daughter.  She presents to UC with complaint of acute right ankle pain following an injury which occurred when she stumbled while walking.  She states she turned her ankle when she stepped on an uneven even place in the pavement.  Patient with history of Parkinson's and knee replacement and endorses unstable gait, uses walker for ambulation.  Past Medical History:  Diagnosis Date   Anxiety    Arthritis    both knees   Cancer ([place])    basal cell of skin, thyroid cancer.  had radiation   Depression    Diabetes mellitus without complication ([place])    Dysrhythmia    skips a beat   Hyperlipemia    Hypertension    Hyperthyroidism    Neuromuscular disorder ([place])    Panic disorder    Parkinsonism    Stroke [facility]) 2001    Patient Active Problem List   Diagnosis Date Noted   History of stroke 12/13/2021   S/P TKR (total knee replacement) using cement, left 07/18/2021   Thyroid disease 11/01/2020   Encephalopathy 05/30/2020   S/P TKR (total knee replacement) using cement, right 05/22/2020   Leg pain 01/14/2019   PAD (peripheral artery disease) ([place]) 01/14/2019   Anxiety 01/10/2019   Depression 01/10/2019   Diabetes mellitus type 2, uncomplicated ([place]) 55/73/2202   Hyperlipidemia 01/10/2019   Hypertension 01/10/2019   Panic disorder 01/10/2019   Stroke ([place]) 01/10/2019   Parkinson disease 01/10/2019   S/P hip hemiarthroplasty 06/13/2017   Age-related osteoporosis with current pathological fracture with routine healing 04/27/2017   Closed left hip fracture ([place]) 04/22/2017   Idiopathic osteoarthritis 01/09/2014    Past Surgical History:  Procedure Laterality Date   ANTERIOR APPROACH HEMI HIP ARTHROPLASTY Left 04/23/2017    Procedure: ANTERIOR APPROACH HEMI HIP ARTHROPLASTY;  Surgeon: Hessie Knows, MD;  Location: [facility] ORS;  Service: Orthopedics;  Laterality: Left;   EYE SURGERY Bilateral    cataract extractions   TOTAL KNEE ARTHROPLASTY Right 05/22/2020   Procedure: TOTAL KNEE ARTHROPLASTY - Rachelle Hora to Assist;  Surgeon: Hessie Knows, MD;  Location: [facility] ORS;  Service: Orthopedics;  Laterality: Right;   TOTAL KNEE ARTHROPLASTY Left 07/18/2021   Procedure: TOTAL KNEE ARTHROPLASTY;  Surgeon: Hessie Knows, MD;  Location: [facility] ORS;  Service: Orthopedics;  Laterality: Left;    OB History   No obstetric history on file.      Home Medications    Prior to Admission medications   Medication Sig Start Date End Date Taking? Authorizing Provider  ALPRAZolam Duanne Moron) 1 MG tablet Take 1 tablet by mouth daily as needed. 08/23/21  Yes [provider]  clopidogrel (PLAVIX) 75 MG tablet Take by mouth. 08/01/21 08/01/22 Yes [provider]  FLUoxetine (PROZAC) 20 MG capsule Take 3 capsules by mouth daily. 09/23/21  Yes [provider]  metFORMIN (GLUCOPHAGE) 500 MG tablet Take 1 tablet by mouth 2 (two) times daily. 10/28/21  Yes [provider]  methocarbamol (ROBAXIN) 500 MG tablet Take by mouth. 07/20/21  Yes [provider]  acetaminophen (TYLENOL) 500 MG tablet Take 2 tablets (1,000 mg total) by mouth every 6 (six) hours. 07/20/21   Duanne Guess,  PA-C  ALPRAZolam (XANAX) 1 MG tablet Take 1 mg by mouth daily as needed for anxiety. 04/13/17   [provider]  aspirin 81 MG chewable tablet Chew 1 tablet (81 mg total) by mouth 2 (two) times daily. Patient not taking: Reported on 07/08/2021 05/24/20   Duanne Guess, PA-C  atorvastatin (LIPITOR) 10 MG tablet Take 10 mg by mouth daily. 02/13/17   [provider]  bisacodyl (DULCOLAX) 5 MG EC tablet Take 1 tablet (5 mg total) by mouth daily as needed for moderate constipation. Patient not taking: Reported on 07/03/2021  04/24/17   Duanne Guess, PA-C  carbidopa-levodopa (SINEMET IR) 25-100 MG tablet Take 1 tablet by mouth 3 (three) times daily. 12/15/18 07/08/21  [provider]  cephALEXin (KEFLEX) 500 MG capsule Take 1 capsule (500 mg total) by mouth every 6 (six) hours. 07/20/21   Duanne Guess, PA-C  Cholecalciferol 4000 units CAPS Take 4,000 Units by mouth daily.    [provider]  diclofenac Sodium (VOLTAREN) 1 % GEL Apply 4 g topically daily as needed for pain. 01/24/21   [provider]  dipyridamole-aspirin (AGGRENOX) 200-25 MG 12hr capsule Take 1 capsule by mouth 2 (two) times daily. 09/01/18   [provider]  enoxaparin (LOVENOX) 40 MG/0.4ML injection Inject 0.4 mLs (40 mg total) into the skin daily for 14 days. 07/20/21 08/03/21  Duanne Guess, PA-C  ferrous [id number] C-folic acid (TRINSICON / FOLTRIN) capsule Take 1 capsule by mouth 2 (two) times daily. 07/20/21   Duanne Guess, PA-C  fexofenadine (ALLEGRA) 180 MG tablet Take 180 mg by mouth daily.    [provider]  FLUoxetine (PROZAC) 20 MG capsule Take 60 mg by mouth daily. 03/11/21   [provider]  FLUoxetine (PROZAC) 40 MG capsule Take 40 mg by mouth daily. 02/13/17   [provider]  fluticasone (FLONASE) 50 MCG/ACT nasal spray Place 2 sprays into both nostrils daily.    [provider]  lisinopril-hydrochlorothiazide (PRINZIDE,ZESTORETIC) 20-25 MG tablet Take 1 tablet by mouth daily.    [provider]  metFORMIN (GLUCOPHAGE) 500 MG tablet Take 500 mg by mouth 2 (two) times daily with a meal.    [provider]  methimazole (TAPAZOLE) 10 MG tablet Take 10 mg by mouth daily.    [provider]  methocarbamol (ROBAXIN) 500 MG tablet Take 1 tablet (500 mg total) by mouth every 6 (six) hours as needed for muscle spasms. 07/20/21   Duanne Guess, PA-C  metoprolol tartrate (LOPRESSOR) 100 MG tablet Take 100 mg by mouth 2 (two) times  daily with a meal. 02/13/17   [provider]  oxyCODONE (OXY IR/ROXICODONE) 5 MG immediate release tablet Take 0.5-1 tablets (2.5-5 mg total) by mouth every 6 (six) hours as needed for moderate pain (pain score 4-6). 07/20/21   Duanne Guess, PA-C  pioglitazone (ACTOS) 15 MG tablet Take 15 mg by mouth daily.    [provider]  Polyethyl Glycol-Propyl Glycol (SYSTANE OP) Place 1 drop into both eyes daily.    [provider]  traZODone (DESYREL) 100 MG tablet Take 100 mg by mouth at bedtime. 03/26/21   [provider]  vitamin B-12 (CYANOCOBALAMIN) 500 MCG tablet Take 500 mcg by mouth daily.    [provider]    Family History History reviewed. No pertinent family history.  Social History Social History   Tobacco Use   Smoking status: Former    Packs/day: 1.00  Types: Cigarettes    Quit date: 2019    Years since quitting: 4.7   Smokeless tobacco: Never  Vaping Use   Vaping Use: Never used  Substance Use Topics   Alcohol use: No   Drug use: No     Allergies   Vicodin [hydrocodone-acetaminophen]   Review of Systems Review of Systems   Physical Exam Triage Vital Signs ED Triage Vitals [12/13/21 1425]  Enc Vitals Group     BP 91/65     Pulse Rate (!) 57     Resp 14     Temp 98.2 F (36.8 C)     Temp src      SpO2 94 %     Weight      Height      Head Circumference      Peak Flow      Pain Score 10     Pain Loc      Pain Edu?      Excl. in [place]?    No data found.  Updated Vital Signs BP 91/65   Pulse (!) 57   Temp 98.2 F (36.8 C)   Resp 14   SpO2 94%   Visual Acuity Right Eye Distance:   Left Eye Distance:   Bilateral Distance:    Right Eye Near:   Left Eye Near:    Bilateral Near:     Physical Exam Vitals reviewed.  Constitutional:      Appearance: Normal appearance.  Musculoskeletal:     Right ankle: No swelling or deformity. Tenderness present over the lateral malleolus. Decreased range of  motion.     Right Achilles Tendon: Tenderness present.     Comments: There is no swelling at her ankle or Achilles.  There is tenderness at the lateral malleolus.  Skin:    General: Skin is warm and dry.  Neurological:     General: No focal deficit present.     Mental Status: She is alert and oriented to person, place, and time.     Comments: Tremors present.  Psychiatric:        Mood and Affect: Mood normal.        Behavior: Behavior normal.      UC Treatments / Results  Labs (all labs ordered are listed, but only abnormal results are displayed) Labs Reviewed - No data to display  EKG   Radiology No results found.  Procedures Procedures (including critical care time)  Medications Ordered in UC Medications - No data to display  Initial Impression / Assessment and Plan / UC Course  I have reviewed the triage vital signs and the nursing notes.  Pertinent labs & imaging results that were available during my care of the patient were reviewed by me and considered in my medical decision making (see chart for details).   X-rays obtained at their request.  Suspect right ankle sprain/strain.  Recommending ICE and reviewed treatment plan of cold therapy, lace up brace for compression and stability, elevation while at rest.  Encourage patient to sleep downstairs so as to avoid stress of climbing stairway to her bedroom.   Final Clinical Impressions(s) / UC Diagnoses   Final diagnoses:  Acute right ankle pain   Discharge Instructions   None    ED Prescriptions   None    PDMP not reviewed this encounter.   Rose Phi, [place] 12/13/21 [phone]

## 2021-12-13 NOTE — Discharge Instructions (Addendum)
Recommend you sleep downstairs in your home to avoid stress of climbing stairway.  Use ibuprofen or Aleve as needed for discomfort.  Apply cold to your ankle, wear compression brace, keep your ankle elevated when at rest.

## 2021-12-13 NOTE — ED Triage Notes (Signed)
Pt. Presents to UC w/ c/o right ankle pain. Pt.states she was outside exercising Monday and rolled her right ankle. Pt. Endorses pain.

## 2022-04-16 ENCOUNTER — Other Ambulatory Visit: Payer: Self-pay | Admitting: Neurology

## 2022-04-16 DIAGNOSIS — G20C Parkinsonism, unspecified: Secondary | ICD-10-CM

## 2022-04-21 ENCOUNTER — Ambulatory Visit
Admission: RE | Admit: 2022-04-21 | Discharge: 2022-04-21 | Disposition: A | Discharge status: Home / Self Care | Payer: Medicare Other | Source: Ambulatory Visit | Attending: Neurology | Admitting: Neurology

## 2022-04-21 DIAGNOSIS — G20C Parkinsonism, unspecified: Secondary | ICD-10-CM

## 2022-04-28 ENCOUNTER — Ambulatory Visit
Admission: EM | Admit: 2022-04-28 | Discharge: 2022-04-28 | Disposition: A | Discharge status: Home / Self Care | Payer: Medicare Other | Attending: Urgent Care | Admitting: Urgent Care

## 2022-04-28 DIAGNOSIS — N3000 Acute cystitis without hematuria: Secondary | ICD-10-CM | POA: Diagnosis present

## 2022-04-28 LAB — POCT URINALYSIS DIP (MANUAL ENTRY)
Bilirubin, UA: NEGATIVE
Glucose, UA: NEGATIVE mg/dL
Nitrite, UA: POSITIVE — AB
Protein Ur, POC: NEGATIVE mg/dL
Spec Grav, UA: 1.025 (ref 1.010–1.025)
Urobilinogen, UA: 1 E.U./dL
pH, UA: 5.5 (ref 5.0–8.0)

## 2022-04-28 MED ORDER — CEPHALEXIN 500 MG PO CAPS: 500.0000 mg | ORAL_CAPSULE | Freq: Four times a day (QID) | ORAL | 0 refills | Status: AC

## 2022-04-28 NOTE — Discharge Instructions (Signed)
Follow up here or with your primary care provider if your symptoms are worsening or not improving with treatment.          

## 2022-04-28 NOTE — ED Triage Notes (Signed)
Patient presents to UC for urinary freq since yesterday and altered mental status since today. Hx of UTI. Not taking any OTC meds.

## 2022-04-28 NOTE — ED Provider Notes (Signed)
Adriana Sanders    CSN: [id number] Arrival date & time: 04/28/22  1906      History   Chief Complaint Chief Complaint  Patient presents with   Altered Mental Status   Urinary Frequency    HPI Adriana Sanders is a 82 y.o. female.   HPI  Presents to urgent care with complaint of urinary frequency starting yesterday.  She is accompanied by her daughter who endorses altered mental status starting today.  Denies fever, chills, body aches.  Denies abdominal pain.  Endorses some flank pain.  Past Medical History:  Diagnosis Date   Anxiety    Arthritis    both knees   Cancer ([place])    basal cell of skin, thyroid cancer.  had radiation   Depression    Diabetes mellitus without complication ([place])    Dysrhythmia    skips a beat   Hyperlipemia    Hypertension    Hyperthyroidism    Neuromuscular disorder ([place])    Panic disorder    Parkinsonism    Stroke [facility]) 2001    Patient Active Problem List   Diagnosis Date Noted   History of stroke 12/13/2021   Thyrotoxicosis, unspecified without thyrotoxic crisis or storm 07/24/2021   Type 2 diabetes mellitus with hyperglycemia ([place]) 07/24/2021   Personal history of urinary (tract) infections 07/24/2021   Panic disorder (episodic paroxysmal anxiety) 07/24/2021   Seasonal allergic rhinitis 07/24/2021   S/P TKR (total knee replacement) using cement, left 07/18/2021   Unspecified osteoarthritis, unspecified site 07/18/2021   Presence of artificial knee joint, bilateral 07/18/2021   Thyroid disease 11/01/2020   Encephalopathy 05/30/2020   S/P TKR (total knee replacement) using cement, right 05/22/2020   Personal history of nicotine dependence 03/03/2020   Leg pain 01/14/2019   PAD (peripheral artery disease) ([place]) 01/14/2019   Type 2 diabetes mellitus with diabetic peripheral angiopathy without gangrene ([place]) 01/14/2019   Anxiety 01/10/2019   Depression 01/10/2019   Diabetes mellitus type 2, uncomplicated ([place]) Q000111Q    Hyperlipidemia 01/10/2019   Hypertension 01/10/2019   Panic disorder 01/10/2019   Stroke ([place]) 01/10/2019   Parkinson disease 01/10/2019   Personal history of malignant neoplasm of thyroid 03/03/2018   Personal history of irradiation 03/03/2018   S/P hip hemiarthroplasty 06/13/2017   Age-related osteoporosis with current pathological fracture with routine healing 04/27/2017   Presence of left artificial hip joint 04/23/2017   Personal history of (healed) osteoporosis fracture 04/23/2017   Closed left hip fracture ([place]) 04/22/2017   Idiopathic osteoarthritis 01/09/2014   Aftercare following joint replacement surgery 08/04/2000    Past Surgical History:  Procedure Laterality Date   ANTERIOR APPROACH HEMI HIP ARTHROPLASTY Left 04/23/2017   Procedure: ANTERIOR APPROACH HEMI HIP ARTHROPLASTY;  Surgeon: Hessie Knows, MD;  Location: [facility] ORS;  Service: Orthopedics;  Laterality: Left;   EYE SURGERY Bilateral    cataract extractions   TOTAL KNEE ARTHROPLASTY Right 05/22/2020   Procedure: TOTAL KNEE ARTHROPLASTY - Rachelle Hora to Assist;  Surgeon: Hessie Knows, MD;  Location: [facility] ORS;  Service: Orthopedics;  Laterality: Right;   TOTAL KNEE ARTHROPLASTY Left 07/18/2021   Procedure: TOTAL KNEE ARTHROPLASTY;  Surgeon: Hessie Knows, MD;  Location: [facility] ORS;  Service: Orthopedics;  Laterality: Left;    OB History   No obstetric history on file.      Home Medications    Prior to Admission medications   Medication Sig Start Date End Date Taking? Authorizing Provider  acetaminophen (TYLENOL) 500 MG tablet Take 2 tablets (  1,000 mg total) by mouth every 6 (six) hours. 07/20/21   Duanne Guess, PA-C  ALPRAZolam Duanne Moron) 1 MG tablet Take 1 mg by mouth daily as needed for anxiety. 04/13/17   [provider]  ALPRAZolam Duanne Moron) 1 MG tablet Take 1 tablet by mouth daily as needed. 08/23/21   [provider]  aspirin 81 MG chewable tablet Chew 1 tablet (81 mg total) by mouth 2 (two)  times daily. Patient not taking: Reported on 07/08/2021 05/24/20   Duanne Guess, PA-C  atorvastatin (LIPITOR) 10 MG tablet Take 10 mg by mouth daily. 02/13/17   [provider]  bisacodyl (DULCOLAX) 5 MG EC tablet Take 1 tablet (5 mg total) by mouth daily as needed for moderate constipation. Patient not taking: Reported on 07/03/2021 04/24/17   Duanne Guess, PA-C  carbidopa-levodopa (SINEMET IR) 25-100 MG tablet Take 1 tablet by mouth 3 (three) times daily. 12/15/18 07/08/21  [provider]  cephALEXin (KEFLEX) 500 MG capsule Take 1 capsule (500 mg total) by mouth every 6 (six) hours. 07/20/21   Duanne Guess, PA-C  Cholecalciferol 4000 units CAPS Take 4,000 Units by mouth daily.    [provider]  clopidogrel (PLAVIX) 75 MG tablet Take by mouth. 08/01/21 08/01/22  [provider]  diclofenac Sodium (VOLTAREN) 1 % GEL Apply 4 g topically daily as needed for pain. 01/24/21   [provider]  dipyridamole-aspirin (AGGRENOX) 200-25 MG 12hr capsule Take 1 capsule by mouth 2 (two) times daily. 09/01/18   [provider]  enoxaparin (LOVENOX) 40 MG/0.4ML injection Inject 0.4 mLs (40 mg total) into the skin daily for 14 days. 07/20/21 08/03/21  Duanne Guess, PA-C  ferrous Q000111Q C-folic acid (TRINSICON / FOLTRIN) capsule Take 1 capsule by mouth 2 (two) times daily. 07/20/21   Duanne Guess, PA-C  fexofenadine (ALLEGRA) 180 MG tablet Take 180 mg by mouth daily.    [provider]  FLUoxetine (PROZAC) 20 MG capsule Take 60 mg by mouth daily. 03/11/21   [provider]  FLUoxetine (PROZAC) 20 MG capsule Take 3 capsules by mouth daily. 09/23/21   [provider]  FLUoxetine (PROZAC) 40 MG capsule Take 40 mg by mouth daily. 02/13/17   [provider]  fluticasone (FLONASE) 50 MCG/ACT nasal spray Place 2 sprays into both nostrils daily.    [provider]  lisinopril-hydrochlorothiazide  (PRINZIDE,ZESTORETIC) 20-25 MG tablet Take 1 tablet by mouth daily.    [provider]  metFORMIN (GLUCOPHAGE) 500 MG tablet Take 500 mg by mouth 2 (two) times daily with a meal.    [provider]  metFORMIN (GLUCOPHAGE) 500 MG tablet Take 1 tablet by mouth 2 (two) times daily. 10/28/21   [provider]  methimazole (TAPAZOLE) 10 MG tablet Take 10 mg by mouth daily.    [provider]  methocarbamol (ROBAXIN) 500 MG tablet Take 1 tablet (500 mg total) by mouth every 6 (six) hours as needed for muscle spasms. 07/20/21   Duanne Guess, PA-C  methocarbamol (ROBAXIN) 500 MG tablet Take by mouth. 07/20/21   [provider]  metoprolol tartrate (LOPRESSOR) 100 MG tablet Take 100 mg by mouth 2 (two) times daily with a meal. 02/13/17   [provider]  oxyCODONE (OXY IR/ROXICODONE) 5 MG immediate release tablet Take 0.5-1 tablets (2.5-5 mg total) by mouth every 6 (six) hours as needed for moderate pain (pain score 4-6). 07/20/21   Duanne Guess, PA-C  pioglitazone (ACTOS) 15  MG tablet Take 15 mg by mouth daily.    [provider]  Polyethyl Glycol-Propyl Glycol (SYSTANE OP) Place 1 drop into both eyes daily.    [provider]  traZODone (DESYREL) 100 MG tablet Take 100 mg by mouth at bedtime. 03/26/21   [provider]  vitamin B-12 (CYANOCOBALAMIN) 500 MCG tablet Take 500 mcg by mouth daily.    [provider]    Family History History reviewed. No pertinent family history.  Social History Social History   Tobacco Use   Smoking status: Former    Packs/day: 1.00    Types: Cigarettes    Quit date: 2019    Years since quitting: 5.1   Smokeless tobacco: Never  Vaping Use   Vaping Use: Never used  Substance Use Topics   Alcohol use: No   Drug use: No     Allergies   Vicodin [hydrocodone-acetaminophen]   Review of Systems Review of Systems   Physical Exam Triage Vital Signs ED Triage Vitals   Enc Vitals Group     BP 04/28/22 1919 (!) 156/71     Pulse Rate 04/28/22 1919 63     Resp 04/28/22 1919 16     Temp 04/28/22 1919 97.8 F (36.6 C)     Temp Source 04/28/22 1919 Temporal     SpO2 04/28/22 1919 93 %     Weight --      Height --      Head Circumference --      Peak Flow --      Pain Score 04/28/22 1918 0     Pain Loc --      Pain Edu? --      Excl. in [place]? --    No data found.  Updated Vital Signs BP (!) 156/71 (BP Location: Left Arm)   Pulse 63   Temp 97.8 F (36.6 C) (Temporal)   Resp 16   SpO2 93%   Visual Acuity Right Eye Distance:   Left Eye Distance:   Bilateral Distance:    Right Eye Near:   Left Eye Near:    Bilateral Near:     Physical Exam   UC Treatments / Results  Labs (all labs ordered are listed, but only abnormal results are displayed) Labs Reviewed  POCT URINALYSIS DIP (MANUAL ENTRY)    EKG   Radiology No results found.  Procedures Procedures (including critical care time)  Medications Ordered in UC Medications - No data to display  Initial Impression / Assessment and Plan / UC Course  I have reviewed the triage vital signs and the nursing notes.  Pertinent labs & imaging results that were available during my care of the patient were reviewed by me and considered in my medical decision making (see chart for details).   UA indicative for UTI with small Leukocytes. Will treat with Keflex whilst awaiting results of urine culture.   Final Clinical Impressions(s) / UC Diagnoses   Final diagnoses:  None   Discharge Instructions   None    ED Prescriptions   None    PDMP not reviewed this encounter.   Rose Phi, [place] 04/28/22 [phone]

## 2022-04-30 LAB — URINE CULTURE: Culture: >=100000 — AB

## 2022-12-20 ENCOUNTER — Emergency Department
Admission: EM | Admit: 2022-12-20 | Discharge: 2022-12-21 | Disposition: A | Discharge status: Home / Self Care | Payer: Medicare Other | Attending: Emergency Medicine | Admitting: Emergency Medicine

## 2022-12-20 ENCOUNTER — Emergency Department: Payer: Medicare Other

## 2022-12-20 ENCOUNTER — Other Ambulatory Visit: Payer: Self-pay

## 2022-12-20 DIAGNOSIS — I10 Essential (primary) hypertension: Secondary | ICD-10-CM | POA: Insufficient documentation

## 2022-12-20 DIAGNOSIS — S0990XA Unspecified injury of head, initial encounter: Secondary | ICD-10-CM | POA: Diagnosis not present

## 2022-12-20 DIAGNOSIS — E119 Type 2 diabetes mellitus without complications: Secondary | ICD-10-CM | POA: Insufficient documentation

## 2022-12-20 DIAGNOSIS — Z8673 Personal history of transient ischemic attack (TIA), and cerebral infarction without residual deficits: Secondary | ICD-10-CM | POA: Diagnosis not present

## 2022-12-20 DIAGNOSIS — S32591A Other specified fracture of right pubis, initial encounter for closed fracture: Secondary | ICD-10-CM | POA: Insufficient documentation

## 2022-12-20 DIAGNOSIS — M25551 Pain in right hip: Secondary | ICD-10-CM

## 2022-12-20 DIAGNOSIS — Z96642 Presence of left artificial hip joint: Secondary | ICD-10-CM | POA: Diagnosis not present

## 2022-12-20 DIAGNOSIS — W19XXXA Unspecified fall, initial encounter: Secondary | ICD-10-CM

## 2022-12-20 DIAGNOSIS — Y92010 Kitchen of single-family (private) house as the place of occurrence of the external cause: Secondary | ICD-10-CM | POA: Insufficient documentation

## 2022-12-20 DIAGNOSIS — W1830XA Fall on same level, unspecified, initial encounter: Secondary | ICD-10-CM | POA: Insufficient documentation

## 2022-12-20 DIAGNOSIS — I6782 Cerebral ischemia: Secondary | ICD-10-CM | POA: Diagnosis not present

## 2022-12-20 LAB — CBC WITH DIFFERENTIAL/PLATELET
Abs Immature Granulocytes: 0.02 10*3/uL (ref 0.00–0.07)
Basophils Absolute: 0 10*3/uL (ref 0.0–0.1)
Basophils Relative: 0 %
Eosinophils Absolute: 0.1 10*3/uL (ref 0.0–0.5)
Eosinophils Relative: 1 %
HCT: 38.9 % (ref 36.0–46.0)
Hemoglobin: 12.4 g/dL (ref 12.0–15.0)
Immature Granulocytes: 0 %
Lymphocytes Relative: 10 %
Lymphs Abs: 0.8 10*3/uL (ref 0.7–4.0)
MCH: 25.9 pg — ABNORMAL LOW (ref 26.0–34.0)
MCHC: 31.9 g/dL (ref 30.0–36.0)
MCV: 81.4 fL (ref 80.0–100.0)
Monocytes Absolute: 0.7 10*3/uL (ref 0.1–1.0)
Monocytes Relative: 8 %
Neutro Abs: 6.8 10*3/uL (ref 1.7–7.7)
Neutrophils Relative %: 81 %
Platelets: 154 10*3/uL (ref 150–400)
RBC: 4.78 MIL/uL (ref 3.87–5.11)
RDW: 15.8 % — ABNORMAL HIGH (ref 11.5–15.5)
WBC: 8.4 10*3/uL (ref 4.0–10.5)
nRBC: 0 % (ref 0.0–0.2)

## 2022-12-20 LAB — BASIC METABOLIC PANEL
Anion gap: 12 (ref 5–15)
BUN: 17 mg/dL (ref 8–23)
CO2: 24 mmol/L (ref 22–32)
Calcium: 9.2 mg/dL (ref 8.9–10.3)
Chloride: 98 mmol/L (ref 98–111)
Creatinine, Ser: 0.82 mg/dL (ref 0.44–1.00)
GFR, Estimated: >60 mL/min (ref >60)
Glucose, Bld: 148 mg/dL — ABNORMAL HIGH (ref 70–99)
Potassium: 3.6 mmol/L (ref 3.5–5.1)
Sodium: 134 mmol/L — ABNORMAL LOW (ref 135–145)

## 2022-12-20 MED ORDER — MORPHINE SULFATE (PF) 2 MG/ML IV SOLN
2.0000 mg | Freq: Once | INTRAVENOUS | Status: AC
  Administered 2022-12-20: 2 mg via INTRAMUSCULAR

## 2022-12-20 MED ORDER — MORPHINE SULFATE (PF) 2 MG/ML IV SOLN
2.0000 mg | Freq: Once | INTRAVENOUS | Status: DC
  Filled 2022-12-20: qty 1

## 2022-12-20 MED ORDER — ACETAMINOPHEN 500 MG PO TABS
1000.0000 mg | ORAL_TABLET | Freq: Once | ORAL | Status: AC
  Administered 2022-12-20: 1000 mg via ORAL
  Filled 2022-12-20: qty 2

## 2022-12-20 NOTE — ED Provider Notes (Signed)
[facility] Provider Note    Event Date/Time   First MD Initiated Contact with Patient 12/20/22 1817     (approximate)   History   Fall   HPI  Adriana Sanders is a 82 y.o. female with PMH of diabetes, hypertension, depression, anxiety, stroke, left hip arthroplasty presents for evaluation after a fall.  Patient states that she was bending down in her kitchen to pick something up off the floor when she lost her balance falling forward completing a somersault.  She did hit her head, no LOC.  Unsure if she is on any blood thinners.  She states that most of her pain is in her lower back and specifically at her right hip.      Physical Exam   Triage Vital Signs: ED Triage Vitals  Encounter Vitals Group     BP 12/20/22 1720 120/79     Systolic BP Percentile --      Diastolic BP Percentile --      Pulse Rate 12/20/22 1720 (!) 53     Resp 12/20/22 1718 18     Temp 12/20/22 1720 98.4 F (36.9 C)     Temp src --      SpO2 12/20/22 1720 93 %     Weight 12/20/22 1719 147 lb (66.7 kg)     Height 12/20/22 1719 5\' 3"  (1.6 m)     Head Circumference --      Peak Flow --      Pain Score 12/20/22 1719 7     Pain Loc --      Pain Education --      Exclude from Growth Chart --     Most recent vital signs: Vitals:   12/20/22 2233 12/20/22 2338  BP:  (!) 152/85  Pulse:  67  Resp:  18  Temp: 98.4 F (36.9 C)   SpO2:  95%    General: Awake, no distress.  CV:  Good peripheral perfusion.  RRR. Resp:  Normal effort.  CTAB. Abd:  No distention.  Other:  PERRL.  EOM intact, no focal neurodeficits. RLE:  No tenderness to palpation over the right hip or lateral right thigh, mild tenderness to palpation along the medial thigh.  Dorsalis pedis pulse 2+ and regular.  5/5 strength when compared to the left.  Patient is able to fully lift her leg up off the bed.  Unable to bear weight on the right leg without pain.   ED Results / Procedures / Treatments    Labs (all labs ordered are listed, but only abnormal results are displayed) Labs Reviewed  CBC WITH DIFFERENTIAL/PLATELET - Abnormal; Notable for the following components:      Result Value   MCH 25.9 (*)    RDW 15.8 (*)    All other components within normal limits  BASIC METABOLIC PANEL - Abnormal; Notable for the following components:   Sodium 134 (*)    Glucose, Bld 148 (*)    All other components within normal limits     RADIOLOGY  CT head, CT cervical spine, hip x-ray and hip CT obtained, I interpreted the images as well as reviewed the radiologist report.  CT of the head and neck did not show any acute intracranial abnormalities or cervical spine fractures.  Hip x-ray shows degenerative changes but no fractures or dislocation.  Hip CT pending.   PROCEDURES:  Critical Care performed: No  Procedures   MEDICATIONS ORDERED IN ED: Medications  acetaminophen (TYLENOL) tablet  1,000 mg (1,000 mg Oral Given 12/20/22 1921)  morphine (PF) 2 MG/ML injection 2 mg (2 mg Intramuscular Given 12/20/22 2336)     IMPRESSION / MDM / ASSESSMENT AND PLAN / ED COURSE  I reviewed the triage vital signs and the nursing notes.                             82 year old female presents for evaluation after a fall and reports right hip pain.  Vital signs stable in triage patient in mild distress on exam due to pain.  Differential diagnosis includes, but is not limited to, hip fracture, hip dislocation, osteoarthritis, muscle strain, concussion, head injury.  Patient's presentation is most consistent with acute complicated illness / injury requiring diagnostic workup.  Head and neck CT obtained, interpreted the images as well as reviewed the radiologist report which was negative.  Hip x-ray obtained, I interpreted the images as well as reviewed the radiologist report which did not show any acute fractures but did show degenerative changes.  Given that patient is unable to bear weight I  ordered a hip CT to look for an occult fracture.  Results are pending.  Patient was given Tylenol and was unable to bear weight due to pain. I will try morphine and see if this provides enough pain relief for her to bear weight.   If her CT scan is negative for fracture she can be discharged with oral pain medication or will need to be admitted for pain control. Care of the patient will be passed off to the oncoming provider, Dr. York Cerise.      FINAL CLINICAL IMPRESSION(S) / ED DIAGNOSES   Final diagnoses:  Pain of right hip     Rx / DC Orders   ED Discharge Orders     None        Note:  This document was prepared using Dragon voice recognition software and may include unintentional dictation errors.   Cameron Ali, PA-C 12/20/22 2359    Chesley Noon, MD 12/21/22 [phone]

## 2022-12-20 NOTE — ED Provider Notes (Signed)
-----------------------------------------   11:13 PM on 12/20/2022 -----------------------------------------  Assuming care from [employer].  In short, Adriana Sanders is a 82 y.o. female with a chief complaint of pain after fall.  Refer to the original H&P for additional details.  The current plan of care is to follow up CT and reassess patient's ability to ambulate after adequate pain control.   Clinical Course as of 12/21/22 [id number]  Wynelle Link Dec 21, 2022  0211 I viewed and interpreted the patient's CT right hip and see a small crack in the patient's right inferior pubic ramus.  Radiology confirmed that there is a nondisplaced inferior right pubic ramus fracture.  I updated the patient and her daughter about the details.  We talked about various options including admission for pain control versus outpatient management and I explained that it is very unlikely that there will be a particular satisfactory resolution to an admission, given that there is not a surgical fix for this particular kind of fracture.  They said that they understand and that we will try to make it work at home.  They will contact the patient's primary care provider to discuss possible home assistance options.  She has a walker at home which she will utilize.  I reviewed the [place] controlled substance database and there are no concerning prescribing habits, and she said the tramadol has worked well for her in the past.  I gave my usual follow-up recommendations and return precautions. [CF]    Clinical Course User Index [CF] Loleta Rose, MD     Medications  traMADol Janean Sark) tablet 100 mg (has no administration in time range)  acetaminophen (TYLENOL) tablet 1,000 mg (1,000 mg Oral Given 12/20/22 1921)  morphine (PF) 2 MG/ML injection 2 mg (2 mg Intramuscular Given 12/20/22 2336)     ED Discharge Orders          Ordered    traMADol (ULTRAM) 50 MG tablet  Every 8 hours PRN        12/21/22 0214            Final diagnoses:  Fall, initial encounter  Closed fracture of right inferior pubic ramus, initial encounter (HCC)     Loleta Rose, MD 12/21/22 [phone]

## 2022-12-20 NOTE — ED Triage Notes (Signed)
First Nurse Note;  Pt via [facility] from home. Pt had a mechanical fall. + Head injury and R hip pain. States she does take blood thinner. Pt is A&Ox4 and NAD.  154/74 BP   71 HR  96% on RA  160 CBG

## 2022-12-20 NOTE — ED Triage Notes (Addendum)
See first nurse note, reports fall today while trying to pick something off floor. C/o pain to lower back pain Reports did hit head, denies LOC. Unsure of blood thinner use

## 2022-12-21 DIAGNOSIS — S32591A Other specified fracture of right pubis, initial encounter for closed fracture: Secondary | ICD-10-CM | POA: Diagnosis not present

## 2022-12-21 MED ORDER — TRAMADOL HCL 50 MG PO TABS: 100.0000 mg | ORAL_TABLET | Freq: Three times a day (TID) | ORAL | 0 refills | Status: AC | PRN

## 2022-12-21 MED ORDER — TRAMADOL HCL 50 MG PO TABS
100.0000 mg | ORAL_TABLET | Freq: Once | ORAL | Status: AC
  Administered 2022-12-21: 100 mg via ORAL
  Filled 2022-12-21: qty 2

## 2022-12-21 NOTE — Discharge Instructions (Signed)
As we discussed, you have a small nondisplaced fracture in your pelvis (right inferior pubic ramus).  This is nonsurgical and will heal over time but will be quite painful in the meantime.  Please use your walker and take over-the-counter pain medication as indicated on the label instructions.  Take Tramadol as prescribed for severe pain. Do not drink alcohol, drive or participate in any other potentially dangerous activities while taking this medication as it may make you sleepy. Do not take this medication with any other sedating medications, either prescription or over-the-counter. If you were prescribed Percocet or Vicodin, do not take these with acetaminophen (Tylenol) as it is already contained within these medications.   This medication is an opiate (or narcotic) pain medication and can be habit forming.  Use it as little as possible to achieve adequate pain control.  Do not use or use it with extreme caution if you have a history of opiate abuse or dependence.  If you are on a pain contract with your primary care doctor or a pain specialist, be sure to let them know you were prescribed this medication today from the [facility] Emergency Department.  This medication is intended for your use only - do not give any to anyone else and keep it in a secure place where nobody else, especially children, have access to it.  It will also cause or worsen constipation, so you may want to consider taking an over-the-counter stool softener while you are taking this medication.    Return to the emergency department if you develop new or worsening symptoms that concern you.

## 2023-01-07 ENCOUNTER — Ambulatory Visit
Admission: RE | Admit: 2023-01-07 | Discharge: 2023-01-07 | Disposition: A | Discharge status: Home / Self Care | Payer: Medicare Other | Source: Ambulatory Visit | Attending: Orthopedic Surgery | Admitting: Orthopedic Surgery

## 2023-01-07 ENCOUNTER — Other Ambulatory Visit: Payer: Self-pay | Admitting: Orthopedic Surgery

## 2023-01-07 DIAGNOSIS — S32591A Other specified fracture of right pubis, initial encounter for closed fracture: Secondary | ICD-10-CM

## 2023-01-07 DIAGNOSIS — M25551 Pain in right hip: Secondary | ICD-10-CM | POA: Insufficient documentation

## 2023-07-03 ENCOUNTER — Ambulatory Visit: Admission: EM | Admit: 2023-07-03 | Discharge: 2023-07-03 | Disposition: A | Discharge status: Home / Self Care

## 2023-07-03 DIAGNOSIS — L03115 Cellulitis of right lower limb: Secondary | ICD-10-CM

## 2023-07-03 MED ORDER — DOXYCYCLINE HYCLATE 100 MG PO CAPS
100.0000 mg | ORAL_CAPSULE | Freq: Two times a day (BID) | ORAL | 0 refills | Status: AC
Start: 2023-07-03 — End: ?

## 2023-07-03 NOTE — ED Provider Notes (Signed)
 Adriana Sanders    CSN: [id number] Arrival date & time: 07/03/23  4098      History   Chief Complaint Chief Complaint  Patient presents with   Foot Pain    HPI Adriana Sanders is a 83 y.o. female.   Patient complains of redness and swelling to her right foot.  Patient reports that she does not recall any injury.  Patient states that she has not been bitten by anything.  Patient denies any fever or chills.  Patient has not had any pain.  She states she noticed the area today patient's daughter called the primary care doctor who advised that she come to urgent care for evaluation.  Patient does have a past medical history of diabetes.  The history is provided by the patient and a relative. No language interpreter was used.  Foot Pain    Past Medical History:  Diagnosis Date   Anxiety    Arthritis    both knees   Cancer (HCC)    basal cell of skin, thyroid cancer.  had radiation   Depression    Diabetes mellitus without complication (HCC)    Dysrhythmia    skips a beat   Hyperlipemia    Hypertension    Hyperthyroidism    Neuromuscular disorder (HCC)    Panic disorder    Parkinsonism (HCC)    Stroke (HCC) 2001    Patient Active Problem List   Diagnosis Date Noted   History of stroke 12/13/2021   Thyrotoxicosis, unspecified without thyrotoxic crisis or storm 07/24/2021   Type 2 diabetes mellitus with hyperglycemia (HCC) 07/24/2021   Personal history of urinary (tract) infections 07/24/2021   Panic disorder (episodic paroxysmal anxiety) 07/24/2021   Seasonal allergic rhinitis 07/24/2021   S/P TKR (total knee replacement) using cement, left 07/18/2021   Unspecified osteoarthritis, unspecified site 07/18/2021   Presence of artificial knee joint, bilateral 07/18/2021   Thyroid disease 11/01/2020   Encephalopathy 05/30/2020   S/P TKR (total knee replacement) using cement, right 05/22/2020   Personal history of nicotine  dependence 03/03/2020   Leg pain 01/14/2019    PAD (peripheral artery disease) (HCC) 01/14/2019   Type 2 diabetes mellitus with diabetic peripheral angiopathy without gangrene (HCC) 01/14/2019   Anxiety 01/10/2019   Depression 01/10/2019   Diabetes mellitus type 2, uncomplicated (HCC) 01/10/2019   Hyperlipidemia 01/10/2019   Hypertension 01/10/2019   Panic disorder 01/10/2019   Stroke (HCC) 01/10/2019   Parkinson disease (HCC) 01/10/2019   Personal history of malignant neoplasm of thyroid 03/03/2018   Personal history of irradiation 03/03/2018   S/P hip hemiarthroplasty 06/13/2017   Age-related osteoporosis with current pathological fracture with routine healing 04/27/2017   Presence of left artificial hip joint 04/23/2017   Personal history of (healed) osteoporosis fracture 04/23/2017   Closed left hip fracture (HCC) 04/22/2017   Idiopathic osteoarthritis 01/09/2014   Aftercare following joint replacement surgery 08/04/2000    Past Surgical History:  Procedure Laterality Date   ANTERIOR APPROACH HEMI HIP ARTHROPLASTY Left 04/23/2017   Procedure: ANTERIOR APPROACH HEMI HIP ARTHROPLASTY;  Surgeon: Molli Angelucci, MD;  Location: [facility];  Service: Orthopedics;  Laterality: Left;   EYE SURGERY Bilateral    cataract extractions   TOTAL KNEE ARTHROPLASTY Right 05/22/2020   Procedure: TOTAL KNEE ARTHROPLASTY - Thomos Flies to Assist;  Surgeon: Molli Angelucci, MD;  Location: [facility];  Service: Orthopedics;  Laterality: Right;   TOTAL KNEE ARTHROPLASTY Left 07/18/2021   Procedure: TOTAL KNEE ARTHROPLASTY;  Surgeon: Molli Angelucci, MD;  Location: [facility];  Service: Orthopedics;  Laterality: Left;    OB History   No obstetric history on file.      Home Medications    Prior to Admission medications   Medication Sig Start Date End Date Taking? Authorizing Provider  clopidogrel (PLAVIX) 75 MG tablet Take 1 tablet by mouth daily. 05/18/23  Yes [provider]  doxycycline (VIBRAMYCIN) 100 MG capsule Take 1 capsule (100 mg  total) by mouth 2 (two) times daily. 07/03/23  Yes Anjelo Pullman K, PA-C  acetaminophen  (TYLENOL ) 500 MG tablet Take 2 tablets (1,000 mg total) by mouth every 6 (six) hours. 07/20/21   Coralyn Derry, PA-C  ALPRAZolam  (XANAX ) 1 MG tablet Take 1 mg by mouth daily as needed for anxiety. 04/13/17   [provider]  ALPRAZolam  (XANAX ) 1 MG tablet Take 1 tablet by mouth daily as needed. 08/23/21   [provider]  ARIPiprazole (ABILIFY) 2 MG tablet Take 2 mg by mouth daily.    [provider]  aspirin  81 MG chewable tablet Chew 1 tablet (81 mg total) by mouth 2 (two) times daily. Patient not taking: Reported on 07/08/2021 05/24/20   Coralyn Derry, PA-C  atorvastatin  (LIPITOR) 10 MG tablet Take 10 mg by mouth daily. 02/13/17   [provider]  bisacodyl  (DULCOLAX) 5 MG EC tablet Take 1 tablet (5 mg total) by mouth daily as needed for moderate constipation. Patient not taking: Reported on 07/03/2021 04/24/17   Coralyn Derry, PA-C  carbidopa -levodopa  (SINEMET  IR) 25-100 MG tablet Take 1 tablet by mouth 3 (three) times daily. 12/15/18 07/08/21  [provider]  Cholecalciferol  4000 units CAPS Take 4,000 Units by mouth daily.    [provider]  diclofenac Sodium (VOLTAREN) 1 % GEL Apply 4 g topically daily as needed for pain. 01/24/21   [provider]  dipyridamole -aspirin  (AGGRENOX ) 200-25 MG 12hr capsule Take 1 capsule by mouth 2 (two) times daily. 09/01/18   [provider]  enoxaparin  (LOVENOX ) 40 MG/0.4ML injection Inject 0.4 mLs (40 mg total) into the skin daily for 14 days. Patient not taking: Reported on 07/03/2023 07/20/21 08/03/21  Gaines, Thomas C, PA-C  ferrous fumarate-b12-vitamic C-folic acid (TRINSICON / FOLTRIN) capsule Take 1 capsule by mouth 2 (two) times daily. 07/20/21   Coralyn Derry, PA-C  fexofenadine (ALLEGRA) 180 MG tablet Take 180 mg by mouth daily.    [provider]  FLUoxetine  (PROZAC ) 20 MG capsule Take 60  mg by mouth daily. 03/11/21   [provider]  FLUoxetine  (PROZAC ) 20 MG capsule Take 3 capsules by mouth daily. 09/23/21   [provider]  FLUoxetine  (PROZAC ) 40 MG capsule Take 40 mg by mouth daily. 02/13/17   [provider]  fluticasone  (FLONASE ) 50 MCG/ACT nasal spray Place 2 sprays into both nostrils daily.    [provider]  lisinopril -hydrochlorothiazide  (PRINZIDE ,ZESTORETIC ) 20-25 MG tablet Take 1 tablet by mouth daily.    [provider]  metFORMIN  (GLUCOPHAGE ) 500 MG tablet Take 500 mg by mouth 2 (two) times daily with a meal.    [provider]  metFORMIN  (GLUCOPHAGE ) 500 MG tablet Take 1 tablet by mouth 2 (two) times daily. 10/28/21   [provider]  methimazole  (TAPAZOLE ) 10 MG tablet Take 10 mg by mouth daily.    [provider]  methocarbamol  (ROBAXIN ) 500 MG tablet Take 1 tablet (500 mg total) by mouth every 6 (six) hours as needed for muscle spasms. 07/20/21   Francenia Ingle  C, PA-C  methocarbamol  (ROBAXIN ) 500 MG tablet Take by mouth. 07/20/21   [provider]  metoprolol  tartrate (LOPRESSOR ) 100 MG tablet Take 100 mg by mouth 2 (two) times daily with a meal. 02/13/17   [provider]  oxyCODONE  (OXY IR/ROXICODONE ) 5 MG immediate release tablet Take 0.5-1 tablets (2.5-5 mg total) by mouth every 6 (six) hours as needed for moderate pain (pain score 4-6). 07/20/21   Coralyn Derry, PA-C  pioglitazone  (ACTOS ) 15 MG tablet Take 15 mg by mouth daily.    [provider]  Polyethyl Glycol-Propyl Glycol (SYSTANE OP) Place 1 drop into both eyes daily.    [provider]  traMADol  (ULTRAM ) 50 MG tablet Take 2 tablets (100 mg total) by mouth every 8 (eight) hours as needed for moderate pain (pain score 4-6) or severe pain (pain score 7-10). 12/21/22   Lynnda Sas, MD  traZODone  (DESYREL ) 100 MG tablet Take 100 mg by mouth at bedtime. 03/26/21   [provider]  vitamin B-12  (CYANOCOBALAMIN ) 500 MCG tablet Take 500 mcg by mouth daily.    [provider]    Family History History reviewed. No pertinent family history.  Social History Social History   Tobacco Use   Smoking status: Former    Current packs/day: 0.00    Types: Cigarettes    Quit date: 2019    Years since quitting: 6.3   Smokeless tobacco: Never  Vaping Use   Vaping status: Never Used  Substance Use Topics   Alcohol  use: No   Drug use: No     Allergies   Vicodin [hydrocodone-acetaminophen ]   Review of Systems Review of Systems  All other systems reviewed and are negative.    Physical Exam Triage Vital Signs ED Triage Vitals [07/03/23 1005]  Encounter Vitals Group     BP      Systolic BP Percentile      Diastolic BP Percentile      Pulse      Resp      Temp      Temp src      SpO2      Weight      Height      Head Circumference      Peak Flow      Pain Score 0     Pain Loc      Pain Education      Exclude from Growth Chart    No data found.  Updated Vital Signs There were no vitals taken for this visit.  Visual Acuity Right Eye Distance:   Left Eye Distance:   Bilateral Distance:    Right Eye Near:   Left Eye Near:    Bilateral Near:     Physical Exam Vitals and nursing note reviewed.  Constitutional:      Appearance: She is well-developed.  HENT:     Head: Normocephalic.  Cardiovascular:     Rate and Rhythm: Normal rate.  Pulmonary:     Effort: Pulmonary effort is normal.  Abdominal:     General: There is no distension.  Musculoskeletal:        General: Swelling and tenderness present.     Cervical back: Normal range of motion.     Comments: Swelling dorsal area right foot, nontender full range of motion calf is nontender negative Homans neurovascular and neurosensory are intact.  Skin:    General: Skin is warm.  Neurological:     General: No focal deficit present.  Mental Status: She is alert and oriented to person, place,  and time.      UC Treatments / Results  Labs (all labs ordered are listed, but only abnormal results are displayed) Labs Reviewed - No data to display  EKG   Radiology No results found.  Procedures Procedures (including critical care time)  Medications Ordered in UC Medications - No data to display  Initial Impression / Assessment and Plan / UC Course  I have reviewed the triage vital signs and the nursing notes.  Pertinent labs & imaging results that were available during my care of the patient were reviewed by me and considered in my medical decision making (see chart for details).     Patient denies any bites she denies any injury.  She does not have any history of gout.  This may be early cellulitis.  I will treat the patient with doxycycline.  She is counseled on the need for follow-up Final Clinical Impressions(s) / UC Diagnoses   Final diagnoses:  Cellulitis of foot, right     Discharge Instructions      Take antibiotic as directed.  Elevate your foot.  Go to the emergency department if increased redness swelling pain or fever.  See your physician for recheck next week.  Return if any problems   ED Prescriptions     Medication Sig Dispense Auth. Provider   doxycycline (VIBRAMYCIN) 100 MG capsule Take 1 capsule (100 mg total) by mouth 2 (two) times daily. 20 capsule Jader Desai K, PA-C      PDMP not reviewed this encounter. An After Visit Summary was printed and given to the patient.       Sandi Crosby, PA-C 07/03/23 1024

## 2023-07-03 NOTE — ED Triage Notes (Signed)
 Patient to Urgent Care with complaints of right sided foot redness/ swelling.   Denies any injury. Reports she woke up with symptoms.   Dose of tylenol  PTA.

## 2023-07-03 NOTE — Discharge Instructions (Addendum)
 Take antibiotic as directed.  Elevate your foot.  Go to the emergency department if increased redness swelling pain or fever.  See your physician for recheck next week.  Return if any problems

## 2024-02-23 IMAGING — DX DG KNEE 1-2V*L*
2 series · 2 of 2 positions shown · non-contrast
Comparison: None Available.

CLINICAL DATA: Post knee replacement

EXAM:
LEFT KNEE - 1-2 VIEW

[knee ap]
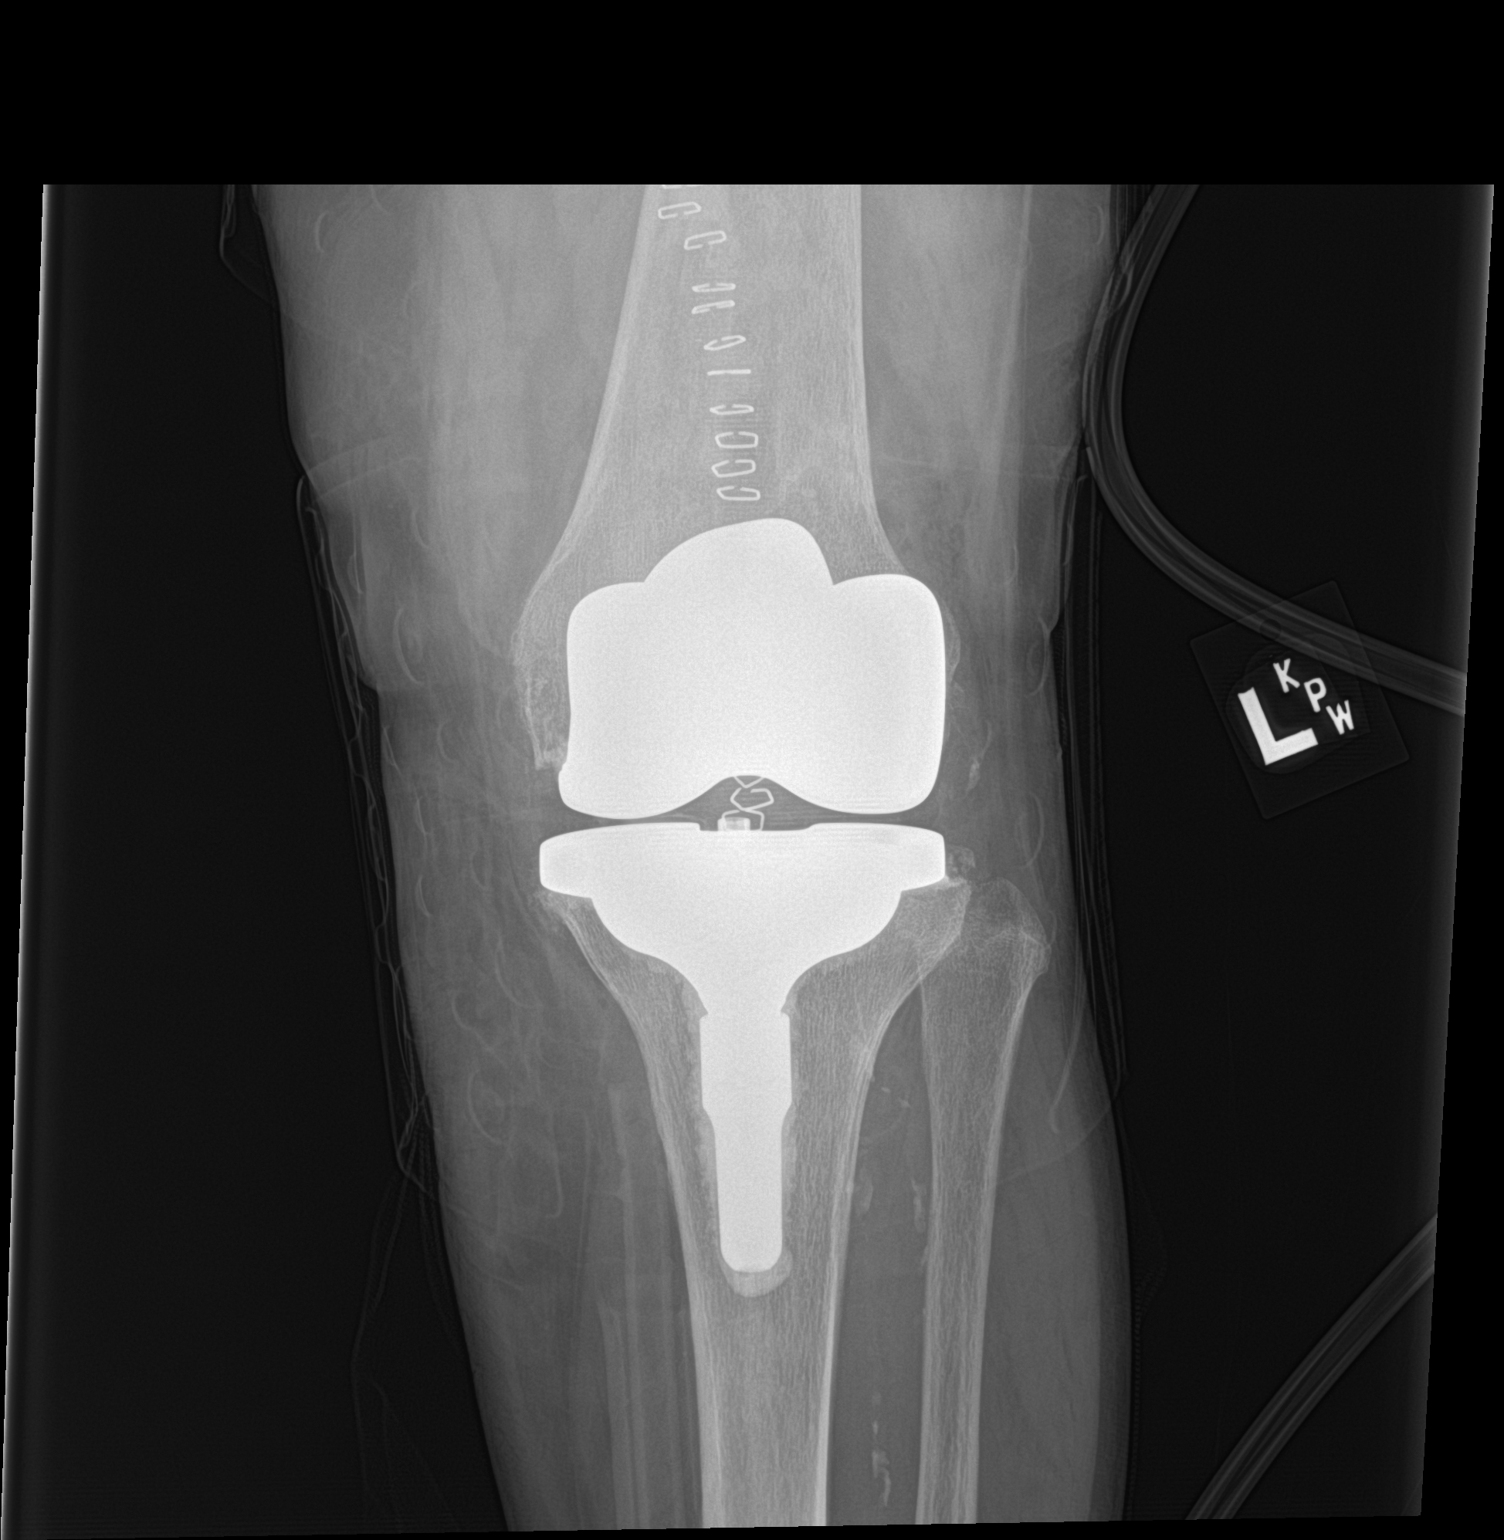

[knee lat]
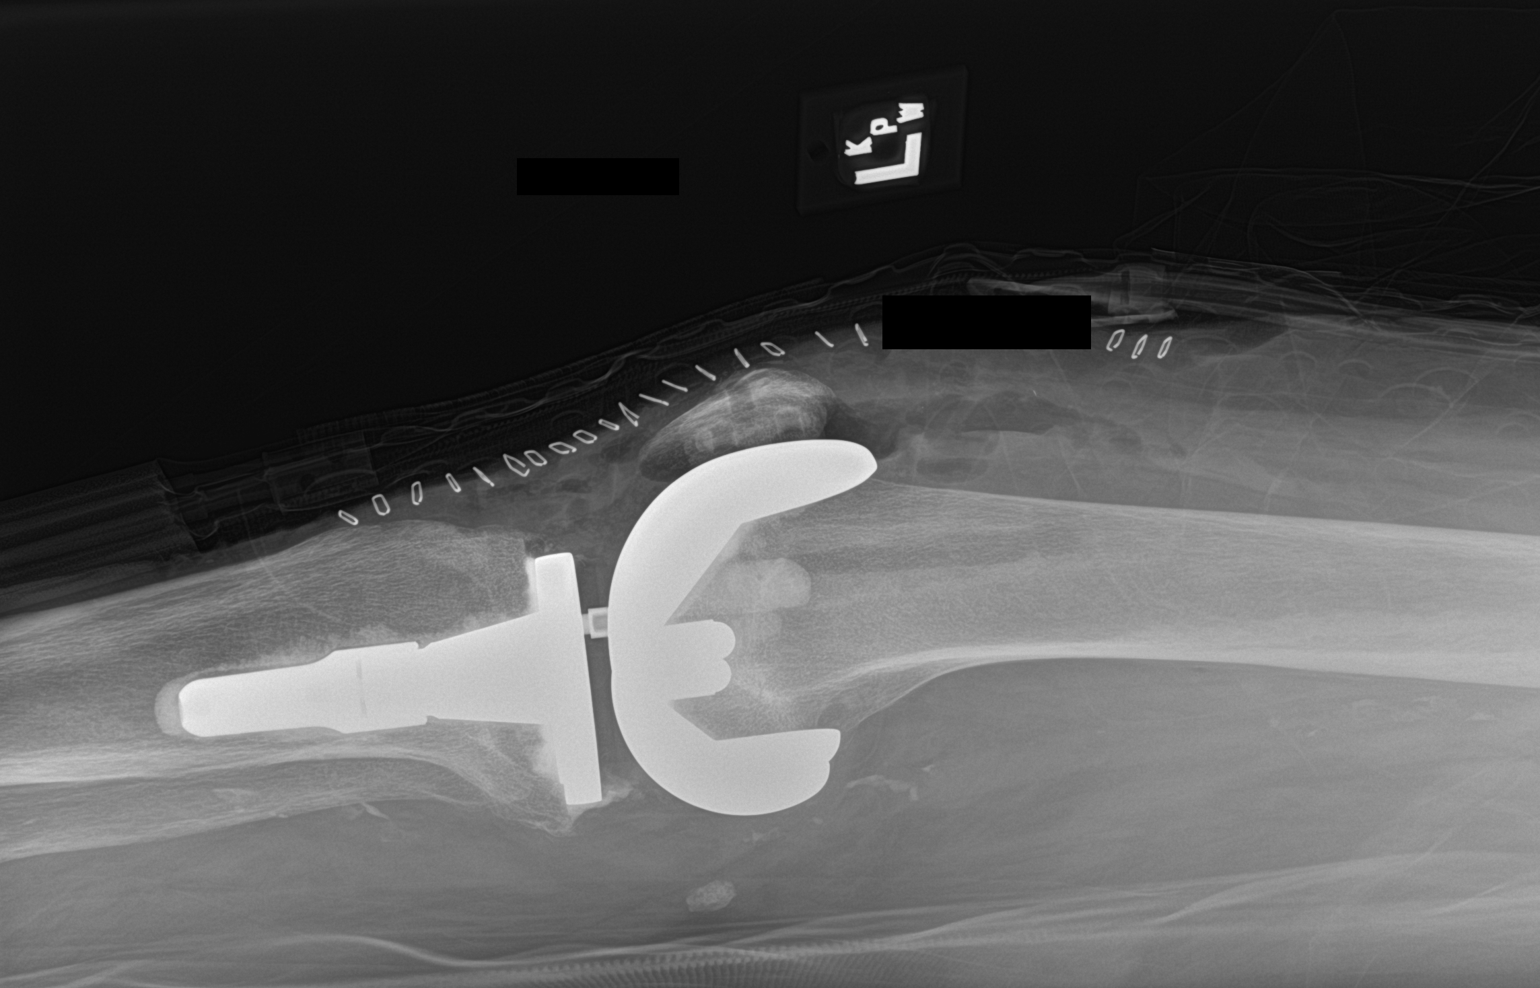

[2 of 2 positions shown; findings below may reference images not displayed]

FINDINGS: Postoperative changes of left total knee arthroplasty. Postoperative
soft tissue swelling and air noted.
IMPRESSION: Standard postoperative appearance of left total knee arthroplasty.
